# Patient Record
Sex: Female | Born: 1935 | Race: White | Hispanic: No | Smoking: Former smoker
Health system: Southern US, Community
[De-identification: ages and names within clinical notes are randomized; demographics above are authoritative.]

## PROBLEM LIST (undated history)

## (undated) DIAGNOSIS — M1990 Unspecified osteoarthritis, unspecified site: Secondary | ICD-10-CM

## (undated) DIAGNOSIS — N390 Urinary tract infection, site not specified: Secondary | ICD-10-CM

## (undated) DIAGNOSIS — M1710 Unilateral primary osteoarthritis, unspecified knee: Secondary | ICD-10-CM

## (undated) DIAGNOSIS — I10 Essential (primary) hypertension: Secondary | ICD-10-CM

## (undated) DIAGNOSIS — M21069 Valgus deformity, not elsewhere classified, unspecified knee: Secondary | ICD-10-CM

## (undated) DIAGNOSIS — S4292XA Fracture of left shoulder girdle, part unspecified, initial encounter for closed fracture: Secondary | ICD-10-CM

## (undated) DIAGNOSIS — N189 Chronic kidney disease, unspecified: Secondary | ICD-10-CM

## (undated) DIAGNOSIS — K8020 Calculus of gallbladder without cholecystitis without obstruction: Secondary | ICD-10-CM

## (undated) DIAGNOSIS — N950 Postmenopausal bleeding: Secondary | ICD-10-CM

## (undated) DIAGNOSIS — H269 Unspecified cataract: Secondary | ICD-10-CM

## (undated) DIAGNOSIS — M25562 Pain in left knee: Secondary | ICD-10-CM

## (undated) DIAGNOSIS — M810 Age-related osteoporosis without current pathological fracture: Secondary | ICD-10-CM

## (undated) DIAGNOSIS — N151 Renal and perinephric abscess: Secondary | ICD-10-CM

## (undated) DIAGNOSIS — G459 Transient cerebral ischemic attack, unspecified: Secondary | ICD-10-CM

## (undated) DIAGNOSIS — N840 Polyp of corpus uteri: Secondary | ICD-10-CM

## (undated) DIAGNOSIS — J069 Acute upper respiratory infection, unspecified: Secondary | ICD-10-CM

## (undated) DIAGNOSIS — N201 Calculus of ureter: Secondary | ICD-10-CM

## (undated) DIAGNOSIS — E785 Hyperlipidemia, unspecified: Secondary | ICD-10-CM

## (undated) DIAGNOSIS — T7840XA Allergy, unspecified, initial encounter: Secondary | ICD-10-CM

## (undated) HISTORY — DX: Chronic kidney disease, unspecified: N18.9

## (undated) HISTORY — PX: OTHER SURGICAL HISTORY: SHX169

## (undated) HISTORY — DX: Unilateral primary osteoarthritis, unspecified knee: M17.10

## (undated) HISTORY — DX: Renal and perinephric abscess: N15.1

## (undated) HISTORY — DX: Urinary tract infection, site not specified: N39.0

## (undated) HISTORY — DX: Calculus of ureter: N20.1

## (undated) HISTORY — DX: Calculus of gallbladder without cholecystitis without obstruction: K80.20

## (undated) HISTORY — DX: Essential (primary) hypertension: I10

## (undated) HISTORY — DX: Transient cerebral ischemic attack, unspecified: G45.9

## (undated) HISTORY — DX: Age-related osteoporosis without current pathological fracture: M81.0

## (undated) HISTORY — DX: Unspecified cataract: H26.9

## (undated) HISTORY — DX: Acute upper respiratory infection, unspecified: J06.9

## (undated) HISTORY — DX: Allergy, unspecified, initial encounter: T78.40XA

## (undated) HISTORY — DX: Hyperlipidemia, unspecified: E78.5

## (undated) HISTORY — DX: Postmenopausal bleeding: N95.0

## (undated) HISTORY — PX: EYE SURGERY: SHX253

## (undated) HISTORY — DX: Polyp of corpus uteri: N84.0

## (undated) HISTORY — DX: Unspecified osteoarthritis, unspecified site: M19.90

## (undated) HISTORY — PX: HERNIA REPAIR: SHX51

## (undated) HISTORY — DX: Valgus deformity, not elsewhere classified, unspecified knee: M21.069

## (undated) HISTORY — DX: Pain in left knee: M25.562

## (undated) HISTORY — DX: Fracture of left shoulder girdle, part unspecified, initial encounter for closed fracture: S42.92XA

---

## 1980-11-20 HISTORY — PX: OTHER SURGICAL HISTORY: SHX169

## 1981-11-20 HISTORY — PX: INCISIONAL HERNIA REPAIR: SHX193

## 1996-12-21 HISTORY — PX: OTHER SURGICAL HISTORY: SHX169

## 1997-05-20 DIAGNOSIS — N840 Polyp of corpus uteri: Secondary | ICD-10-CM | POA: Insufficient documentation

## 1997-05-20 HISTORY — DX: Polyp of corpus uteri: N84.0

## 1998-04-07 ENCOUNTER — Ambulatory Visit (HOSPITAL_COMMUNITY): Admission: RE | Admit: 1998-04-07 | Discharge: 1998-04-07 | Payer: Self-pay | Admitting: Obstetrics & Gynecology

## 1998-05-20 HISTORY — PX: OTHER SURGICAL HISTORY: SHX169

## 1998-08-20 HISTORY — PX: OTHER SURGICAL HISTORY: SHX169

## 1998-10-01 ENCOUNTER — Other Ambulatory Visit: Admission: RE | Admit: 1998-10-01 | Discharge: 1998-10-01 | Payer: Self-pay | Admitting: Family Medicine

## 2001-12-25 ENCOUNTER — Encounter: Admission: RE | Admit: 2001-12-25 | Discharge: 2001-12-25 | Payer: Self-pay | Admitting: Surgery

## 2001-12-25 ENCOUNTER — Encounter: Payer: Self-pay | Admitting: Surgery

## 2002-02-05 ENCOUNTER — Ambulatory Visit (HOSPITAL_COMMUNITY): Admission: RE | Admit: 2002-02-05 | Discharge: 2002-02-05 | Payer: Self-pay | Admitting: Surgery

## 2002-02-10 ENCOUNTER — Encounter: Payer: Self-pay | Admitting: Surgery

## 2002-02-18 ENCOUNTER — Inpatient Hospital Stay (HOSPITAL_COMMUNITY): Admission: EM | Admit: 2002-02-18 | Discharge: 2002-02-19 | Payer: Self-pay | Admitting: Surgery

## 2006-06-14 ENCOUNTER — Other Ambulatory Visit: Admission: RE | Admit: 2006-06-14 | Discharge: 2006-06-14 | Payer: Self-pay | Admitting: Family Medicine

## 2011-04-18 ENCOUNTER — Encounter: Payer: Self-pay | Admitting: Physician Assistant

## 2013-02-28 ENCOUNTER — Encounter: Payer: Self-pay | Admitting: Family Medicine

## 2013-02-28 ENCOUNTER — Ambulatory Visit (INDEPENDENT_AMBULATORY_CARE_PROVIDER_SITE_OTHER): Payer: Medicare Other | Admitting: Family Medicine

## 2013-02-28 VITALS — BP 139/73 | HR 62 | Temp 98.6°F | Ht 61.0 in | Wt 159.2 lb

## 2013-02-28 DIAGNOSIS — M810 Age-related osteoporosis without current pathological fracture: Secondary | ICD-10-CM | POA: Insufficient documentation

## 2013-02-28 DIAGNOSIS — M1710 Unilateral primary osteoarthritis, unspecified knee: Secondary | ICD-10-CM

## 2013-02-28 DIAGNOSIS — E785 Hyperlipidemia, unspecified: Secondary | ICD-10-CM

## 2013-02-28 DIAGNOSIS — M1712 Unilateral primary osteoarthritis, left knee: Secondary | ICD-10-CM | POA: Insufficient documentation

## 2013-02-28 DIAGNOSIS — M109 Gout, unspecified: Secondary | ICD-10-CM | POA: Insufficient documentation

## 2013-02-28 DIAGNOSIS — M21069 Valgus deformity, not elsewhere classified, unspecified knee: Secondary | ICD-10-CM

## 2013-02-28 DIAGNOSIS — I10 Essential (primary) hypertension: Secondary | ICD-10-CM

## 2013-02-28 LAB — LIPID PANEL
Cholesterol: 144 mg/dL (ref 0–200)
HDL: 47 mg/dL (ref >39)
LDL Cholesterol: 80 mg/dL (ref 0–99)
Total CHOL/HDL Ratio: 3.1 Ratio
Triglycerides: 84 mg/dL (ref <150)
VLDL: 17 mg/dL (ref 0–40)

## 2013-02-28 LAB — COMPLETE METABOLIC PANEL WITH GFR
ALT: 13 U/L (ref 0–35)
AST: 23 U/L (ref 0–37)
Albumin: 3.8 g/dL (ref 3.5–5.2)
Alkaline Phosphatase: 66 U/L (ref 39–117)
BUN: 29 mg/dL — ABNORMAL HIGH (ref 6–23)
CO2: 31 mEq/L (ref 19–32)
Calcium: 9.7 mg/dL (ref 8.4–10.5)
Chloride: 103 mEq/L (ref 96–112)
Creat: 0.92 mg/dL (ref 0.50–1.10)
GFR, Est African American: 64 mL/min
GFR, Est Non African American: 55 mL/min — ABNORMAL LOW
Glucose, Bld: 91 mg/dL (ref 70–99)
Potassium: 4.2 mEq/L (ref 3.5–5.3)
Sodium: 142 mEq/L (ref 135–145)
Total Bilirubin: 1 mg/dL (ref 0.3–1.2)
Total Protein: 6.6 g/dL (ref 6.0–8.3)

## 2013-02-28 LAB — URIC ACID: Uric Acid, Serum: 5.3 mg/dL (ref 2.4–6.0)

## 2013-02-28 MED ORDER — MELOXICAM 15 MG PO TABS: 15.0000 mg | ORAL_TABLET | Freq: Every day | ORAL | Status: DC

## 2013-02-28 MED ORDER — ALLOPURINOL 100 MG PO TABS: 100.0000 mg | ORAL_TABLET | Freq: Every day | ORAL | Status: DC

## 2013-02-28 MED ORDER — BENAZEPRIL HCL 40 MG PO TABS: 40.0000 mg | ORAL_TABLET | Freq: Every day | ORAL | Status: DC

## 2013-02-28 MED ORDER — ATENOLOL 50 MG PO TABS: 50.0000 mg | ORAL_TABLET | Freq: Two times a day (BID) | ORAL | Status: DC

## 2013-02-28 MED ORDER — LOVASTATIN 20 MG PO TABS: 20.0000 mg | ORAL_TABLET | Freq: Every day | ORAL | Status: DC

## 2013-02-28 MED ORDER — FUROSEMIDE 40 MG PO TABS: 40.0000 mg | ORAL_TABLET | Freq: Every day | ORAL | Status: DC

## 2013-02-28 MED ORDER — RALOXIFENE HCL 60 MG PO TABS: 60.0000 mg | ORAL_TABLET | Freq: Every day | ORAL | Status: DC

## 2013-02-28 MED ORDER — AMLODIPINE BESYLATE 5 MG PO TABS: 5.0000 mg | ORAL_TABLET | Freq: Every day | ORAL | Status: DC

## 2013-02-28 NOTE — Progress Notes (Signed)
Patient ID: Paige Ferguson, female   DOB: [birth year], 79 y.o.   MRN: [id number] SUBJECTIVE:   HPI:  Patient is here for follow up of hypertension: deniesHeadache;deniesChest Pain;deniesweakness;deniesShortness of Breath or Orthopnea;deniesVisual changes;deniespalpitations;deniescough;admits topedal edema , this is dependent edema. Uses compression stockings.;deniessymptoms of TIA or stroke; admits toCompliance with medications. deniesProblems with medications.  PMH/PSH: reviewed/updated in Epic  SH/FH: reviewed/updated in Epic  Allergies: reviewed/updated in Epic  Medications: reviewed/updated in Epic  Immunizations: reviewed/updated in Epic  ROS: As above in the HPI. All other systems are stable or negative.  OBJECTIVE:    APPEARANCE: Elderly, short statured white female Patient in no acute distress.The patient appeared well nourished and normally developed. Acyanotic.  Waist: Obese  VITAL SIGNS:BP 139/73  Pulse 62  Temp(Src) 98.6 F (37 C) (Oral)  Ht 5\' 1"  (1.549 m)  Wt 159 lb 3.2 oz (72.213 kg)  BMI 30.1 kg/m2   SKIN: warm and  Dry without overt rashes, tattoos and scars  HEAD and Neck: without JVD, Normal, carotids no bruits No scleral icterus  CHEST & LUNGS: Clear  CVS: Reveals the PMI to be normally located. Regular rhythm, First and Second Heart sounds are normal, and absence of murmurs, rubs or gallops.  ABDOMEN:  Benign,, no organomegaly, no masses, no Abdominal Aortic enlargement. No Guarding , no rebound. No Bruits.  RECTAL:n/a  GU:n/a  EXTREMETIES: 1+edematous. Wears compression stockings   MUSCULOSKELETAL:  Spine: Decreased range of motion of the spine Left knee has a knee brace on. Left knee genu valgum.  NEUROLOGIC: oriented to time,place and person; nonfocal. Ambulates with a walker  ASSESSMENT:  HTN (hypertension) - Plan: atenolol (TENORMIN) 50 MG tablet, amLODipine (NORVASC) 5 MG tablet, benazepril (LOTENSIN) 40 MG tablet, furosemide  (LASIX) 40 MG tablet, COMPLETE METABOLIC PANEL WITH GFR  Gout - Plan: allopurinol (ZYLOPRIM) 100 MG tablet, COMPLETE METABOLIC PANEL WITH GFR, Uric acid  Osteoporosis, unspecified - Plan: raloxifene (EVISTA) 60 MG tablet, COMPLETE METABOLIC PANEL WITH GFR  Arthritis of knee, left - Plan: meloxicam (MOBIC) 15 MG tablet, COMPLETE METABOLIC PANEL WITH GFR  Genu valgum (acquired) - Plan: meloxicam (MOBIC) 15 MG tablet  HLD (hyperlipidemia) - Plan: lovastatin (MEVACOR) 20 MG tablet, COMPLETE METABOLIC PANEL WITH GFR, Lipid panel    PLAN: Orders Placed This Encounter  Procedures  . COMPLETE METABOLIC PANEL WITH GFR  . Uric acid  . Lipid panel   No results found for this or any previous visit (from the past 24 hour(s)).  Meds ordered this encounter  Medications  . allopurinol (ZYLOPRIM) 100 MG tablet    Sig: Take 1 tablet (100 mg total) by mouth daily.    Dispense:  30 tablet    Refill:  6  . atenolol (TENORMIN) 50 MG tablet    Sig: Take 1 tablet (50 mg total) by mouth 2 (two) times daily.    Dispense:  60 tablet    Refill:  6  . amLODipine (NORVASC) 5 MG tablet    Sig: Take 1 tablet (5 mg total) by mouth daily.    Dispense:  30 tablet    Refill:  6  . benazepril (LOTENSIN) 40 MG tablet    Sig: Take 1 tablet (40 mg total) by mouth daily.    Dispense:  30 tablet    Refill:  6  . furosemide (LASIX) 40 MG tablet    Sig: Take 1 tablet (40 mg total) by mouth daily. Taken one and half tablet by muoth daily    Dispense:  30 tablet    Refill:  6  . lovastatin (MEVACOR) 20 MG tablet    Sig: Take 1 tablet (20 mg total) by mouth at bedtime.    Dispense:  30 tablet    Refill:  6  . meloxicam (MOBIC) 15 MG tablet    Sig: Take 1 tablet (15 mg total) by mouth daily.    Dispense:  30 tablet    Refill:  6  . raloxifene (EVISTA) 60 MG tablet    Sig: Take 1 tablet (60 mg total) by mouth daily.    Dispense:  30 tablet    Refill:  6   Discussed with patient her medical problems. Healthy  lifestyle and keep active, healthy diet.  Return to clinic in 3 months Continue same regimen  Layloni Fahrner P. [place] Charon, M.D.

## 2013-02-28 NOTE — Progress Notes (Signed)
Quick Note:  Lab result at goal. No change in Medications for now. No Change in plans and follow up. ______ 

## 2013-03-21 ENCOUNTER — Other Ambulatory Visit: Payer: Self-pay | Admitting: Physician Assistant

## 2013-06-05 ENCOUNTER — Ambulatory Visit (INDEPENDENT_AMBULATORY_CARE_PROVIDER_SITE_OTHER): Payer: Medicare Other | Admitting: Family Medicine

## 2013-06-05 ENCOUNTER — Encounter: Payer: Self-pay | Admitting: Family Medicine

## 2013-06-05 VITALS — BP 151/75 | HR 60 | Temp 97.4°F | Wt 161.4 lb

## 2013-06-05 DIAGNOSIS — Z905 Acquired absence of kidney: Secondary | ICD-10-CM | POA: Insufficient documentation

## 2013-06-05 DIAGNOSIS — I10 Essential (primary) hypertension: Secondary | ICD-10-CM

## 2013-06-05 DIAGNOSIS — E785 Hyperlipidemia, unspecified: Secondary | ICD-10-CM

## 2013-06-05 DIAGNOSIS — M109 Gout, unspecified: Secondary | ICD-10-CM

## 2013-06-05 DIAGNOSIS — M21069 Valgus deformity, not elsewhere classified, unspecified knee: Secondary | ICD-10-CM

## 2013-06-05 DIAGNOSIS — M810 Age-related osteoporosis without current pathological fracture: Secondary | ICD-10-CM

## 2013-06-05 DIAGNOSIS — M1710 Unilateral primary osteoarthritis, unspecified knee: Secondary | ICD-10-CM

## 2013-06-05 DIAGNOSIS — M1712 Unilateral primary osteoarthritis, left knee: Secondary | ICD-10-CM

## 2013-06-05 LAB — POCT UA - MICROSCOPIC ONLY
Bacteria, U Microscopic: NEGATIVE
Casts, Ur, LPF, POC: NEGATIVE
Crystals, Ur, HPF, POC: NEGATIVE
Mucus, UA: NEGATIVE
RBC, urine, microscopic: NEGATIVE
Yeast, UA: NEGATIVE

## 2013-06-05 LAB — COMPLETE METABOLIC PANEL WITH GFR
ALT: 13 U/L (ref 0–35)
AST: 24 U/L (ref 0–37)
Albumin: 4.1 g/dL (ref 3.5–5.2)
Alkaline Phosphatase: 69 U/L (ref 39–117)
BUN: 27 mg/dL — ABNORMAL HIGH (ref 6–23)
CO2: 33 mEq/L — ABNORMAL HIGH (ref 19–32)
Calcium: 9.8 mg/dL (ref 8.4–10.5)
Chloride: 103 mEq/L (ref 96–112)
Creat: 1.02 mg/dL (ref 0.50–1.10)
GFR, Est African American: 56 mL/min — ABNORMAL LOW
GFR, Est Non African American: 49 mL/min — ABNORMAL LOW
Glucose, Bld: 98 mg/dL (ref 70–99)
Potassium: 3.9 mEq/L (ref 3.5–5.3)
Sodium: 142 mEq/L (ref 135–145)
Total Bilirubin: 1 mg/dL (ref 0.3–1.2)
Total Protein: 7 g/dL (ref 6.0–8.3)

## 2013-06-05 LAB — LIPID PANEL
Cholesterol: 140 mg/dL (ref 0–200)
HDL: 41 mg/dL (ref >39)
LDL Cholesterol: 81 mg/dL (ref 0–99)
Total CHOL/HDL Ratio: 3.4 Ratio
Triglycerides: 90 mg/dL (ref <150)
VLDL: 18 mg/dL (ref 0–40)

## 2013-06-05 LAB — POCT URINALYSIS DIPSTICK
Bilirubin, UA: NEGATIVE
Blood, UA: NEGATIVE
Glucose, UA: NEGATIVE
Ketones, UA: NEGATIVE
Nitrite, UA: NEGATIVE
Protein, UA: NEGATIVE
Spec Grav, UA: <=1.005
Urobilinogen, UA: NEGATIVE
pH, UA: 7.5

## 2013-06-05 LAB — URIC ACID: Uric Acid, Serum: 5.1 mg/dL (ref 2.4–6.0)

## 2013-06-05 MED ORDER — FUROSEMIDE 40 MG PO TABS: 40.0000 mg | ORAL_TABLET | Freq: Every day | ORAL | Status: DC

## 2013-06-05 NOTE — Patient Instructions (Addendum)
Gout °Gout is an inflammatory condition (arthritis) caused by a buildup of uric acid crystals in the joints. Uric acid is a chemical that is normally present in the blood. Under some circumstances, uric acid can form into crystals in your joints. This causes joint redness, soreness, and swelling (inflammation). Repeat attacks are common. Over time, uric acid crystals can form into masses (tophi) near a joint, causing disfigurement. Gout is treatable and often preventable. °CAUSES  °The disease begins with elevated levels of uric acid in the blood. Uric acid is produced by your body when it breaks down a naturally found substance called purines. This also happens when you eat certain foods such as meats and fish. Causes of an elevated uric acid level include: °· Being passed down from parent to child (heredity). °· Diseases that cause increased uric acid production (obesity, psoriasis, some cancers). °· Excessive alcohol use. °· Diet, especially diets rich in meat and seafood. °· Medicines, including certain cancer-fighting drugs (chemotherapy), diuretics, and aspirin. °· Chronic kidney disease. The kidneys are no longer able to remove uric acid well. °· Problems with metabolism. °Conditions strongly associated with gout include: °· Obesity. °· High blood pressure. °· High cholesterol. °· Diabetes. °Not everyone with elevated uric acid levels gets gout. It is not understood why some people get gout and others do not. Surgery, joint injury, and eating too much of certain foods are some of the factors that can lead to gout. °SYMPTOMS  °· An attack of gout comes on quickly. It causes intense pain with redness, swelling, and warmth in a joint. °· Fever can occur. °· Often, only one joint is involved. Certain joints are more commonly involved: °· Base of the big toe. °· Knee. °· Ankle. °· Wrist. °· Finger. °Without treatment, an attack usually goes away in a few days to weeks. Between attacks, you usually will not have  symptoms, which is different from many other forms of arthritis. °DIAGNOSIS  °Your caregiver will suspect gout based on your symptoms and exam. Removal of fluid from the joint (arthrocentesis) is done to check for uric acid crystals. Your caregiver will give you a medicine that numbs the area (local anesthetic) and use a needle to remove joint fluid for exam. Gout is confirmed when uric acid crystals are seen in joint fluid, using a special microscope. Sometimes, blood, urine, and X-ray tests are also used. °TREATMENT  °There are 2 phases to gout treatment: treating the sudden onset (acute) attack and preventing attacks (prophylaxis). °Treatment of an Acute Attack °· Medicines are used. These include anti-inflammatory medicines or steroid medicines. °· An injection of steroid medicine into the affected joint is sometimes necessary. °· The painful joint is rested. Movement can worsen the arthritis. °· You may use warm or cold treatments on painful joints, depending which works best for you. °· Discuss the use of coffee, vitamin C, or cherries with your caregiver. These may be helpful treatment options. °Treatment to Prevent Attacks °After the acute attack subsides, your caregiver may advise prophylactic medicine. These medicines either help your kidneys eliminate uric acid from your body or decrease your uric acid production. You may need to stay on these medicines for a very long time. °The early phase of treatment with prophylactic medicine can be associated with an increase in acute gout attacks. For this reason, during the first few months of treatment, your caregiver may also advise you to take medicines usually used for acute gout treatment. Be sure you understand your caregiver's directions. °  You should also discuss dietary treatment with your caregiver. Certain foods such as meats and fish can increase uric acid levels. Other foods such as dairy can decrease levels. Your caregiver can give you a list of foods  to avoid. °HOME CARE INSTRUCTIONS  °· Do not take aspirin to relieve pain. This raises uric acid levels. °· Only take over-the-counter or prescription medicines for pain, discomfort, or fever as directed by your caregiver. °· Rest the joint as much as possible. When in bed, keep sheets and blankets off painful areas. °· Keep the affected joint raised (elevated). °· Use crutches if the painful joint is in your leg. °· Drink enough water and fluids to keep your urine clear or pale yellow. This helps your body get rid of uric acid. Do not drink alcoholic beverages. They slow the passage of uric acid. °· Follow your caregiver's dietary instructions. Pay careful attention to the amount of protein you eat. Your daily diet should emphasize fruits, vegetables, whole grains, and fat-free or low-fat milk products. °· Maintain a healthy body weight. °SEEK MEDICAL CARE IF:  °· You have an oral temperature above 102° F (38.9° C). °· You develop diarrhea, vomiting, or any side effects from medicines. °· You do not feel better in 24 hours, or you are getting worse. °SEEK IMMEDIATE MEDICAL CARE IF:  °· Your joint becomes suddenly more tender and you have: °· Chills. °· An oral temperature above 102° F (38.9° C), not controlled by medicine. °MAKE SURE YOU:  °· Understand these instructions. °· Will watch your condition. °· Will get help right away if you are not doing well or get worse. °Document Released: 11/03/2000 Document Revised: 01/29/2012 Document Reviewed: 02/14/2010 °[employer]® Patient Information ©2014 [employer]. ° °Purine Restricted Diet °A low-purine diet consists of foods that reduce uric acid made in your body. °INDICATIONS FOR USE  °Your caregiver may ask you to follow a low-purine diet to reduce gout flairs.  °GUIDELINES  °Avoid high-purine foods, including all alcohol, yeast extracts taken as supplements, and sauces made from meats (like gravy). Do not eat high-purine meats, including anchovies, sardines,  herring, mussels, tuna, codfish, scallops, trout, haddock, bacon, organ meats, tripe, goose, wild game, and sweetbreads.  °Grains °· Allowed/Recommended: All, except those listed to consume in moderation. °· Consume in Moderation: Oatmeal ( cup uncooked daily), wheat bran or germ (¼ cup daily), and whole grains. °Vegetables °· Allowed/Recommended: All, except those listed to consume in moderation. °· Consume in Moderation: Asparagus, cauliflower, spinach, mushrooms, and green peas (½ cup daily). °Fruit °· Allowed/Recommended: All. °· Consume in Moderation: None. °Meat and Meat Substitutes °· Allowed/Recommended: Eggs, nuts, and peanut butter. °· Consume in Moderation: Limit to 4 to 6 oz daily. Avoid high-purine meats. Lentils, peas, and dried beans (1 cup daily). °Milk °· Allowed/Recommended: All. Choose low-fat or skim when possible. °· Consume in Moderation: None. °Fats and Oils °· Allowed/Recommended: All. °· Consume in Moderation: None. °Beverages °· Allowed/Recommended: All, except those listed to avoid. °· Avoid: All alcohol. °Condiments/Miscellaneous °· Allowed/Recommended: All, except those listed to consume in moderation. °· Consume in Moderation: Bouillon and meat-based broths and soups. °Document Released: 03/03/2011 Document Revised: 01/29/2012 Document Reviewed: 03/03/2011 °[employer]® Patient Information ©2014 [employer]. ° °

## 2013-06-05 NOTE — Progress Notes (Signed)
Patient ID: Paige Ferguson, female   DOB: [birth year], 79 y.o.   MRN: [id number] SUBJECTIVE: CC: Chief Complaint  Patient presents with  . Follow-up    3 month follow up fastng  no complaints refill lasix    HPI: Patient is here for follow up of hypertension/hyperlipidemia/gout/osteoporosis: denies Headache;deniesChest Pain;denies weakness;denies Shortness of Breath or Orthopnea;denies Visual changes;denies palpitations;denies cough;denies pedal edema;denies symptoms of TIA or stroke; admits to Compliance with medications. denies Problems with medications. Knee arthritis about the same. Hand arthritis responds to putting hands in warm water. SBP usually 135 at home  Ambulates with a rolling walker. Doing well. No depression symptoms and no falls. Has to use a rolling walker.  Past Medical History  Diagnosis Date  . Recurrent UTI   . Ureteral calculi   . Hypertension   . Renal abscess, right   . Cholelithiases   . Postmenopausal bleeding   . Endometrial polyp 7/98    2 degree polyps   . Left knee pain   . Chronic kidney disease   . Osteoarthritis   . Gout   . Hyperlipidemia   . URI (upper respiratory infection)   . Arthritis of knee   . Genu valgum    Past Surgical History  Procedure Laterality Date  . Right nephrelithotomy  1982  . Incisional hernia repair  1983  . Right heminephrectomy for abscess  2/98  . Hysteroscopy , fractional d&c  7/98/  . Right cataract surgery  7/99    Dr. Jettie Pagan   . Left cataract surgery  10/99   History   Social History  . Marital Status: Widowed    Spouse Name: N/A    Number of Children: N/A  . Years of Education: N/A   Occupational History  . Not on file.   Social History Main Topics  . Smoking status: Former Smoker    Types: Cigarettes    Quit date: 11/20/1944  . Smokeless tobacco: Not on file  . Alcohol Use: No  . Drug Use: No  . Sexually Active: Not on file   Other Topics Concern  . Not on file   Social History  Narrative   WIDOWED   3 CHILDREN 1 DECEASED   Family History  Problem Relation Age of Onset  . Stroke Maternal Grandmother   . Coronary artery disease Paternal Grandfather    Current Outpatient Prescriptions on File Prior to Visit  Medication Sig Dispense Refill  . allopurinol (ZYLOPRIM) 100 MG tablet Take 1 tablet (100 mg total) by mouth daily.  30 tablet  6  . amLODipine (NORVASC) 5 MG tablet Take 1 tablet (5 mg total) by mouth daily.  30 tablet  6  . atenolol (TENORMIN) 50 MG tablet TAKE  (1)  TABLET TWICE A DAY.  60 tablet  1  . benazepril (LOTENSIN) 40 MG tablet Take 1 tablet (40 mg total) by mouth daily.  30 tablet  6  . EVISTA 60 MG tablet TAKE 1 TABLET DAILY  30 tablet  1  . furosemide (LASIX) 40 MG tablet Take 1 tablet (40 mg total) by mouth daily. Taken one and half tablet by muoth daily  30 tablet  6  . lovastatin (MEVACOR) 20 MG tablet Take 1 tablet (20 mg total) by mouth at bedtime.  30 tablet  6  . meloxicam (MOBIC) 15 MG tablet Take 1 tablet (15 mg total) by mouth daily.  30 tablet  6  . Misc. Devices (STEP N REST II WALKER) MISC by  Does not apply route daily. PT HAS RX. FOR ROLLING WALKER WITH SEAT AND HANDBRAKES .        No current facility-administered medications on file prior to visit.   Allergies  Allergen Reactions  . Amoxicillin   . Nitrofurantoin Monohyd Macro   . Sulfa Antibiotics    Immunization History  Administered Date(s) Administered  . Pneumococcal Polysaccharide 01/19/2007   Prior to Admission medications   Medication Sig Start Date End Date Taking? Authorizing Provider  allopurinol (ZYLOPRIM) 100 MG tablet Take 1 tablet (100 mg total) by mouth daily. 02/28/13  Yes Ileana Ladd, MD  amLODipine (NORVASC) 5 MG tablet Take 1 tablet (5 mg total) by mouth daily. 02/28/13  Yes Ileana Ladd, MD  atenolol (TENORMIN) 50 MG tablet TAKE  (1)  TABLET TWICE A DAY. 03/21/13  Yes Ernestina Penna, MD  benazepril (LOTENSIN) 40 MG tablet Take 1 tablet (40 mg total)  by mouth daily. 02/28/13  Yes Ileana Ladd, MD  EVISTA 60 MG tablet TAKE 1 TABLET DAILY 03/21/13  Yes Ernestina Penna, MD  fexofenadine (ALLEGRA) 30 MG tablet Take 30 mg by mouth daily. Over the counter   Yes Historical Provider, MD  furosemide (LASIX) 40 MG tablet Take 1 tablet (40 mg total) by mouth daily. Taken one and half tablet by muoth daily 02/28/13  Yes Ileana Ladd, MD  lovastatin (MEVACOR) 20 MG tablet Take 1 tablet (20 mg total) by mouth at bedtime. 02/28/13  Yes Ileana Ladd, MD  meloxicam (MOBIC) 15 MG tablet Take 1 tablet (15 mg total) by mouth daily. 02/28/13  Yes Ileana Ladd, MD  Misc. Devices (STEP N REST II WALKER) MISC by Does not apply route daily. PT HAS RX. FOR ROLLING WALKER WITH SEAT AND HANDBRAKES .    Yes Historical Provider, MD     ROS: As above in the HPI. All other systems are stable or negative.  OBJECTIVE: APPEARANCE:  Patient in no acute distress.The patient appeared well nourished and normally developed. Acyanotic. Waist: VITAL SIGNS:BP 151/75  Pulse 60  Temp(Src) 97.4 F (36.3 C) (Oral)  Wt 161 lb 6.4 oz (73.211 kg)  BMI 30.51 kg/m2 Elderly WF BP 150/75  SKIN: warm and  Dry without overt rashes, tattoos and scars  HEAD and Neck: without JVD, Head and scalp: normal Eyes:No scleral icterus. Fundi normal, eye movements normal. Ears: Auricle normal, canal normal, Tympanic membranes normal, insufflation normal. Nose: normal Throat: normal Neck & thyroid: normal  CHEST & LUNGS: Chest wall: normal Lungs: Clear  CVS: Reveals the PMI to be normally located. Regular rhythm, First and Second Heart sounds are normal,  absence of murmurs, rubs or gallops. Peripheral vasculature: Radial pulses: normal Dorsal pedis pulses: normal Posterior pulses: normal  ABDOMEN:  Appearance: obese Benign, no organomegaly, no masses, no Abdominal Aortic enlargement. No Guarding , no rebound. No Bruits. Bowel sounds: normal  RECTAL: N/A GU:  N/A  EXTREMETIES: nonedematous. Both Femoral and Pedal pulses are normal.  MUSCULOSKELETAL:  Spine: normal Joints: OA of both knees and fingers of the hands. Genu valgum both knees  NEUROLOGIC: oriented to time,place and person; nonfocal.  Psychiatry: no depression  ASSESSMENT: HTN (hypertension) - Plan: COMPLETE METABOLIC PANEL WITH GFR, furosemide (LASIX) 40 MG tablet  Osteoporosis, unspecified  Gout - Plan: Uric acid  Arthritis of knee, left  Genu valgum (acquired)  Single kidney - Plan: POCT UA - Microscopic Only, POCT urinalysis dipstick, Urine culture  HLD (hyperlipidemia) - Plan: Lipid  panel  PLAN:  Orders Placed This Encounter  Procedures  . Urine culture  . COMPLETE METABOLIC PANEL WITH GFR  . Uric acid  . Lipid panel  . POCT UA - Microscopic Only  . POCT urinalysis dipstick   Meds ordered this encounter  Medications  . DISCONTD: raloxifene (EVISTA) 60 MG tablet    Sig:   . fexofenadine (ALLEGRA) 30 MG tablet    Sig: Take 30 mg by mouth daily. Over the counter  . furosemide (LASIX) 40 MG tablet    Sig: Take 1 tablet (40 mg total) by mouth daily. Taken one and half tablet by muoth daily    Dispense:  45 tablet    Refill:  6   Low Purine diet and gout handouts in the AVS  No change in medications and activities.  Return in about 3 months (around 09/05/2013) for Recheck medical problems.  Varun Jourdan P. [place] Charon, M.D.

## 2013-06-06 LAB — URINE CULTURE: Colony Count: 50000

## 2013-06-07 ENCOUNTER — Other Ambulatory Visit: Payer: Self-pay | Admitting: Family Medicine

## 2013-06-07 DIAGNOSIS — Z905 Acquired absence of kidney: Secondary | ICD-10-CM

## 2013-06-07 NOTE — Progress Notes (Signed)
Quick Note:  Lab result at goal.except the urine appears not to be a midstream catch because it grew many different bacteria. Will need to repeat in the next couple of weeks and it needs to be a midstream clean catch. No change in Medications for now. No Change in plans and follow up otherwise. ______

## 2013-06-20 ENCOUNTER — Other Ambulatory Visit (INDEPENDENT_AMBULATORY_CARE_PROVIDER_SITE_OTHER): Payer: Medicare Other

## 2013-06-20 DIAGNOSIS — Z905 Acquired absence of kidney: Secondary | ICD-10-CM

## 2013-06-20 NOTE — Progress Notes (Signed)
Patient came in for labs only.

## 2013-06-22 LAB — URINE CULTURE: Colony Count: 45000

## 2013-06-23 ENCOUNTER — Other Ambulatory Visit: Payer: Self-pay | Admitting: Family Medicine

## 2013-06-23 ENCOUNTER — Encounter: Payer: Self-pay | Admitting: *Deleted

## 2013-06-23 DIAGNOSIS — N390 Urinary tract infection, site not specified: Secondary | ICD-10-CM

## 2013-06-23 MED ORDER — CIPROFLOXACIN HCL 250 MG PO TABS: 250.0000 mg | ORAL_TABLET | Freq: Two times a day (BID) | ORAL | Status: DC

## 2013-06-23 NOTE — Progress Notes (Signed)
Quick Note:  Labs abnormal. Urine still has a few bacteria. Will treat because she has a single kidney. Recheck UA middle of the stream in 4 weeks. Rx and lab orderes placed in EPIC. ______

## 2013-07-25 ENCOUNTER — Other Ambulatory Visit (INDEPENDENT_AMBULATORY_CARE_PROVIDER_SITE_OTHER): Payer: Medicare Other

## 2013-07-25 DIAGNOSIS — N390 Urinary tract infection, site not specified: Secondary | ICD-10-CM

## 2013-07-26 LAB — URINE CULTURE: Organism ID, Bacteria: NO GROWTH

## 2013-07-27 NOTE — Progress Notes (Signed)
Quick Note:  Call patient. Labs normal.no bacteria in urine. No change in plan. ______

## 2013-09-11 ENCOUNTER — Ambulatory Visit: Payer: Medicare Other | Admitting: Family Medicine

## 2013-09-15 ENCOUNTER — Ambulatory Visit (INDEPENDENT_AMBULATORY_CARE_PROVIDER_SITE_OTHER): Payer: Medicare Other | Admitting: Family Medicine

## 2013-09-15 ENCOUNTER — Encounter: Payer: Self-pay | Admitting: Family Medicine

## 2013-09-15 VITALS — BP 148/70 | HR 64 | Temp 97.1°F | Ht 61.0 in | Wt 154.6 lb

## 2013-09-15 DIAGNOSIS — R059 Cough, unspecified: Secondary | ICD-10-CM

## 2013-09-15 DIAGNOSIS — M21069 Valgus deformity, not elsewhere classified, unspecified knee: Secondary | ICD-10-CM

## 2013-09-15 DIAGNOSIS — J309 Allergic rhinitis, unspecified: Secondary | ICD-10-CM

## 2013-09-15 DIAGNOSIS — Z23 Encounter for immunization: Secondary | ICD-10-CM

## 2013-09-15 DIAGNOSIS — M109 Gout, unspecified: Secondary | ICD-10-CM

## 2013-09-15 DIAGNOSIS — R05 Cough: Secondary | ICD-10-CM

## 2013-09-15 DIAGNOSIS — Z905 Acquired absence of kidney: Secondary | ICD-10-CM

## 2013-09-15 DIAGNOSIS — M1712 Unilateral primary osteoarthritis, left knee: Secondary | ICD-10-CM

## 2013-09-15 DIAGNOSIS — M810 Age-related osteoporosis without current pathological fracture: Secondary | ICD-10-CM

## 2013-09-15 DIAGNOSIS — I10 Essential (primary) hypertension: Secondary | ICD-10-CM

## 2013-09-15 DIAGNOSIS — E785 Hyperlipidemia, unspecified: Secondary | ICD-10-CM

## 2013-09-15 DIAGNOSIS — J302 Other seasonal allergic rhinitis: Secondary | ICD-10-CM

## 2013-09-15 DIAGNOSIS — M1710 Unilateral primary osteoarthritis, unspecified knee: Secondary | ICD-10-CM

## 2013-09-15 MED ORDER — ATENOLOL 50 MG PO TABS: ORAL_TABLET | ORAL | Status: DC

## 2013-09-15 MED ORDER — FUROSEMIDE 40 MG PO TABS: 40.0000 mg | ORAL_TABLET | Freq: Every day | ORAL | Status: DC

## 2013-09-15 MED ORDER — ALLOPURINOL 100 MG PO TABS: 100.0000 mg | ORAL_TABLET | Freq: Every day | ORAL | Status: DC

## 2013-09-15 MED ORDER — AMLODIPINE BESYLATE 5 MG PO TABS: 5.0000 mg | ORAL_TABLET | Freq: Every day | ORAL | Status: DC

## 2013-09-15 MED ORDER — BENAZEPRIL HCL 40 MG PO TABS: 40.0000 mg | ORAL_TABLET | Freq: Every day | ORAL | Status: DC

## 2013-09-15 MED ORDER — HYDROCODONE-HOMATROPINE 5-1.5 MG/5ML PO SYRP: 5.0000 mL | ORAL_SOLUTION | Freq: Every day | ORAL | Status: DC

## 2013-09-15 MED ORDER — MELOXICAM 15 MG PO TABS: 15.0000 mg | ORAL_TABLET | Freq: Every day | ORAL | Status: DC

## 2013-09-15 MED ORDER — LOVASTATIN 20 MG PO TABS: 20.0000 mg | ORAL_TABLET | Freq: Every day | ORAL | Status: DC

## 2013-09-15 NOTE — Patient Instructions (Signed)
DASH Diet The DASH diet stands for "Dietary Approaches to Stop Hypertension." It is a healthy eating plan that has been shown to reduce high blood pressure (hypertension) in as little as 14 days, while also possibly providing other significant health benefits. These other health benefits include reducing the risk of breast cancer after menopause and reducing the risk of type 2 diabetes, heart disease, colon cancer, and stroke. Health benefits also include weight loss and slowing kidney failure in patients with chronic kidney disease.  DIET GUIDELINES  Limit salt (sodium). Your diet should contain less than 1500 mg of sodium daily.  Limit refined or processed carbohydrates. Your diet should include mostly whole grains. Desserts and added sugars should be used sparingly.  Include small amounts of heart-healthy fats. These types of fats include nuts, oils, and tub margarine. Limit saturated and trans fats. These fats have been shown to be harmful in the body. CHOOSING FOODS  The following food groups are based on a 2000 calorie diet. See your Registered Dietitian for individual calorie needs. Grains and Grain Products (6 to 8 servings daily)  Eat More Often: Whole-wheat bread, brown rice, whole-grain or wheat pasta, quinoa, popcorn without added fat or salt (air popped).  Eat Less Often: White bread, white pasta, white rice, cornbread. Vegetables (4 to 5 servings daily)  Eat More Often: Fresh, frozen, and canned vegetables. Vegetables may be raw, steamed, roasted, or grilled with a minimal amount of fat.  Eat Less Often/Avoid: Creamed or fried vegetables. Vegetables in a cheese sauce. Fruit (4 to 5 servings daily)  Eat More Often: All fresh, canned (in natural juice), or frozen fruits. Dried fruits without added sugar. One hundred percent fruit juice ( cup [237 mL] daily).  Eat Less Often: Dried fruits with added sugar. Canned fruit in light or heavy syrup. [employer], Fish, and Poultry (2  servings or less daily. One serving is 3 to 4 oz [85-114 g]).  Eat More Often: Ninety percent or leaner ground beef, tenderloin, sirloin. Round cuts of beef, chicken breast, [place] breast. All fish. Grill, bake, or broil your meat. Nothing should be fried.  Eat Less Often/Avoid: Fatty cuts of meat, [place], or chicken leg, thigh, or wing. Fried cuts of meat or fish. Dairy (2 to 3 servings)  Eat More Often: Low-fat or fat-free milk, low-fat plain or light yogurt, reduced-fat or part-skim cheese.  Eat Less Often/Avoid: Milk (whole, 2%).Whole milk yogurt. Full-fat cheeses. Nuts, Seeds, and Legumes (4 to 5 servings per week)  Eat More Often: All without added salt.  Eat Less Often/Avoid: Salted nuts and seeds, canned beans with added salt. Fats and Sweets (limited)  Eat More Often: Vegetable oils, tub margarines without trans fats, sugar-free gelatin. Mayonnaise and salad dressings.  Eat Less Often/Avoid: Coconut oils, palm oils, butter, stick margarine, cream, half and half, cookies, candy, pie. FOR MORE INFORMATION The Dash Diet Eating Plan: www.dashdiet.org Document Released: 10/26/2011 Document Revised: 01/29/2012 Document Reviewed: 10/26/2011 [facility] Patient Information 2014 [place], [place].  Fall Prevention, Elderly Falls are the leading cause of injuries, accidents, and accidental deaths in people over the age of 62. Falling is a real threat to your ability to live on your own. CAUSES   Poor eyesight or poor hearing can make you more likely to fall.   Illnesses and physical conditions can affect your strength and balance.   Poor lighting, throw rugs and pets in your home can make you more likely to trip or slip.   The side effects of  some medicines can upset your balance and lead to falling. These include medicines for depression, sleep problems, high blood pressure, diabetes, and heart conditions.  PREVENTION  Be sure your home is as safe as possible. Here are some  tips:  Wear shoes with non-skid soles (not house slippers).   Be sure your home and outside area are well lit.   Use night lights throughout your house, including hallways and stairways.   Remove clutter and clean up spills on floors and walkways.   Remove throw rugs or fasten them to the floor with carpet tape. Tack down carpet edges.   Do not place electrical cords across pathways.   Install grab bars in your bathtub, shower, and toilet area. Towel bars should not be used as a grab bar.   Install handrails on both sides of stairways.   Do not climb on stools or stepladders. Get someone else to help with jobs that require climbing.   Do not wax your floors at all, or use a non-skid wax.   Repair uneven or unsafe sidewalks, walkways or stairs.   Keep frequently used items within reach.   Be aware of pets so you do not trip.  Get regular check-ups from your doctor, and take good care of yourself:  Have your eyes checked every year for vision changes, cataracts, glaucoma, and other eye problems. Wear eyeglasses as directed.   Have your hearing checked every 2 years, or anytime you or others think that you cannot hear well. Use hearing aids as directed.   See your caregiver if you have foot pain or corns. Sore feet can contribute to falls.   Let your caregiver know if a medicine is making you feel dizzy or making you lose your balance.   Use a cane, walker, or wheelchair as directed. Use walker or wheelchair brakes when getting in and out.   When you get up from bed, sit on the side of the bed for 1 to 2 minutes before you stand up. This will give your blood pressure time to adjust, and you will feel less dizzy.   If you need to go to the bathroom often, consider using a bedside commode.  Keep your body in good shape:  Get regular exercise, especially walking.   Do exercises to strengthen the muscles you use for walking and lifting.   Do not smoke.   Minimize use of  alcohol.  SEEK IMMEDIATE MEDICAL CARE IF:   You feel dizzy, weak, or unsteady on your feet.   You feel confused.   You fall.  Document Released: 11/06/2005 Document Revised: 10/26/2011 Document Reviewed: 05/03/2007 [facility] Patient Information 2012 [place], [place].

## 2013-09-15 NOTE — Progress Notes (Signed)
Patient ID: Paige Ferguson, female   DOB: [birth year], 79 y.o.   MRN: [id number] SUBJECTIVE: CC: Chief Complaint  Patient presents with  . Follow-up    78month   fasting  wants flu shot  . Medication Refill    atenolol allopurinol amlodipine brenazapril meloxical and lovastatin     HPI:  Recently had a allergy flare up several weeks ago. Had to take some cough medicines. They were mowing the grass around her home. She used the hycodan syrup and it  Worked. No problems  No sedation. She wanted a refill to have on hand because it helps with cough.  Patient is here for follow up of hypertension/gout/osteoporosis/arthritis: denies Headache;deniesChest Pain;denies weakness;denies Shortness of Breath or Orthopnea;denies Visual changes;denies palpitations;denies cough;denies pedal edema;denies symptoms of TIA or stroke; admits to Compliance with medications. denies Problems with medications.  Past Medical History  Diagnosis Date  . Recurrent UTI   . Ureteral calculi   . Hypertension   . Renal abscess, right   . Cholelithiases   . Postmenopausal bleeding   . Endometrial polyp 7/98    2 degree polyps   . Left knee pain   . Chronic kidney disease   . Osteoarthritis   . Gout   . Hyperlipidemia   . URI (upper respiratory infection)   . Arthritis of knee   . Genu valgum    Past Surgical History  Procedure Laterality Date  . Right nephrelithotomy  1982  . Incisional hernia repair  1983  . Right heminephrectomy for abscess  2/98  . Hysteroscopy , fractional d&c  7/98/  . Right cataract surgery  7/99    Dr. Jettie Pagan   . Left cataract surgery  10/99   History   Social History  . Marital Status: Widowed    Spouse Name: N/A    Number of Children: N/A  . Years of Education: N/A   Occupational History  . Not on file.   Social History Main Topics  . Smoking status: Former Smoker    Types: Cigarettes    Quit date: 11/20/1944  . Smokeless tobacco: Not on file  . Alcohol Use: No  .  Drug Use: No  . Sexual Activity: Not on file   Other Topics Concern  . Not on file   Social History Narrative   WIDOWED   3 CHILDREN 1 DECEASED   Family History  Problem Relation Age of Onset  . Stroke Maternal Grandmother   . Coronary artery disease Paternal Grandfather    Current Outpatient Prescriptions on File Prior to Visit  Medication Sig Dispense Refill  . EVISTA 60 MG tablet TAKE 1 TABLET DAILY  30 tablet  1  . fexofenadine (ALLEGRA) 30 MG tablet Take 30 mg by mouth daily. Over the counter      . Misc. Devices (STEP N REST II WALKER) MISC by Does not apply route daily. PT HAS RX. FOR ROLLING WALKER WITH SEAT AND HANDBRAKES .        No current facility-administered medications on file prior to visit.   Allergies  Allergen Reactions  . Amoxicillin   . Nitrofurantoin Monohyd Macro   . Sulfa Antibiotics    Immunization History  Administered Date(s) Administered  . Pneumococcal Polysaccharide 01/19/2007   Prior to Admission medications   Medication Sig Start Date End Date Taking? Authorizing Provider  allopurinol (ZYLOPRIM) 100 MG tablet Take 1 tablet (100 mg total) by mouth daily. 02/28/13  Yes Ileana Ladd, MD  amLODipine (NORVASC) 5  MG tablet Take 1 tablet (5 mg total) by mouth daily. 02/28/13  Yes Ileana Ladd, MD  atenolol (TENORMIN) 50 MG tablet TAKE  (1)  TABLET TWICE A DAY. 03/21/13  Yes Ernestina Penna, MD  benazepril (LOTENSIN) 40 MG tablet Take 1 tablet (40 mg total) by mouth daily. 02/28/13  Yes Ileana Ladd, MD  ciprofloxacin (CIPRO) 250 MG tablet Take 1 tablet (250 mg total) by mouth 2 (two) times daily. 06/23/13  Yes Ileana Ladd, MD  EVISTA 60 MG tablet TAKE 1 TABLET DAILY 03/21/13  Yes Ernestina Penna, MD  fexofenadine (ALLEGRA) 30 MG tablet Take 30 mg by mouth daily. Over the counter   Yes Historical Provider, MD  furosemide (LASIX) 40 MG tablet Take 1 tablet (40 mg total) by mouth daily. Taken one and half tablet by muoth daily 06/05/13  Yes Ileana Ladd,  MD  lovastatin (MEVACOR) 20 MG tablet Take 1 tablet (20 mg total) by mouth at bedtime. 02/28/13  Yes Ileana Ladd, MD  meloxicam (MOBIC) 15 MG tablet Take 1 tablet (15 mg total) by mouth daily. 02/28/13  Yes Ileana Ladd, MD  Misc. Devices (STEP N REST II WALKER) MISC by Does not apply route daily. PT HAS RX. FOR ROLLING WALKER WITH SEAT AND HANDBRAKES .    Yes Historical Provider, MD     ROS: As above in the HPI. All other systems are stable or negative.  OBJECTIVE: APPEARANCE:  Patient in no acute distress.The patient appeared well nourished and normally developed. Acyanotic. Waist: VITAL SIGNS:BP 148/70  Pulse 64  Temp(Src) 97.1 F (36.2 C) (Oral)  Ht 5\' 1"  (1.549 m)  Wt 154 lb 9.6 oz (70.126 kg)  BMI 29.23 kg/m2 WF elderly Ambulates with a walker. SKIN: warm and  Dry without overt rashes, tattoos and scars  HEAD and Neck: without JVD, Head and scalp: normal Eyes:No scleral icterus. Fundi normal, eye movements normal. Ears: Auricle normal, canal normal, Tympanic membranes normal, insufflation normal. Nose: normal Throat: normal Neck & thyroid: normal  CHEST & LUNGS: Chest wall: normal Lungs: Clear  CVS: Reveals the PMI to be normally located. Regular rhythm, First and Second Heart sounds are normal,  absence of murmurs, rubs or gallops. Peripheral vasculature: Radial pulses: normal Dorsal pedis pulses: normal Posterior pulses: normal  ABDOMEN:  Appearance: normal Benign, no organomegaly, no masses, no Abdominal Aortic enlargement. No Guarding , no rebound. No Bruits. Bowel sounds: normal  RECTAL: N/A GU: N/A  EXTREMETIES: nonedematous.  MUSCULOSKELETAL:  Spine: mild kyphosis Joints: Genu Valgum  NEUROLOGIC: oriented to time,place and person; nonfocal. Strength is normal Sensory is normal Reflexes are normal Cranial Nerves are normal.  ASSESSMENT: HTN (hypertension) - Plan: furosemide (LASIX) 40 MG tablet, benazepril (LOTENSIN) 40 MG tablet,  amLODipine (NORVASC) 5 MG tablet, atenolol (TENORMIN) 50 MG tablet, CMP14+EGFR  Gout - Plan: allopurinol (ZYLOPRIM) 100 MG tablet, CMP14+EGFR, Uric acid  Arthritis of knee, left - Plan: meloxicam (MOBIC) 15 MG tablet  Genu valgum (acquired) - Plan: meloxicam (MOBIC) 15 MG tablet  Osteoporosis, unspecified  Single kidney  HLD (hyperlipidemia) - Plan: lovastatin (MEVACOR) 20 MG tablet, CMP14+EGFR, Lipid panel  Seasonal allergic rhinitis  Cough - Plan: HYDROcodone-homatropine (HYCODAN) 5-1.5 MG/5ML syrup  PLAN:  Orders Placed This Encounter  Procedures  . CMP14+EGFR  . Lipid panel  . Uric acid   Meds ordered this encounter  Medications  . meloxicam (MOBIC) 15 MG tablet    Sig: Take 1 tablet (15 mg total) by  mouth daily.    Dispense:  30 tablet    Refill:  6  . allopurinol (ZYLOPRIM) 100 MG tablet    Sig: Take 1 tablet (100 mg total) by mouth daily.    Dispense:  30 tablet    Refill:  6  . lovastatin (MEVACOR) 20 MG tablet    Sig: Take 1 tablet (20 mg total) by mouth at bedtime.    Dispense:  30 tablet    Refill:  6  . furosemide (LASIX) 40 MG tablet    Sig: Take 1 tablet (40 mg total) by mouth daily. Taken one and half tablet by muoth daily    Dispense:  45 tablet    Refill:  6  . benazepril (LOTENSIN) 40 MG tablet    Sig: Take 1 tablet (40 mg total) by mouth daily.    Dispense:  30 tablet    Refill:  6  . amLODipine (NORVASC) 5 MG tablet    Sig: Take 1 tablet (5 mg total) by mouth daily.    Dispense:  30 tablet    Refill:  6  . atenolol (TENORMIN) 50 MG tablet    Sig: TAKE  (1)  TABLET TWICE A DAY.    Dispense:  60 tablet    Refill:  6  . HYDROcodone-homatropine (HYCODAN) 5-1.5 MG/5ML syrup    Sig: Take 5 mLs by mouth at bedtime.    Dispense:  30 mL    Refill:  0   Medications Discontinued During This Encounter  Medication Reason  . ciprofloxacin (CIPRO) 250 MG tablet Completed Course  . meloxicam (MOBIC) 15 MG tablet Reorder  . allopurinol (ZYLOPRIM)  100 MG tablet Reorder  . lovastatin (MEVACOR) 20 MG tablet Reorder  . furosemide (LASIX) 40 MG tablet Reorder  . benazepril (LOTENSIN) 40 MG tablet Reorder  . amLODipine (NORVASC) 5 MG tablet Reorder  . atenolol (TENORMIN) 50 MG tablet Reorder    Caution with the hycodan cough medication. Discussed.  DASH Diet jhandout in the AVS, discussed.  Return in about 4 months (around 01/16/2014) for Recheck medical problems.  Roxy Filler P. [place] Charon, M.D.

## 2013-09-16 LAB — CMP14+EGFR
ALT: 13 IU/L (ref 0–32)
AST: 24 IU/L (ref 0–40)
Albumin/Globulin Ratio: 1.7 (ref 1.1–2.5)
Albumin: 4.2 g/dL (ref 3.2–4.6)
Alkaline Phosphatase: 77 IU/L (ref 39–117)
BUN/Creatinine Ratio: 25 (ref 11–26)
BUN: 28 mg/dL (ref 10–36)
CO2: 27 mmol/L (ref 18–29)
Calcium: 9.9 mg/dL (ref 8.6–10.2)
Chloride: 101 mmol/L (ref 97–108)
Creatinine, Ser: 1.1 mg/dL — ABNORMAL HIGH (ref 0.57–1.00)
GFR calc Af Amer: 51 mL/min/{1.73_m2} — ABNORMAL LOW (ref >59)
GFR calc non Af Amer: 44 mL/min/{1.73_m2} — ABNORMAL LOW (ref >59)
Globulin, Total: 2.5 g/dL (ref 1.5–4.5)
Glucose: 102 mg/dL — ABNORMAL HIGH (ref 65–99)
Potassium: 4.5 mmol/L (ref 3.5–5.2)
Sodium: 143 mmol/L (ref 134–144)
Total Bilirubin: 0.6 mg/dL (ref 0.0–1.2)
Total Protein: 6.7 g/dL (ref 6.0–8.5)

## 2013-09-16 LAB — LIPID PANEL
Chol/HDL Ratio: 3.7 ratio units (ref 0.0–4.4)
Cholesterol, Total: 149 mg/dL (ref 100–199)
HDL: 40 mg/dL (ref >39)
LDL Calculated: 90 mg/dL (ref 0–99)
Triglycerides: 93 mg/dL (ref 0–149)
VLDL Cholesterol Cal: 19 mg/dL (ref 5–40)

## 2013-09-16 LAB — URIC ACID: Uric Acid: 5.2 mg/dL (ref 2.5–7.1)

## 2013-11-05 NOTE — Addendum Note (Signed)
Addended by: Ardine Eng A on: 11/05/2013 11:55 AM   Modules accepted: Orders

## 2013-12-23 ENCOUNTER — Encounter: Payer: Self-pay | Admitting: Family Medicine

## 2013-12-23 ENCOUNTER — Ambulatory Visit (INDEPENDENT_AMBULATORY_CARE_PROVIDER_SITE_OTHER): Payer: Medicare Other | Admitting: Family Medicine

## 2013-12-23 VITALS — BP 184/90 | HR 65 | Temp 97.8°F | Ht 61.0 in | Wt 158.6 lb

## 2013-12-23 DIAGNOSIS — R059 Cough, unspecified: Secondary | ICD-10-CM

## 2013-12-23 DIAGNOSIS — Z905 Acquired absence of kidney: Secondary | ICD-10-CM

## 2013-12-23 DIAGNOSIS — N950 Postmenopausal bleeding: Secondary | ICD-10-CM | POA: Insufficient documentation

## 2013-12-23 DIAGNOSIS — M1990 Unspecified osteoarthritis, unspecified site: Secondary | ICD-10-CM | POA: Insufficient documentation

## 2013-12-23 DIAGNOSIS — M109 Gout, unspecified: Secondary | ICD-10-CM

## 2013-12-23 DIAGNOSIS — IMO0002 Reserved for concepts with insufficient information to code with codable children: Secondary | ICD-10-CM

## 2013-12-23 DIAGNOSIS — M1710 Unilateral primary osteoarthritis, unspecified knee: Secondary | ICD-10-CM

## 2013-12-23 DIAGNOSIS — N840 Polyp of corpus uteri: Secondary | ICD-10-CM

## 2013-12-23 DIAGNOSIS — N201 Calculus of ureter: Secondary | ICD-10-CM | POA: Insufficient documentation

## 2013-12-23 DIAGNOSIS — M810 Age-related osteoporosis without current pathological fracture: Secondary | ICD-10-CM

## 2013-12-23 DIAGNOSIS — N151 Renal and perinephric abscess: Secondary | ICD-10-CM | POA: Insufficient documentation

## 2013-12-23 DIAGNOSIS — M21069 Valgus deformity, not elsewhere classified, unspecified knee: Secondary | ICD-10-CM

## 2013-12-23 DIAGNOSIS — I10 Essential (primary) hypertension: Secondary | ICD-10-CM

## 2013-12-23 DIAGNOSIS — N390 Urinary tract infection, site not specified: Secondary | ICD-10-CM

## 2013-12-23 DIAGNOSIS — G459 Transient cerebral ischemic attack, unspecified: Secondary | ICD-10-CM | POA: Insufficient documentation

## 2013-12-23 DIAGNOSIS — R05 Cough: Secondary | ICD-10-CM

## 2013-12-23 DIAGNOSIS — M1712 Unilateral primary osteoarthritis, left knee: Secondary | ICD-10-CM

## 2013-12-23 DIAGNOSIS — E785 Hyperlipidemia, unspecified: Secondary | ICD-10-CM | POA: Insufficient documentation

## 2013-12-23 LAB — POCT URINALYSIS DIPSTICK
Bilirubin, UA: NEGATIVE
Blood, UA: NEGATIVE
Glucose, UA: NEGATIVE
Ketones, UA: NEGATIVE
Leukocytes, UA: NEGATIVE
Nitrite, UA: NEGATIVE
Protein, UA: NEGATIVE
Spec Grav, UA: 1.01
Urobilinogen, UA: NEGATIVE
pH, UA: 6

## 2013-12-23 LAB — POCT UA - MICROSCOPIC ONLY
Casts, Ur, LPF, POC: NEGATIVE
Crystals, Ur, HPF, POC: NEGATIVE
Mucus, UA: NEGATIVE
RBC, urine, microscopic: NEGATIVE
Yeast, UA: NEGATIVE

## 2013-12-23 MED ORDER — HYDROCODONE-HOMATROPINE 5-1.5 MG/5ML PO SYRP: 5.0000 mL | ORAL_SOLUTION | Freq: Every day | ORAL | Status: DC

## 2013-12-23 NOTE — Progress Notes (Signed)
Patient ID: Paige Ferguson, female   DOB: 10-Aug-1921, 80 y.o.   MRN: [id number] SUBJECTIVE: CC: Chief Complaint  Patient presents with  . Follow-up    4 month follow up chronic problems doing well . would like refill on hycodan    HPI: Patient is here for follow up of hyperlipidemia/HTN: denies Headache;denies Chest Pain;denies weakness;denies Shortness of Breath and orthopnea;denies Visual changes;denies palpitations;denies cough;denies pedal edema;denies symptoms of TIA or stroke;deniesClaudication symptoms. admits to Compliance with medications; denies Problems with medications. BPs 138 usually. Gets a high reading here.  Had an episode when she couldn't speak and it resolved by the time EMS came to the house in 10 minutes. The EMT checked her out and she was fine.    Past Medical History  Diagnosis Date  . Recurrent UTI   . Ureteral calculi   . Hypertension   . Cholelithiases   . Postmenopausal bleeding   . Endometrial polyp 7/98    2 degree polyps   . Left knee pain   . Gout   . URI (upper respiratory infection)   . Genu valgum   . Hyperlipidemia   . Renal abscess, right   . Chronic kidney disease   . Osteoarthritis   . Arthritis of knee    Past Surgical History  Procedure Laterality Date  . Right nephrelithotomy  1982  . Incisional hernia repair  1983  . Right heminephrectomy for abscess  2/98  . Hysteroscopy , fractional d&c  7/98/  . Right cataract surgery  7/99    Dr. Dolores Lory   . Left cataract surgery  10/99   History   Social History  . Marital Status: Widowed    Spouse Name: N/A    Number of Children: N/A  . Years of Education: N/A   Occupational History  . Not on file.   Social History Main Topics  . Smoking status: Former Smoker    Types: Cigarettes    Quit date: 11/20/1944  . Smokeless tobacco: Not on file  . Alcohol Use: No  . Drug Use: No  . Sexual Activity: Not on file   Other Topics Concern  . Not on file   Social History Narrative    WIDOWED   3 CHILDREN 1 DECEASED   Family History  Problem Relation Age of Onset  . Stroke Maternal Grandmother   . Coronary artery disease Paternal Grandfather    Current Outpatient Prescriptions on File Prior to Visit  Medication Sig Dispense Refill  . allopurinol (ZYLOPRIM) 100 MG tablet Take 1 tablet (100 mg total) by mouth daily.  30 tablet  6  . amLODipine (NORVASC) 5 MG tablet Take 1 tablet (5 mg total) by mouth daily.  30 tablet  6  . atenolol (TENORMIN) 50 MG tablet TAKE  (1)  TABLET TWICE A DAY.  60 tablet  6  . benazepril (LOTENSIN) 40 MG tablet Take 1 tablet (40 mg total) by mouth daily.  30 tablet  6  . EVISTA 60 MG tablet TAKE 1 TABLET DAILY  30 tablet  1  . fexofenadine (ALLEGRA) 30 MG tablet Take 30 mg by mouth daily. Over the counter      . furosemide (LASIX) 40 MG tablet Take 1 tablet (40 mg total) by mouth daily. Taken one and half tablet by muoth daily  45 tablet  6  . lovastatin (MEVACOR) 20 MG tablet Take 1 tablet (20 mg total) by mouth at bedtime.  30 tablet  6  . meloxicam (MOBIC)  15 MG tablet Take 1 tablet (15 mg total) by mouth daily.  30 tablet  6  . Misc. Devices (STEP N REST II WALKER) MISC by Does not apply route daily. PT HAS RX. FOR ROLLING WALKER WITH SEAT AND HANDBRAKES .        No current facility-administered medications on file prior to visit.   Allergies  Allergen Reactions  . Amoxicillin   . Nitrofurantoin Monohyd Macro   . Sulfa Antibiotics    Immunization History  Administered Date(s) Administered  . Influenza,inj,Quad PF,36+ Mos 09/15/2013  . Pneumococcal Polysaccharide-23 01/19/2007   Prior to Admission medications   Medication Sig Start Date End Date Taking? Authorizing Provider  allopurinol (ZYLOPRIM) 100 MG tablet Take 1 tablet (100 mg total) by mouth daily. 09/15/13  Yes Vernie Shanks, MD  amLODipine (NORVASC) 5 MG tablet Take 1 tablet (5 mg total) by mouth daily. 09/15/13  Yes Vernie Shanks, MD  atenolol (TENORMIN) 50 MG tablet  TAKE  (1)  TABLET TWICE A DAY. 09/15/13  Yes Vernie Shanks, MD  benazepril (LOTENSIN) 40 MG tablet Take 1 tablet (40 mg total) by mouth daily. 09/15/13  Yes Vernie Shanks, MD  EVISTA 60 MG tablet TAKE 1 TABLET DAILY 03/21/13  Yes Chipper Herb, MD  fexofenadine (ALLEGRA) 30 MG tablet Take 30 mg by mouth daily. Over the counter   Yes Historical Provider, MD  furosemide (LASIX) 40 MG tablet Take 1 tablet (40 mg total) by mouth daily. Taken one and half tablet by muoth daily 09/15/13  Yes Vernie Shanks, MD  HYDROcodone-homatropine [facility]) 5-1.5 MG/5ML syrup Take 5 mLs by mouth at bedtime. 09/15/13  Yes Vernie Shanks, MD  lovastatin (MEVACOR) 20 MG tablet Take 1 tablet (20 mg total) by mouth at bedtime. 09/15/13  Yes Vernie Shanks, MD  meloxicam (MOBIC) 15 MG tablet Take 1 tablet (15 mg total) by mouth daily. 09/15/13  Yes Vernie Shanks, MD  Misc. Devices (STEP N REST II WALKER) MISC by Does not apply route daily. PT HAS RX. FOR ROLLING WALKER WITH SEAT AND HANDBRAKES .    Yes Historical Provider, MD     ROS: As above in the HPI. All other systems are stable or negative.  OBJECTIVE: APPEARANCE:  Patient in no acute distress.The patient appeared well nourished and normally developed. Acyanotic. Waist: VITAL SIGNS:BP 184/90  Pulse 65  Temp(Src) 97.8 F (36.6 C) (Oral)  Ht 5' 1"  (1.549 m)  Wt 158 lb 9.6 oz (71.94 kg)  BMI 29.98 kg/m2  BP 168/84 Elderly WF  SKIN: warm and  Dry without overt rashes, tattoos and scars  HEAD and Neck: without JVD, Head and scalp: normal Eyes:No scleral icterus. Fundi normal, eye movements normal. Ears: Auricle normal, canal normal, Tympanic membranes normal, insufflation normal. Nose: normal Throat: normal Neck & thyroid: normal  CHEST & LUNGS: Chest wall: normal Lungs: Clear  CVS: Reveals the PMI to be normally located. Regular rhythm, First and Second Heart sounds are normal,  absence of murmurs, rubs or gallops. Peripheral  vasculature: Radial pulses: normal Dorsal pedis pulses: normal Posterior pulses: normal  ABDOMEN:  Appearance: normal Benign, no organomegaly, no masses, no Abdominal Aortic enlargement. No Guarding , no rebound. No Bruits. Bowel sounds: normal  RECTAL: N/A GU: N/A  EXTREMETIES: nonedematous.  MUSCULOSKELETAL:  Spine:kyphosis   NEUROLOGIC: oriented to time,place and person; nonfocal. Strength is normal Sensory is normal Reflexes are normal Cranial Nerves are normal. Frail elderly ambulates with a rolling  walker with a seat.   Results for orders placed in visit on 12/23/13  POCT UA - MICROSCOPIC ONLY      Result Value Range   WBC, Ur, HPF, POC 1-3     RBC, urine, microscopic negative     Bacteria, U Microscopic occ     Mucus, UA negative     Epithelial cells, urine per micros occ     Crystals, Ur, HPF, POC negative     Casts, Ur, LPF, POC negative     Yeast, UA negative    POCT URINALYSIS DIPSTICK      Result Value Range   Color, UA gold     Clarity, UA clear     Glucose, UA negative     Bilirubin, UA negative     Ketones, UA negative     Spec Grav, UA 1.010     Blood, UA negative     pH, UA 6.0     Protein, UA negative     Urobilinogen, UA negative     Nitrite, UA negative     Leukocytes, UA Negative      ASSESSMENT:  HTN (hypertension) - Plan: CMP14+EGFR  Osteoporosis, unspecified  Gout - Plan: Uric acid  Genu valgum (acquired)  Arthritis of knee, left  Single kidney  Endometrial polyp  Recurrent UTI - Plan: POCT UA - Microscopic Only, POCT urinalysis dipstick, Urine culture  Hyperlipidemia - Plan: Lipid panel  TIA (transient ischemic attack)  Cough - Plan: HYDROcodone-homatropine (HYCODAN) 5-1.5 MG/5ML syrup  PLAN: Observe for now. Suspect a aTIA but with her advanced age there isn't much to offer. BP may appear high but her true BP may be lower and any further lowering may trigger a TIA or worse yet a stroke.  No changes in  medications.   Orders Placed This Encounter  Procedures  . Urine culture  . CMP14+EGFR  . Lipid panel  . Uric acid  . POCT UA - Microscopic Only  . POCT urinalysis dipstick   Meds ordered this encounter  Medications  . HYDROcodone-homatropine (HYCODAN) 5-1.5 MG/5ML syrup    Sig: Take 5 mLs by mouth at bedtime.    Dispense:  30 mL    Refill:  0   Medications Discontinued During This Encounter  Medication Reason  . HYDROcodone-homatropine (HYCODAN) 5-1.5 MG/5ML syrup Reorder   Return in about 4 months (around 04/22/2014) for Recheck medical problems.  Shereda Graw P. Jacelyn Grip, M.D.

## 2013-12-24 LAB — CMP14+EGFR
ALT: 11 IU/L (ref 0–32)
AST: 29 IU/L (ref 0–40)
Albumin/Globulin Ratio: 1.8 (ref 1.1–2.5)
Albumin: 4.2 g/dL (ref 3.2–4.6)
Alkaline Phosphatase: 67 IU/L (ref 39–117)
BUN/Creatinine Ratio: 27 — ABNORMAL HIGH (ref 11–26)
BUN: 25 mg/dL (ref 10–36)
CO2: 25 mmol/L (ref 18–29)
Calcium: 10.2 mg/dL (ref 8.7–10.3)
Chloride: 104 mmol/L (ref 97–108)
Creatinine, Ser: 0.91 mg/dL (ref 0.57–1.00)
GFR calc Af Amer: 64 mL/min/{1.73_m2} (ref >59)
GFR calc non Af Amer: 56 mL/min/{1.73_m2} — ABNORMAL LOW (ref >59)
Globulin, Total: 2.3 g/dL (ref 1.5–4.5)
Glucose: 94 mg/dL (ref 65–99)
Potassium: 4.4 mmol/L (ref 3.5–5.2)
Sodium: 145 mmol/L — ABNORMAL HIGH (ref 134–144)
Total Bilirubin: 0.7 mg/dL (ref 0.0–1.2)
Total Protein: 6.5 g/dL (ref 6.0–8.5)

## 2013-12-24 LAB — LIPID PANEL
Chol/HDL Ratio: 2.7 ratio units (ref 0.0–4.4)
Cholesterol, Total: 129 mg/dL (ref 100–199)
HDL: 48 mg/dL (ref >39)
LDL Calculated: 69 mg/dL (ref 0–99)
Triglycerides: 61 mg/dL (ref 0–149)
VLDL Cholesterol Cal: 12 mg/dL (ref 5–40)

## 2013-12-24 LAB — URIC ACID: Uric Acid: 4.5 mg/dL (ref 2.5–7.1)

## 2013-12-28 LAB — URINE CULTURE

## 2013-12-28 NOTE — Progress Notes (Signed)
Quick Note:  Call Patient Labs that are abnormal: Urine culture was positive for enterococcus, but she is allergic to the antibiotics that would clear it up. If not having symptoms then it is best to drink fluids /cranberry juice daily to suprress the bacteria. If she is having symptoms we may need to treat with certain antibiotics with significant side effects or reculture the urine. She might benefit from taking a enteric coated baby aspirin to reduce the TIAs. See me in 6 weeks to recheck the urine and to see how she did with the baby aspirin. The rest are at goal.    ______

## 2014-02-10 ENCOUNTER — Other Ambulatory Visit: Payer: Medicare Other

## 2014-03-09 ENCOUNTER — Encounter: Payer: Self-pay | Admitting: Family Medicine

## 2014-03-09 ENCOUNTER — Ambulatory Visit (INDEPENDENT_AMBULATORY_CARE_PROVIDER_SITE_OTHER): Payer: Medicare Other | Admitting: Family Medicine

## 2014-03-09 VITALS — BP 175/76 | HR 63 | Temp 98.4°F | Ht 61.0 in | Wt 155.8 lb

## 2014-03-09 DIAGNOSIS — K649 Unspecified hemorrhoids: Secondary | ICD-10-CM | POA: Insufficient documentation

## 2014-03-09 DIAGNOSIS — K625 Hemorrhage of anus and rectum: Secondary | ICD-10-CM | POA: Insufficient documentation

## 2014-03-09 DIAGNOSIS — K602 Anal fissure, unspecified: Secondary | ICD-10-CM | POA: Insufficient documentation

## 2014-03-09 LAB — POCT CBC
Granulocyte percent: 45.1 %G (ref 37–80)
HCT, POC: 41.7 % (ref 37.7–47.9)
Hemoglobin: 13.2 g/dL (ref 12.2–16.2)
Lymph, poc: 4 — AB (ref 0.6–3.4)
MCH, POC: 30.7 pg (ref 27–31.2)
MCHC: 31.7 g/dL — AB (ref 31.8–35.4)
MCV: 96.9 fL (ref 80–97)
MPV: 7 fL (ref 0–99.8)
POC Granulocyte: 3.6 (ref 2–6.9)
POC LYMPH PERCENT: 50.3 %L — AB (ref 10–50)
Platelet Count, POC: 251 10*3/uL (ref 142–424)
RBC: 4.3 M/uL (ref 4.04–5.48)
RDW, POC: 14 %
WBC: 8 10*3/uL (ref 4.6–10.2)

## 2014-03-09 NOTE — Progress Notes (Signed)
Patient ID: Paige Ferguson, female   DOB: Jun 15, 1921, 80 y.o.   MRN: [id number] SUBJECTIVE: CC: Chief Complaint  Patient presents with  . Acute Visit    blood in stools since yest . hx hemmmohhoids straining with bowels     HPI: After each BM has had BRBPR. More than usual. Not running out. Just a bit of red blood. Uses herbalax. No abdominal pain. It is painless. Feels like she is going to "break wind". And feels like she is needing to poop. No night sweats. Past Medical History  Diagnosis Date  . Recurrent UTI   . Ureteral calculi   . Hypertension   . Cholelithiases   . Postmenopausal bleeding   . Endometrial polyp 7/98    2 degree polyps   . Left knee pain   . Gout   . URI (upper respiratory infection)   . Genu valgum   . Hyperlipidemia   . Renal abscess, right   . Chronic kidney disease   . Osteoarthritis   . Arthritis of knee    Past Surgical History  Procedure Laterality Date  . Right nephrelithotomy  1982  . Incisional hernia repair  1983  . Right heminephrectomy for abscess  2/98  . Hysteroscopy , fractional d&c  7/98/  . Right cataract surgery  7/99    Dr. Dolores Lory   . Left cataract surgery  10/99   History   Social History  . Marital Status: Widowed    Spouse Name: N/A    Number of Children: N/A  . Years of Education: N/A   Occupational History  . Not on file.   Social History Main Topics  . Smoking status: Former Smoker    Types: Cigarettes    Quit date: 11/20/1944  . Smokeless tobacco: Not on file  . Alcohol Use: No  . Drug Use: No  . Sexual Activity: Not on file   Other Topics Concern  . Not on file   Social History Narrative   WIDOWED   3 CHILDREN 1 DECEASED   Family History  Problem Relation Age of Onset  . Stroke Maternal Grandmother   . Coronary artery disease Paternal Grandfather    Current Outpatient Prescriptions on File Prior to Visit  Medication Sig Dispense Refill  . allopurinol (ZYLOPRIM) 100 MG tablet Take 1 tablet (100 mg  total) by mouth daily.  30 tablet  6  . amLODipine (NORVASC) 5 MG tablet Take 1 tablet (5 mg total) by mouth daily.  30 tablet  6  . atenolol (TENORMIN) 50 MG tablet TAKE  (1)  TABLET TWICE A DAY.  60 tablet  6  . benazepril (LOTENSIN) 40 MG tablet Take 1 tablet (40 mg total) by mouth daily.  30 tablet  6  . EVISTA 60 MG tablet TAKE 1 TABLET DAILY  30 tablet  1  . fexofenadine (ALLEGRA) 30 MG tablet Take 30 mg by mouth daily. Over the counter      . furosemide (LASIX) 40 MG tablet Take 1 tablet (40 mg total) by mouth daily. Taken one and half tablet by muoth daily  45 tablet  6  . HYDROcodone-homatropine (HYCODAN) 5-1.5 MG/5ML syrup Take 5 mLs by mouth at bedtime.  30 mL  0  . lovastatin (MEVACOR) 20 MG tablet Take 1 tablet (20 mg total) by mouth at bedtime.  30 tablet  6  . meloxicam (MOBIC) 15 MG tablet Take 1 tablet (15 mg total) by mouth daily.  30 tablet  6  .  Misc. Devices (STEP N REST II WALKER) MISC by Does not apply route daily. PT HAS RX. FOR ROLLING WALKER WITH SEAT AND HANDBRAKES .        No current facility-administered medications on file prior to visit.   Allergies  Allergen Reactions  . Amoxicillin   . Nitrofurantoin Monohyd Macro   . Sulfa Antibiotics    Immunization History  Administered Date(s) Administered  . Influenza,inj,Quad PF,36+ Mos 09/15/2013  . Pneumococcal Polysaccharide-23 01/19/2007   Prior to Admission medications   Medication Sig Start Date End Date Taking? Authorizing Provider  allopurinol (ZYLOPRIM) 100 MG tablet Take 1 tablet (100 mg total) by mouth daily. 09/15/13  Yes Vernie Shanks, MD  amLODipine (NORVASC) 5 MG tablet Take 1 tablet (5 mg total) by mouth daily. 09/15/13  Yes Vernie Shanks, MD  atenolol (TENORMIN) 50 MG tablet TAKE  (1)  TABLET TWICE A DAY. 09/15/13  Yes Vernie Shanks, MD  benazepril (LOTENSIN) 40 MG tablet Take 1 tablet (40 mg total) by mouth daily. 09/15/13  Yes Vernie Shanks, MD  EVISTA 60 MG tablet TAKE 1 TABLET DAILY 03/21/13   Yes Chipper Herb, MD  fexofenadine (ALLEGRA) 30 MG tablet Take 30 mg by mouth daily. Over the counter   Yes Historical Provider, MD  furosemide (LASIX) 40 MG tablet Take 1 tablet (40 mg total) by mouth daily. Taken one and half tablet by muoth daily 09/15/13  Yes Vernie Shanks, MD  HYDROcodone-homatropine [facility]) 5-1.5 MG/5ML syrup Take 5 mLs by mouth at bedtime. 12/23/13  Yes Vernie Shanks, MD  lovastatin (MEVACOR) 20 MG tablet Take 1 tablet (20 mg total) by mouth at bedtime. 09/15/13  Yes Vernie Shanks, MD  meloxicam (MOBIC) 15 MG tablet Take 1 tablet (15 mg total) by mouth daily. 09/15/13  Yes Vernie Shanks, MD  Misc. Devices (STEP N REST II WALKER) MISC by Does not apply route daily. PT HAS RX. FOR ROLLING WALKER WITH SEAT AND HANDBRAKES .    Yes Historical Provider, MD     ROS: As above in the HPI. All other systems are stable or negative.  OBJECTIVE: APPEARANCE:  Patient in no acute distress.The patient appeared well nourished and normally developed. Acyanotic. Waist: VITAL SIGNS:BP 175/76  Pulse 63  Temp(Src) 98.4 F (36.9 C) (Oral)  Ht 5\' 1"  (1.549 m)  Wt 155 lb 12.8 oz (70.67 kg)  BMI 29.45 kg/m2 Elderly WF kyphosis  SKIN: warm and  Dry without overt rashes, tattoos and scars  HEAD and Neck: without JVD, Head and scalp: normal Eyes:No scleral icterus. Fundi normal, eye movements normal. Ears: Auricle normal, canal normal, Tympanic membranes normal, insufflation normal. Nose: normal Throat: normal Neck & thyroid: normal  CHEST & LUNGS: Chest wall: normal Lungs: Clear  CVS: Reveals the PMI to be normally located. Regular rhythm, First and Second Heart sounds are normal,  absence of murmurs, rubs or gallops. Peripheral vasculature: Radial pulses: normal Dorsal pedis pulses: normal Posterior pulses: normal  ABDOMEN:  Appearance: normal Benign, no organomegaly, no masses, no Abdominal Aortic enlargement. No Guarding , no rebound. No Bruits. Bowel  sounds: normal  RECTAL: anoscopy. There is a linear fissure over a hemorrhoid at the 5 o'clock position.no rectal masses on DRE. Heme positive brown stool. GU: N/A  EXTREMETIES: nonedematous.  MUSCULOSKELETAL:  Spine: kyphosis   NEUROLOGIC: oriented to time,place and person; nonfocal. Cranial Nerves are normal. She is frail and unsteady and uses a walker very well.  ASSESSMENT:  Rectal bleeding - Plan: POCT CBC, POCT CBC  Hemorrhoid  Fissure in ano  PLAN:  Orders Placed This Encounter  Procedures  . POCT CBC  . POCT CBC    Standing Status: Future     Number of Occurrences:      Standing Expiration Date: 04/08/2014   Results for orders placed in visit on 03/09/14  POCT CBC      Result Value Ref Range   WBC 8.0  4.6 - 10.2 K/uL   Lymph, poc 4.0 (*) 0.6 - 3.4   POC LYMPH PERCENT 50.3 (*) 10 - 50 %L   POC Granulocyte 3.6  2 - 6.9   Granulocyte percent 45.1  37 - 80 %G   RBC 4.3  4.04 - 5.48 M/uL   Hemoglobin 13.2  12.2 - 16.2 g/dL   HCT, POC 41.7  37.7 - 47.9 %   MCV 96.9  80 - 97 fL   MCH, POC 30.7  27 - 31.2 pg   MCHC 31.7 (*) 31.8 - 35.4 g/dL   RDW, POC 14.0     Platelet Count, POC 251.0  142 - 424 K/uL   MPV 7.0  0 - 99.8 fL   citrucel fiber laxative. Preparation - H for hemorrhoid.  Recheck blood count in 3 days.  To ED if uncontrolled rectal bleeding  Handout on hemorrhoids given.  Discussed with patient and her grandson. No orders of the defined types were placed in this encounter.   There are no discontinued medications. Return in about 4 weeks (around 04/06/2014) for recheck rectal bleeding from hemorrhoid.  Knute Mazzuca P. Jacelyn Grip, M.D.

## 2014-03-09 NOTE — Patient Instructions (Addendum)
citrucel fiber laxative. Preparation - H for hemorrhoid.  Recheck blood count in 3 days.  To ED if uncontrolled rectal bleeding   Hemorrhoids Hemorrhoids are swollen veins around the rectum or anus. There are two types of hemorrhoids:   Internal hemorrhoids. These occur in the veins just inside the rectum. They may poke through to the outside and become irritated and painful.  External hemorrhoids. These occur in the veins outside the anus and can be felt as a painful swelling or hard lump near the anus. CAUSES  Pregnancy.   Obesity.   Constipation or diarrhea.   Straining to have a bowel movement.   Sitting for long periods on the toilet.  Heavy lifting or other activity that caused you to strain.  Anal intercourse. SYMPTOMS   Pain.   Anal itching or irritation.   Rectal bleeding.   Fecal leakage.   Anal swelling.   One or more lumps around the anus.  DIAGNOSIS  Your caregiver may be able to diagnose hemorrhoids by visual examination. Other examinations or tests that may be performed include:   Examination of the rectal area with a gloved hand (digital rectal exam).   Examination of anal canal using a small tube (scope).   A blood test if you have lost a significant amount of blood.  A test to look inside the colon (sigmoidoscopy or colonoscopy). TREATMENT Most hemorrhoids can be treated at home. However, if symptoms do not seem to be getting better or if you have a lot of rectal bleeding, your caregiver may perform a procedure to help make the hemorrhoids get smaller or remove them completely. Possible treatments include:   Placing a rubber band at the base of the hemorrhoid to cut off the circulation (rubber band ligation).   Injecting a chemical to shrink the hemorrhoid (sclerotherapy).   Using a tool to burn the hemorrhoid (infrared light therapy).   Surgically removing the hemorrhoid (hemorrhoidectomy).   Stapling the hemorrhoid to  block blood flow to the tissue (hemorrhoid stapling).  HOME CARE INSTRUCTIONS   Eat foods with fiber, such as whole grains, beans, nuts, fruits, and vegetables. Ask your doctor about taking products with added fiber in them (fibersupplements).  Increase fluid intake. Drink enough water and fluids to keep your urine clear or pale yellow.   Exercise regularly.   Go to the bathroom when you have the urge to have a bowel movement. Do not wait.   Avoid straining to have bowel movements.   Keep the anal area dry and clean. Use wet toilet paper or moist towelettes after a bowel movement.   Medicated creams and suppositories may be used or applied as directed.   Only take over-the-counter or prescription medicines as directed by your caregiver.   Take warm sitz baths for 15 20 minutes, 3 4 times a day to ease pain and discomfort.   Place ice packs on the hemorrhoids if they are tender and swollen. Using ice packs between sitz baths may be helpful.   Put ice in a plastic bag.   Place a towel between your skin and the bag.   Leave the ice on for 15 20 minutes, 3 4 times a day.   Do not use a donut-shaped pillow or sit on the toilet for long periods. This increases blood pooling and pain.  SEEK MEDICAL CARE IF:  You have increasing pain and swelling that is not controlled by treatment or medicine.  You have uncontrolled bleeding.  You have difficulty  or you are unable to have a bowel movement.  You have pain or inflammation outside the area of the hemorrhoids. MAKE SURE YOU:  Understand these instructions.  Will watch your condition.  Will get help right away if you are not doing well or get worse. Document Released: 11/03/2000 Document Revised: 10/23/2012 Document Reviewed: 09/10/2012 [facility] Patient Information 2014 [place].

## 2014-03-12 ENCOUNTER — Other Ambulatory Visit (INDEPENDENT_AMBULATORY_CARE_PROVIDER_SITE_OTHER): Payer: Medicare Other

## 2014-03-12 DIAGNOSIS — K625 Hemorrhage of anus and rectum: Secondary | ICD-10-CM

## 2014-03-12 LAB — POCT CBC
Granulocyte percent: 46.5 %G (ref 37–80)
HCT, POC: 41.4 % (ref 37.7–47.9)
Hemoglobin: 13.1 g/dL (ref 12.2–16.2)
Lymph, poc: 3.8 — AB (ref 0.6–3.4)
MCH, POC: 30.6 pg (ref 27–31.2)
MCHC: 31.7 g/dL — AB (ref 31.8–35.4)
MCV: 96.7 fL (ref 80–97)
MPV: 7.6 fL (ref 0–99.8)
POC Granulocyte: 3.6 (ref 2–6.9)
POC LYMPH PERCENT: 48.8 %L (ref 10–50)
Platelet Count, POC: 277 10*3/uL (ref 142–424)
RBC: 4.3 M/uL (ref 4.04–5.48)
RDW, POC: 14.4 %
WBC: 7.7 10*3/uL (ref 4.6–10.2)

## 2014-03-12 NOTE — Progress Notes (Signed)
PT CAME IN FOR LABS ONLY 

## 2014-03-13 ENCOUNTER — Telehealth: Payer: Self-pay | Admitting: Family Medicine

## 2014-04-24 ENCOUNTER — Ambulatory Visit: Payer: Medicare Other | Admitting: General Practice

## 2014-04-28 ENCOUNTER — Ambulatory Visit (INDEPENDENT_AMBULATORY_CARE_PROVIDER_SITE_OTHER): Payer: Medicare Other | Admitting: Physician Assistant

## 2014-04-28 ENCOUNTER — Encounter: Payer: Self-pay | Admitting: Physician Assistant

## 2014-04-28 VITALS — BP 165/79 | HR 62 | Temp 98.2°F | Ht 61.0 in | Wt 158.8 lb

## 2014-04-28 DIAGNOSIS — Z905 Acquired absence of kidney: Secondary | ICD-10-CM

## 2014-04-28 DIAGNOSIS — I10 Essential (primary) hypertension: Secondary | ICD-10-CM

## 2014-04-28 DIAGNOSIS — M109 Gout, unspecified: Secondary | ICD-10-CM

## 2014-04-28 LAB — POCT URINALYSIS DIPSTICK
BILIRUBIN UA: NEGATIVE
Blood, UA: NEGATIVE
Glucose, UA: NEGATIVE
Ketones, UA: NEGATIVE
LEUKOCYTES UA: NEGATIVE
NITRITE UA: NEGATIVE
Protein, UA: NEGATIVE
Spec Grav, UA: 1.01
Urobilinogen, UA: NEGATIVE
pH, UA: 7

## 2014-04-28 LAB — POCT UA - MICROSCOPIC ONLY
Bacteria, U Microscopic: NEGATIVE
Casts, Ur, LPF, POC: NEGATIVE
Crystals, Ur, HPF, POC: NEGATIVE
Mucus, UA: NEGATIVE
RBC, urine, microscopic: NEGATIVE
WBC, Ur, HPF, POC: NEGATIVE
Yeast, UA: NEGATIVE

## 2014-04-28 NOTE — Progress Notes (Signed)
Subjective:     Patient ID: Paige Ferguson, female   DOB: Jun 22, 1921, 80 y.o.   MRN: [id number]  HPI Pt here for f/u of HTN,hyperlipid, and intermit chronic UTI She states she is doing very well She stays very active around the house Currently her grandson stays with her and helps around the house  Review of Systems  Constitutional: Negative for activity change, appetite change, fatigue and unexpected weight change.  HENT: Negative.   Respiratory: Negative.   Cardiovascular: Positive for leg swelling. Negative for palpitations.  Gastrointestinal: Negative.   Genitourinary: Negative.        Objective:   Physical Exam  Vitals reviewed. Constitutional: She is oriented to person, place, and time. She appears well-developed and well-nourished.  Neck: No JVD present. No thyromegaly present.  Cardiovascular: Normal rate, regular rhythm, normal heart sounds and intact distal pulses.   No murmur heard. Pulmonary/Chest: Effort normal and breath sounds normal. She has no wheezes. She exhibits no tenderness.  Abdominal: Soft. She exhibits no mass. There is no tenderness. There is no guarding.  Lymphadenopathy:    She has no cervical adenopathy.  Neurological: She is alert and oriented to person, place, and time. She has normal reflexes.  Skin: Skin is warm and dry.  Psychiatric: She has a normal mood and affect. Her behavior is normal. Judgment and thought content normal.  Pt has lower ext edema but is wearing her thigh high TED hose Full labs run today- see results    Assessment:     HTN, Hyperlipid Lower ext edema Chronic UTI    Plan:     Continue with her current activities BP sl elevated today but do not want to make any changes Will inform of lab results F/U in 4 months

## 2014-04-29 LAB — CMP14+EGFR
ALBUMIN: 4.3 g/dL (ref 3.2–4.6)
ALK PHOS: 71 IU/L (ref 39–117)
ALT: 13 IU/L (ref 0–32)
AST: 25 IU/L (ref 0–40)
Albumin/Globulin Ratio: 1.7 (ref 1.1–2.5)
BILIRUBIN TOTAL: 0.7 mg/dL (ref 0.0–1.2)
BUN / CREAT RATIO: 27 — AB (ref 11–26)
BUN: 31 mg/dL (ref 10–36)
CO2: 26 mmol/L (ref 18–29)
CREATININE: 1.16 mg/dL — AB (ref 0.57–1.00)
Calcium: 9.8 mg/dL (ref 8.7–10.3)
Chloride: 101 mmol/L (ref 97–108)
GFR calc non Af Amer: 41 mL/min/{1.73_m2} — ABNORMAL LOW (ref >59)
GFR, EST AFRICAN AMERICAN: 48 mL/min/{1.73_m2} — AB (ref >59)
Globulin, Total: 2.5 g/dL (ref 1.5–4.5)
Glucose: 103 mg/dL — ABNORMAL HIGH (ref 65–99)
Potassium: 4.2 mmol/L (ref 3.5–5.2)
Sodium: 143 mmol/L (ref 134–144)
Total Protein: 6.8 g/dL (ref 6.0–8.5)

## 2014-04-30 LAB — URINE CULTURE: ORGANISM ID, BACTERIA: NO GROWTH

## 2014-05-06 ENCOUNTER — Other Ambulatory Visit: Payer: Self-pay | Admitting: *Deleted

## 2014-05-06 DIAGNOSIS — I10 Essential (primary) hypertension: Secondary | ICD-10-CM

## 2014-05-06 DIAGNOSIS — M109 Gout, unspecified: Secondary | ICD-10-CM

## 2014-05-06 MED ORDER — ALLOPURINOL 100 MG PO TABS: 100.0000 mg | ORAL_TABLET | Freq: Every day | ORAL | Status: DC

## 2014-05-06 MED ORDER — AMLODIPINE BESYLATE 5 MG PO TABS: 5.0000 mg | ORAL_TABLET | Freq: Every day | ORAL | Status: DC

## 2014-05-06 NOTE — Addendum Note (Signed)
Addended by: Louann Sjogren A on: 05/06/2014 11:33 AM   Modules accepted: Orders

## 2014-05-25 ENCOUNTER — Other Ambulatory Visit: Payer: Self-pay

## 2014-05-25 MED ORDER — BENAZEPRIL HCL 40 MG PO TABS: 40.0000 mg | ORAL_TABLET | Freq: Every day | ORAL | Status: DC

## 2014-05-26 ENCOUNTER — Other Ambulatory Visit: Payer: Self-pay | Admitting: *Deleted

## 2014-05-26 MED ORDER — ATENOLOL 50 MG PO TABS: ORAL_TABLET | ORAL | Status: DC

## 2014-06-04 ENCOUNTER — Other Ambulatory Visit: Payer: Self-pay | Admitting: Family Medicine

## 2014-06-05 ENCOUNTER — Other Ambulatory Visit: Payer: Self-pay | Admitting: *Deleted

## 2014-06-05 DIAGNOSIS — M21069 Valgus deformity, not elsewhere classified, unspecified knee: Secondary | ICD-10-CM

## 2014-06-05 DIAGNOSIS — M1712 Unilateral primary osteoarthritis, left knee: Secondary | ICD-10-CM

## 2014-06-05 MED ORDER — MELOXICAM 15 MG PO TABS: 15.0000 mg | ORAL_TABLET | Freq: Every day | ORAL | Status: DC

## 2014-06-26 ENCOUNTER — Encounter: Payer: Self-pay | Admitting: Family Medicine

## 2014-06-26 ENCOUNTER — Ambulatory Visit (INDEPENDENT_AMBULATORY_CARE_PROVIDER_SITE_OTHER): Payer: Medicare Other | Admitting: Family Medicine

## 2014-06-26 VITALS — BP 161/76 | HR 66 | Temp 98.6°F | Ht 61.0 in | Wt 155.0 lb

## 2014-06-26 DIAGNOSIS — H60391 Other infective otitis externa, right ear: Secondary | ICD-10-CM

## 2014-06-26 DIAGNOSIS — H60399 Other infective otitis externa, unspecified ear: Secondary | ICD-10-CM

## 2014-06-26 MED ORDER — NEOMYCIN-POLYMYXIN-HC 3.5-10000-1 OT SOLN: 3.0000 [drp] | Freq: Four times a day (QID) | OTIC | Status: DC

## 2014-06-26 NOTE — Progress Notes (Signed)
   Subjective:    Patient ID: Paige Ferguson, female    DOB: 1921/07/14, 80 y.o.   MRN: [id number]  HPI C/o right ear pain   Review of Systems No chest pain, SOB, HA, dizziness, vision change, N/V, diarrhea, constipation, dysuria, urinary urgency or frequency, myalgias, arthralgias or rash.     Objective:   Physical Exam  Vital signs noted  Elderly female with walker in NAD  HEENT - Head atraumatic Normocephalic                Eyes - PERRLA, Conjuctiva - clear Sclera- Clear EOMI                Ears - right EAC with cerumen impaction and left eac clear and tm wnl                Nose - Nares patent                 Throat - oropharanx wnl Respiratory - Lungs CTA bilateral Cardiac - RRR S1 and S2 without murmur GI - Abdomen soft Nontender and bowel sounds active x4      Assessment & Plan:  Otitis, externa, infective, right - Plan: neomycin-polymyxin-hydrocortisone (CORTISPORIN) otic solution  Lysbeth Penner FNP

## 2014-07-02 ENCOUNTER — Telehealth: Payer: Self-pay | Admitting: Family Medicine

## 2014-07-02 ENCOUNTER — Encounter: Payer: Self-pay | Admitting: Family Medicine

## 2014-07-02 ENCOUNTER — Ambulatory Visit (INDEPENDENT_AMBULATORY_CARE_PROVIDER_SITE_OTHER): Payer: Medicare Other | Admitting: Family Medicine

## 2014-07-02 VITALS — BP 145/78 | HR 73 | Temp 97.5°F | Ht 61.0 in | Wt 153.0 lb

## 2014-07-02 DIAGNOSIS — IMO0002 Reserved for concepts with insufficient information to code with codable children: Secondary | ICD-10-CM

## 2014-07-02 DIAGNOSIS — R21 Rash and other nonspecific skin eruption: Secondary | ICD-10-CM

## 2014-07-02 DIAGNOSIS — L03114 Cellulitis of left upper limb: Secondary | ICD-10-CM

## 2014-07-02 LAB — POCT CBC
Granulocyte percent: 74 %G (ref 37–80)
HCT, POC: 36.4 % — AB (ref 37.7–47.9)
Hemoglobin: 12.1 g/dL — AB (ref 12.2–16.2)
Lymph, poc: 3 (ref 0.6–3.4)
MCH, POC: 31.6 pg — AB (ref 27–31.2)
MCHC: 33.3 g/dL (ref 31.8–35.4)
MCV: 94.9 fL (ref 80–97)
MPV: 6.8 fL (ref 0–99.8)
POC Granulocyte: 9 — AB (ref 2–6.9)
POC LYMPH PERCENT: 24.8 %L (ref 10–50)
Platelet Count, POC: 400 10*3/uL (ref 142–424)
RBC: 3.8 M/uL — AB (ref 4.04–5.48)
RDW, POC: 13.5 %
WBC: 12.1 10*3/uL — AB (ref 4.6–10.2)

## 2014-07-02 MED ORDER — LEVOFLOXACIN 500 MG PO TABS: 500.0000 mg | ORAL_TABLET | Freq: Every day | ORAL | Status: DC

## 2014-07-02 MED ORDER — CEFTRIAXONE SODIUM 1 G IJ SOLR
500.0000 mg | INTRAMUSCULAR | Status: AC
  Administered 2014-07-02: 500 mg via INTRAMUSCULAR

## 2014-07-02 NOTE — Telephone Encounter (Signed)
appt givne for 4:45 today with Rush [facility]mark

## 2014-07-02 NOTE — Progress Notes (Signed)
   Subjective:    Patient ID: Paige Ferguson, female    DOB: 05-06-1921, 80 y.o.   MRN: [id number]  HPI  C/o severe rash and discomfort left arm for 2 days.  She states it started out like fullness in her arm and now it is red hot and painful.  Review of Systems C/o left arm pain and rash   No chest pain, SOB, HA, dizziness, vision change, N/V, diarrhea, constipation, dysuria, urinary urgency or frequency, myalgias, arthralgias or rash.  Objective:   Physical Exam   Skin - Erythema and cellulitis left arm from elbow up to left shoulder.  Pustules left lateral elbow Results for orders placed in visit on 07/02/14  POCT CBC      Result Value Ref Range   WBC 12.1 (*) 4.6 - 10.2 K/uL   Lymph, poc 3.0  0.6 - 3.4   POC LYMPH PERCENT 24.8  10 - 50 %L   POC Granulocyte 9.0 (*) 2 - 6.9   Granulocyte percent 74.0  37 - 80 %G   RBC 3.8 (*) 4.04 - 5.48 M/uL   Hemoglobin 12.1 (*) 12.2 - 16.2 g/dL   HCT, POC 36.4 (*) 37.7 - 47.9 %   MCV 94.9  80 - 97 fL   MCH, POC 31.6 (*) 27 - 31.2 pg   MCHC 33.3  31.8 - 35.4 g/dL   RDW, POC 13.5     Platelet Count, POC 400.0  142 - 424 K/uL   MPV 6.8  0 - 99.8 fL      Assessment & Plan:  Rash and nonspecific skin eruption - Plan: POCT CBC, cefTRIAXone (ROCEPHIN) injection 500 mg, levofloxacin (LEVAQUIN) 500 MG tablet  Cellulitis of left upper extremity - Plan: cefTRIAXone (ROCEPHIN) injection 500 mg, levofloxacin (LEVAQUIN) 500 MG tablet  Lysbeth Penner FNP

## 2014-07-03 NOTE — Telephone Encounter (Signed)
Appointment put in

## 2014-07-03 NOTE — Telephone Encounter (Signed)
Appointment made

## 2014-07-06 ENCOUNTER — Ambulatory Visit (INDEPENDENT_AMBULATORY_CARE_PROVIDER_SITE_OTHER): Payer: Medicare Other | Admitting: Family

## 2014-07-06 ENCOUNTER — Encounter: Payer: Self-pay | Admitting: Family

## 2014-07-06 VITALS — BP 144/66 | HR 69 | Temp 98.1°F | Wt 156.0 lb

## 2014-07-06 DIAGNOSIS — IMO0002 Reserved for concepts with insufficient information to code with codable children: Secondary | ICD-10-CM

## 2014-07-06 DIAGNOSIS — L03114 Cellulitis of left upper limb: Secondary | ICD-10-CM

## 2014-07-06 NOTE — Progress Notes (Signed)
   Subjective:    Patient ID: Paige Ferguson, female    DOB: [birth year], 80 y.o.   MRN: [id number]  HPI Pt presents to the office for follow-up on cellulitis of left upper arm on 07/02/14. Pt was given rocephin injection and started on levofloxacin 500mg  daily for 10 days. Pt reports the swelling and redness is much improved. Pt states the redness started to elbow to shoulder, but now is only around her left elbow.   Review of Systems  Constitutional: Negative.   HENT: Negative.   Eyes: Negative.   Respiratory: Negative.  Negative for shortness of breath.   Cardiovascular: Negative.  Negative for palpitations.  Gastrointestinal: Negative.   Endocrine: Negative.   Genitourinary: Negative.   Musculoskeletal: Negative.   Skin: Positive for rash.  Neurological: Negative.  Negative for headaches.  Hematological: Negative.   Psychiatric/Behavioral: Negative.   All other systems reviewed and are negative.      Objective:   Physical Exam  Vitals reviewed. Constitutional: She is oriented to person, place, and time. She appears well-developed and well-nourished. No distress.  Cardiovascular: Normal rate, regular rhythm, normal heart sounds and intact distal pulses.   No murmur heard. Pulmonary/Chest: Effort normal and breath sounds normal. No respiratory distress. She has no wheezes.  Abdominal: Soft. Bowel sounds are normal. She exhibits no distension. There is no tenderness.  Musculoskeletal: Normal range of motion. She exhibits edema (trace amt of swelling above left elbow). She exhibits no tenderness.  Neurological: She is alert and oriented to person, place, and time. She has normal reflexes. No cranial nerve deficit.  Skin: Skin is warm and dry. There is erythema.  6cm X8cm erythemas circular with one small blister in the center  Psychiatric: She has a normal mood and affect. Her behavior is normal. Judgment and thought content normal.     BP 144/66  Pulse 69  Temp(Src) 98.1 F  (36.7 C) (Oral)  Wt 156 lb (70.761 kg)      Assessment & Plan:  1. Cellulitis of left upper extremity -Continue medication -S/S of infection discussed- Pt told to call if redness grows  -Elevation  Evelina Dun, FNP

## 2014-07-06 NOTE — Patient Instructions (Signed)

## 2014-07-28 ENCOUNTER — Other Ambulatory Visit: Payer: Self-pay | Admitting: Family Medicine

## 2014-07-29 NOTE — Telephone Encounter (Signed)
Last lipids 2/15.

## 2014-08-19 ENCOUNTER — Other Ambulatory Visit: Payer: Self-pay

## 2014-08-19 NOTE — Telephone Encounter (Signed)
Last seen 07/06/14  Alyse Low  This med not on EPIC list   If approved print and route to nurse

## 2014-08-21 MED ORDER — HYDROCODONE-HOMATROPINE 5-1.5 MG/5ML PO SYRP: ORAL_SOLUTION | ORAL | Status: DC

## 2014-08-21 NOTE — Telephone Encounter (Signed)
Patient aware.

## 2014-09-01 ENCOUNTER — Encounter: Payer: Self-pay | Admitting: Family

## 2014-09-01 ENCOUNTER — Ambulatory Visit (INDEPENDENT_AMBULATORY_CARE_PROVIDER_SITE_OTHER): Payer: Medicare Other | Admitting: Family

## 2014-09-01 VITALS — BP 173/74 | HR 62 | Temp 97.8°F | Ht 61.0 in | Wt 156.0 lb

## 2014-09-01 DIAGNOSIS — M109 Gout, unspecified: Secondary | ICD-10-CM

## 2014-09-01 DIAGNOSIS — Z23 Encounter for immunization: Secondary | ICD-10-CM

## 2014-09-01 DIAGNOSIS — I10 Essential (primary) hypertension: Secondary | ICD-10-CM

## 2014-09-01 DIAGNOSIS — E785 Hyperlipidemia, unspecified: Secondary | ICD-10-CM

## 2014-09-01 DIAGNOSIS — M1712 Unilateral primary osteoarthritis, left knee: Secondary | ICD-10-CM

## 2014-09-01 DIAGNOSIS — M810 Age-related osteoporosis without current pathological fracture: Secondary | ICD-10-CM

## 2014-09-01 DIAGNOSIS — Z1321 Encounter for screening for nutritional disorder: Secondary | ICD-10-CM

## 2014-09-01 DIAGNOSIS — J309 Allergic rhinitis, unspecified: Secondary | ICD-10-CM

## 2014-09-01 DIAGNOSIS — M170 Bilateral primary osteoarthritis of knee: Secondary | ICD-10-CM

## 2014-09-01 DIAGNOSIS — M129 Arthropathy, unspecified: Secondary | ICD-10-CM

## 2014-09-01 MED ORDER — ALLOPURINOL 100 MG PO TABS: 100.0000 mg | ORAL_TABLET | Freq: Every day | ORAL | Status: DC

## 2014-09-01 MED ORDER — FUROSEMIDE 40 MG PO TABS: 40.0000 mg | ORAL_TABLET | Freq: Every day | ORAL | Status: DC

## 2014-09-01 MED ORDER — AMLODIPINE BESYLATE 5 MG PO TABS: 5.0000 mg | ORAL_TABLET | Freq: Every day | ORAL | Status: DC

## 2014-09-01 MED ORDER — ATENOLOL 50 MG PO TABS: ORAL_TABLET | ORAL | Status: DC

## 2014-09-01 MED ORDER — BENAZEPRIL HCL 40 MG PO TABS: 40.0000 mg | ORAL_TABLET | Freq: Every day | ORAL | Status: DC

## 2014-09-01 MED ORDER — LOVASTATIN 20 MG PO TABS: ORAL_TABLET | ORAL | Status: DC

## 2014-09-01 MED ORDER — FEXOFENADINE HCL 30 MG PO TABS: 30.0000 mg | ORAL_TABLET | Freq: Every day | ORAL | Status: DC

## 2014-09-01 MED ORDER — RALOXIFENE HCL 60 MG PO TABS: ORAL_TABLET | ORAL | Status: DC

## 2014-09-01 NOTE — Patient Instructions (Signed)

## 2014-09-01 NOTE — Progress Notes (Signed)
Subjective:    Patient ID: Paige Ferguson, female    DOB: [birth year], 80 y.o.   MRN: [id number]  Hypertension This is a chronic problem. The current episode started more than 1 year ago. The problem has been waxing and waning since onset. The problem is uncontrolled. Pertinent negatives include no anxiety, blurred vision, headaches, palpitations, peripheral edema or shortness of breath. Risk factors for coronary artery disease include dyslipidemia and post-menopausal state. Past treatments include beta blockers, ACE inhibitors and diuretics. The current treatment provides mild improvement. Hypertensive end-organ damage includes kidney disease and CVA. There is no history of CAD/MI, heart failure or a thyroid problem. There is no history of sleep apnea.  Hyperlipidemia This is a chronic problem. The current episode started more than 1 year ago. The problem is controlled. Recent lipid tests were reviewed and are normal. She has no history of diabetes or hypothyroidism. Factors aggravating her hyperlipidemia include fatty foods. Pertinent negatives include no leg pain, myalgias or shortness of breath. Current antihyperlipidemic treatment includes statins. The current treatment provides moderate improvement of lipids. Risk factors for coronary artery disease include dyslipidemia, hypertension, post-menopausal and a sedentary lifestyle.  Arthritis Presents for follow-up visit. She complains of pain. She reports no joint swelling. The symptoms have been stable. Affected locations include the left knee, left shoulder, right MCP and left MCP. Pertinent negatives include no dry mouth, fever or rash. Her past medical history is significant for osteoarthritis. Past treatments include allopurinol, an opioid and NSAIDs. The treatment provided significant relief. Compliance with prior treatments has been good.  Gout Pt currently taking allopurinol. Pt states her last gout attack was "years ago".     Review of  Systems  Constitutional: Negative.  Negative for fever.  HENT: Negative.   Eyes: Negative.  Negative for blurred vision.  Respiratory: Negative.  Negative for shortness of breath.   Cardiovascular: Negative.  Negative for palpitations.  Gastrointestinal: Negative.   Endocrine: Negative.   Genitourinary: Negative.   Musculoskeletal: Positive for arthritis. Negative for joint swelling and myalgias.  Skin: Negative for rash.  Neurological: Negative.  Negative for headaches.  Hematological: Negative.   Psychiatric/Behavioral: Negative.   All other systems reviewed and are negative.      Objective:   Physical Exam  Vitals reviewed. Constitutional: She is oriented to person, place, and time. She appears well-developed and well-nourished. No distress.  HENT:  Head: Normocephalic and atraumatic.  Right Ear: External ear normal.  Left Ear: External ear normal.  Mouth/Throat: Oropharynx is clear and moist.  Eyes: Pupils are equal, round, and reactive to light.  Neck: Normal range of motion. Neck supple. No thyromegaly present.  Cardiovascular: Normal rate, regular rhythm, normal heart sounds and intact distal pulses.   No murmur heard. Pulmonary/Chest: Effort normal and breath sounds normal. No respiratory distress. She has no wheezes.  Abdominal: Soft. Bowel sounds are normal. She exhibits no distension. There is no tenderness.  Musculoskeletal: Normal range of motion. She exhibits edema (2+ in BLE). She exhibits no tenderness.  Neurological: She is alert and oriented to person, place, and time. She has normal reflexes. No cranial nerve deficit.  Skin: Skin is warm and dry.  Psychiatric: She has a normal mood and affect. Her behavior is normal. Judgment and thought content normal.      BP 173/74  Pulse 62  Temp(Src) 97.8 F (36.6 C) (Oral)  Ht _0  (1.549 m)  Wt 156 lb (70.761 kg)  BMI 29.49 kg/m2  Assessment & Plan:  1. Essential hypertension - CMP14+EGFR - amLODipine  (NORVASC) 5 MG tablet; Take 1 tablet (5 mg total) by mouth daily.  Dispense: 90 tablet; Refill: 4 - atenolol (TENORMIN) 50 MG tablet; TAKE  (1)  TABLET TWICE A DAY.  Dispense: 180 tablet; Refill: 4 - benazepril (LOTENSIN) 40 MG tablet; Take 1 tablet (40 mg total) by mouth daily.  Dispense: 90 tablet; Refill: 4 - furosemide (LASIX) 40 MG tablet; Take 1 tablet (40 mg total) by mouth daily. Taken one and half tablet by muoth daily  Dispense: 135 tablet; Refill: 4  2. Osteoporosis - DG Bone Density; Future - CMP14+EGFR - raloxifene (EVISTA) 60 MG tablet; TAKE 1 TABLET DAILY  Dispense: 90 tablet; Refill: 4  3. Primary osteoarthritis of both knees - CMP14+EGFR  4. Arthritis of knee, left - CMP14+EGFR  5. Gout, unspecified cause, unspecified chronicity, unspecified site - CMP14+EGFR - allopurinol (ZYLOPRIM) 100 MG tablet; Take 1 tablet (100 mg total) by mouth daily.  Dispense: 90 tablet; Refill: 4  6. Hyperlipidemia - CMP14+EGFR - Lipid panel - lovastatin (MEVACOR) 20 MG tablet; Take 1 tablet (20 mg total) by mouth at bedtime.  Dispense: 90 tablet; Refill: 4  7. Encounter for vitamin deficiency screening - Vit D  25 hydroxy (rtn osteoporosis monitoring)  8. Gout without tophus, unspecified cause, unspecified chronicity, unspecified site - allopurinol (ZYLOPRIM) 100 MG tablet; Take 1 tablet (100 mg total) by mouth daily.  Dispense: 90 tablet; Refill: 4  9. Allergic rhinitis, unspecified allergic rhinitis type - fexofenadine (ALLEGRA) 30 MG tablet; Take 1 tablet (30 mg total) by mouth daily. Over the counter  Dispense: 90 tablet; Refill: 3   Continue all meds Labs pending Health Maintenance reviewed-Flu shot given today Diet and exercise encouraged RTO 4 months  Evelina Dun, FNP

## 2014-09-02 ENCOUNTER — Other Ambulatory Visit: Payer: Self-pay | Admitting: Family

## 2014-09-02 LAB — CMP14+EGFR
ALT: 12 IU/L (ref 0–32)
AST: 24 IU/L (ref 0–40)
Albumin/Globulin Ratio: 1.6 (ref 1.1–2.5)
Albumin: 4.1 g/dL (ref 3.2–4.6)
Alkaline Phosphatase: 64 IU/L (ref 39–117)
BILIRUBIN TOTAL: 0.9 mg/dL (ref 0.0–1.2)
BUN/Creatinine Ratio: 21 (ref 11–26)
BUN: 20 mg/dL (ref 10–36)
CHLORIDE: 99 mmol/L (ref 97–108)
CO2: 27 mmol/L (ref 18–29)
Calcium: 9.8 mg/dL (ref 8.7–10.3)
Creatinine, Ser: 0.96 mg/dL (ref 0.57–1.00)
GFR calc Af Amer: 60 mL/min/{1.73_m2} (ref >59)
GFR, EST NON AFRICAN AMERICAN: 52 mL/min/{1.73_m2} — AB (ref >59)
Globulin, Total: 2.6 g/dL (ref 1.5–4.5)
Glucose: 101 mg/dL — ABNORMAL HIGH (ref 65–99)
POTASSIUM: 4 mmol/L (ref 3.5–5.2)
SODIUM: 142 mmol/L (ref 134–144)
Total Protein: 6.7 g/dL (ref 6.0–8.5)

## 2014-09-02 LAB — LIPID PANEL
CHOLESTEROL TOTAL: 132 mg/dL (ref 100–199)
Chol/HDL Ratio: 3.3 ratio units (ref 0.0–4.4)
HDL: 40 mg/dL (ref >39)
LDL Calculated: 73 mg/dL (ref 0–99)
Triglycerides: 95 mg/dL (ref 0–149)
VLDL Cholesterol Cal: 19 mg/dL (ref 5–40)

## 2014-09-02 LAB — VITAMIN D 25 HYDROXY (VIT D DEFICIENCY, FRACTURES): VIT D 25 HYDROXY: 29.2 ng/mL — AB (ref 30.0–100.0)

## 2014-09-02 MED ORDER — VITAMIN D (ERGOCALCIFEROL) 1.25 MG (50000 UNIT) PO CAPS: 50000.0000 [IU] | ORAL_CAPSULE | ORAL | Status: DC

## 2014-09-04 ENCOUNTER — Other Ambulatory Visit: Payer: Self-pay | Admitting: Family Medicine

## 2014-12-02 ENCOUNTER — Encounter: Payer: Self-pay | Admitting: Pharmacist

## 2014-12-02 ENCOUNTER — Ambulatory Visit (INDEPENDENT_AMBULATORY_CARE_PROVIDER_SITE_OTHER): Payer: Medicare Other | Admitting: Pharmacist

## 2014-12-02 ENCOUNTER — Ambulatory Visit (INDEPENDENT_AMBULATORY_CARE_PROVIDER_SITE_OTHER): Payer: Medicare Other

## 2014-12-02 VITALS — Ht 58.5 in | Wt 144.0 lb

## 2014-12-02 DIAGNOSIS — M810 Age-related osteoporosis without current pathological fracture: Secondary | ICD-10-CM

## 2014-12-02 LAB — HM DEXA SCAN

## 2014-12-02 NOTE — Progress Notes (Signed)
Patient ID: Paige Ferguson, female   DOB: Jan 26, 1921, 81 y.o.   MRN: [id number]  [facility] Current Height: Height: 4' 10.5" (148.6 cm)      Max Lifetime Height:  5\' 2"  Current Weight: Weight: 144 lb (65.318 kg)       Ethnicity:Caucasian    HPI: Does pt already have a diagnosis of:  Osteopenia?  Yes Osteoporosis?  No  Back Pain?  No       Kyphosis?  Yes Prior fracture?  No Med(s) for Osteoporosis/Osteopenia:  evista 60mg  daily Med(s) previously tried for Osteoporosis/Osteopenia:  none                                                             PMH: Age at menopause:  81 yo Hysterectomy?  No Oophorectomy?  No HRT? No Steroid Use?  No Thyroid med?  No History of cancer?  No History of digestive disorders (ie Crohn's)?  No Current or previous eating disorders?  No Last Vitamin D Result:  29.2 (09/01/2014) Last GFR Result:  52 (09/01/2014)   FH/SH: Family history of osteoporosis?  No Parent with history of hip fracture?  No Family history of breast cancer?  No Exercise?  No Smoking?  No Alcohol?  No    Calcium Assessment Calcium Intake  # of servings/day  Calcium mg  Milk (8 oz) 1  x  300  = 300mg   Yogurt (4 oz) 0 x  200 = 200mg   Cheese (1 oz) 1 x  200 = 200mg   Other Calcium sources   250mg   Ca supplement MVI - 400mg  daily = 400mg    Estimated calcium intake per day 1350mg     DEXA Results Date of Test T-Score for AP Spine L1-L4 T-Score for Total Left Hip T-Score for Total Right Hip  12/02/2014 -0.3 -2.1 -2.2  05/14/2006 -0.8 -1.6 -1.8  04/04/2004 -0.6 -1.5 --  10/20/1998 -1.1 -1.3 --   Lowest T-Score was -2.5 at neck of right hip (12/02/2014)  Assessment: Osteoporosis - BMD has decreased but not significant loss compared to last DEXA in 2007  Recommendations: 1.  Continue  reloxifine (EVISTA) 60mg  1 tablet daily 2.  continue calcium 1200mg  daily through supplementation or diet.  3.  recommend weight bearing exercise - 30 minutes at least 4 days per  week as able.  Also discussed chair exercise given patient's mobility limitations. 4.  Counseled and educated about fall risk and prevention.  Continue to use walker.  Recheck DEXA:  2 years  Time spent counseling patient:  30 minutes   Cherre Robins, PharmD, CPP

## 2014-12-02 NOTE — Patient Instructions (Signed)
Fall Prevention and Home Safety Falls cause injuries and can affect all age groups. It is possible to use preventive measures to significantly decrease the likelihood of falls. There are many simple measures which can make your home safer and prevent falls. OUTDOORS  Repair cracks and edges of walkways and driveways.  Remove high doorway thresholds.  Trim shrubbery on the main path into your home.  Have good outside lighting.  Clear walkways of tools, rocks, debris, and clutter.  Check that handrails are not broken and are securely fastened. Both sides of steps should have handrails.  Have leaves, snow, and ice cleared regularly.  Use sand or salt on walkways during winter months.  In the garage, clean up grease or oil spills. BATHROOM  Install night lights.  Install grab bars by the toilet and in the tub and shower.  Use non-skid mats or decals in the tub or shower.  Place a plastic non-slip stool in the shower to sit on, if needed.  Keep floors dry and clean up all water on the floor immediately.  Remove soap buildup in the tub or shower on a regular basis.  Secure bath mats with non-slip, double-sided rug tape.  Remove throw rugs and tripping hazards from the floors. BEDROOMS  Install night lights.  Make sure a bedside light is easy to reach.  Do not use oversized bedding.  Keep a telephone by your bedside.  Have a firm chair with side arms to use for getting dressed.  Remove throw rugs and tripping hazards from the floor. KITCHEN  Keep handles on pots and pans turned toward the center of the stove. Use back burners when possible.  Clean up spills quickly and allow time for drying.  Avoid walking on wet floors.  Avoid hot utensils and knives.  Position shelves so they are not too high or low.  Place commonly used objects within easy reach.  If necessary, use a sturdy step stool with a grab bar when reaching.  Keep electrical cables out of the  way.  Do not use floor polish or wax that makes floors slippery. If you must use wax, use non-skid floor wax.  Remove throw rugs and tripping hazards from the floor. STAIRWAYS  Never leave objects on stairs.  Place handrails on both sides of stairways and use them. Fix any loose handrails. Make sure handrails on both sides of the stairways are as long as the stairs.  Check carpeting to make sure it is firmly attached along stairs. Make repairs to worn or loose carpet promptly.  Avoid placing throw rugs at the top or bottom of stairways, or properly secure the rug with carpet tape to prevent slippage. Get rid of throw rugs, if possible.  Have an electrician put in a light switch at the top and bottom of the stairs. OTHER FALL PREVENTION TIPS  Wear low-heel or rubber-soled shoes that are supportive and fit well. Wear closed toe shoes.  When using a stepladder, make sure it is fully opened and both spreaders are firmly locked. Do not climb a closed stepladder.  Add color or contrast paint or tape to grab bars and handrails in your home. Place contrasting color strips on first and last steps.  Learn and use mobility aids as needed. Install an electrical emergency response system.  Turn on lights to avoid dark areas. Replace light bulbs that burn out immediately. Get light switches that glow.  Arrange furniture to create clear pathways. Keep furniture in the same place.    Firmly attach carpet with non-skid or double-sided tape.  Eliminate uneven floor surfaces.  Select a carpet pattern that does not visually hide the edge of steps.  Be aware of all pets. OTHER HOME SAFETY TIPS  Set the water temperature for 120 F (48.8 C).  Keep emergency numbers on or near the telephone.  Keep smoke detectors on every level of the home and near sleeping areas. Document Released: 10/27/2002 Document Revised: 05/07/2012 Document Reviewed: 01/26/2012 [facility] Patient Information 2015  [place], [place]. This information is not intended to replace advice given to you by your health care provider. Make sure you discuss any questions you have with your health care provider.   Calcium & Vitamin D: The Facts  Why is calcium and vitamin D consumption important? Calcium: . Most Americans do not consume adequate amounts of calcium! Calcium is required for proper muscle function, nerve communication, bone support, and many other functions in the body.  . The body uses bones as a source of calcium. Bones 'remodel' themselves continuously - the body constantly breaks bone down to release calcium and rebuilds bones by replacing calcium in the bone later.  . As we get older, the rate of bone breakdown occurs faster than bone rebuilding which could lead to osteopenia, osteoporosis, and possible fractures.   Vitamin D: . People naturally make vitamin D in the body when sunlight hits the skin and triggers a process that leads to vitamin D production. This natural vitamin D production requires about 10-15 minutes of sun exposure on the hands, arms, and face at least 2-3 times per week. However, due to decreased sun exposure and the use of sunscreen, most people will need to get additional vitamin D from foods or supplements. Your doctor can measure your body's vitamin D level through a simple blood test to determine your daily vitamin D needs.  . Vitamin D is used to help the body absorb calcium, maintain bone health, help the immune system, and reduce inflammation. It also plays a role in muscle performance, balance and risk of falling.  . Vitamin D deficiency can lead to osteomalacia or softening of the bones, bone pain, and muscle weakness.   The recommended daily allowance of Calcium and Vitamin D varies for different age groups. Age group Calcium (mg) Vitamin D (IU)  Females and Males: Age 62-50 1000 mg 600 IU  Females: Age 72- 55 1200 mg 600 IU  Males: Age 60-70 1000 mg 600 IU  Females and  Males: Age 64+ 1200 mg 800 IU  Pregnant/lactating Females age 69-50 1000 mg 600 IU   How much Calcium do you get in your diet? Calcium Intake # of servings per day  Total calcium (mg)  Skim milk, 2% milk (1 cup) _________ x 300 mg   Yogurt (1 small container) _________ x 200 mg   Cheese (1oz) _________ x 200 mg   Cottage Cheese (1 cup)             ________ x 150 mg   Almond milk (1 cup) _________ x 450 mg   Fortified Orange Juice (1 cup) _________ x 300 mg   Broccoli or spinach ( 1 cup) _________ x 100 mg   Salmon (3 oz) _________ x 150 mg    Almonds (1/4 cup) _______ x 90 mg      How do we get Calcium and Vitamin D in our diet? Calcium: . Obtaining calcium from the diet is the most preferred way to reach the recommended daily  goal. If this goal is not reached through diet, calcium supplements are available.  . Calcium is found in many foods including: dairy products, dark leafy vegetables (like broccoli, kale, and spinach), fish, and fortified products like juices and cereals.  . The food label will have a %DV (percent daily value) listed showing the amount of calcium per serving. To determine the total mg per serving, simply replace the % with zero (0).  For example, Almond Breeze almond milk contains 45% DV of calcium or 482m per 1 cup.  . You can increase the amount of calcium in your diet by using more calcium products in your daily meals. Use yogurt and fruit to make smoothies or use yogurt to top baked potatoes or make whipped potatoes. Sprinkle low fat cheese onto salads or into egg white omelets. You can even add non-fat dry milk powder (3044mcalcium per 1/3 cup) to hot cereals, meat loaf, soups, or potatoes.  . Calcium supplements come in many forms including tablets, chewables, and gummies. Be sure to read the label to determine the correct number of tablets per serving and whether or not to take the supplement with food.  . Calcium carbonate products (Oscal, Caltrate, and  Viactiv) are generally better absorbed when taken with food while calcium citrate products like Citracal can be taken with or without food.  . The body can only absorb about 600 mg of calcium at one time. It is recommended to take calcium supplements in small amounts several times per day.  However, taking it all at once is better than not taking it at all. . Increasing your intake of calcium is essential for bone health, but may also lead to some side effects like constipation, increased gas, bloating or abdominal cramping. To help reduce these side effects, start with 1 tablet per day and slowly increase your intake of the supplement to the recommended doses. It is also recommended that you drink plenty of water each day. Vitamin D: . Very few foods naturally contain vitamin D. However, it is found in saltwater fish (like tuna, salmon and mackerel), beef liver, egg yolks, cheese and vitamin D fortified foods (like yogurt, cereals, orange juice and milk) . The amount of vitamin D in each food or product is listed as %DV on the product label. To determine the total amount of vitamin D per serving, drop the % sign and multiply the number by 4. For example, 1 cup of Almond Breeze almond milk contains 25% DV vitamin D or 100 IU per serving (25 x 4 =100). . Vitamin D is also found in multivitamins and supplements and may be listed as ergocalciferol (vitamin D2) or cholecalciferol (vitamin D3). Each of these forms of vitamin D are equivalent and the daily recommended intake will vary based on your age and the vitamin D levels in your body. Follow your doctor's recommendation for vitamin D intake.                    Exercise for Strong Bones  Exercise is important to build and maintain strong bones / bone density.  There are 2 types of exercises that are important to building and maintaining strong bones:  Weight- bearing and muscle-stregthening.  Weight-bearing Exercises  These exercises include  activities that make you move against gravity while staying upright. Weight-bearing exercises can be high-impact or low-impact.  High-impact weight-bearing exercises help build bones and keep them strong. If you have broken a bone due to osteoporosis or  are at risk of breaking a bone, you may need to avoid high-impact exercises. If you're not sure, you should check with your healthcare provider.  Examples of high-impact weight-bearing exercises are: Dancing  Doing high-impact aerobics  Hiking  Jogging/running  Jumping Rope  Stair climbing  Tennis  Low-impact weight-bearing exercises can also help keep bones strong and are a safe alternative if you cannot do high-impact exercises.   Examples of low-impact weight-bearing exercises are: Using elliptical training machines  Doing low-impact aerobics  Using stair-step machines  Fast walking on a treadmill or outside   Muscle-Strengthening Exercises These exercises include activities where you move your body, a weight or some other resistance against gravity. They are also known as resistance exercises and include: Lifting weights  Using elastic exercise bands  Using weight machines  Lifting your own body weight  Functional movements, such as standing and rising up on your toes  Yoga and Pilates can also improve strength, balance and flexibility. However, certain positions may not be safe for people with osteoporosis or those at increased risk of broken bones. For example, exercises that have you bend forward may increase the chance of breaking a bone in the spine.   Non-Impact Exercises There are other types of exercises that can help prevent falls.  Non-impact exercises can help you to improve balance, posture and how well you move in everyday activities. Some of these exercises include: Balance exercises that strengthen your legs and test your balance, such as Tai Chi, can decrease your risk of falls.  Posture exercises that improve  your posture and reduce rounded or "sloping" shoulders can help you decrease the chance of breaking a bone, especially in the spine.  Functional exercises that improve how well you move can help you with everyday activities and decrease your chance of falling and breaking a bone. For example, if you have trouble getting up from a chair or climbing stairs, you should do these activities as exercises.   **A physical therapist can teach you balance, posture and functional exercises. He/she can also help you learn which exercises are safe and appropriate for you.  [facility] has a physical therapy office in [place] in front of our office and referrals can be made for assessments and treatment as needed and strength and balance training.  If you would like to have an assessment with [facility] and our physical therapy team please let a nurse or provider know.

## 2014-12-03 ENCOUNTER — Other Ambulatory Visit: Payer: Self-pay | Admitting: Family

## 2015-01-04 ENCOUNTER — Ambulatory Visit: Payer: Medicare Other | Admitting: Family

## 2015-01-05 ENCOUNTER — Ambulatory Visit: Payer: Medicare Other | Admitting: Family

## 2015-01-07 ENCOUNTER — Other Ambulatory Visit: Payer: Self-pay | Admitting: Nurse Practitioner

## 2015-01-12 ENCOUNTER — Ambulatory Visit (INDEPENDENT_AMBULATORY_CARE_PROVIDER_SITE_OTHER): Payer: Medicare Other | Admitting: Family

## 2015-01-12 ENCOUNTER — Encounter: Payer: Self-pay | Admitting: Family

## 2015-01-12 VITALS — BP 151/73 | HR 64 | Temp 97.7°F | Resp 16

## 2015-01-12 DIAGNOSIS — J019 Acute sinusitis, unspecified: Secondary | ICD-10-CM

## 2015-01-12 DIAGNOSIS — M109 Gout, unspecified: Secondary | ICD-10-CM | POA: Diagnosis not present

## 2015-01-12 DIAGNOSIS — I10 Essential (primary) hypertension: Secondary | ICD-10-CM | POA: Diagnosis not present

## 2015-01-12 DIAGNOSIS — M810 Age-related osteoporosis without current pathological fracture: Secondary | ICD-10-CM

## 2015-01-12 DIAGNOSIS — E785 Hyperlipidemia, unspecified: Secondary | ICD-10-CM

## 2015-01-12 DIAGNOSIS — M170 Bilateral primary osteoarthritis of knee: Secondary | ICD-10-CM

## 2015-01-12 DIAGNOSIS — Z905 Acquired absence of kidney: Secondary | ICD-10-CM | POA: Diagnosis not present

## 2015-01-12 MED ORDER — HYDROCODONE-HOMATROPINE 5-1.5 MG/5ML PO SYRP: 5.0000 mL | ORAL_SOLUTION | Freq: Three times a day (TID) | ORAL | Status: DC | PRN

## 2015-01-12 MED ORDER — MELOXICAM 15 MG PO TABS: 15.0000 mg | ORAL_TABLET | Freq: Every day | ORAL | Status: DC

## 2015-01-12 MED ORDER — AZITHROMYCIN 250 MG PO TABS: ORAL_TABLET | ORAL | Status: DC

## 2015-01-12 NOTE — Progress Notes (Signed)
Subjective:    Patient ID: Paige Ferguson, female    DOB: May 29, 1921, 81 y.o.   MRN: [id number]  Hypertension This is a chronic problem. The current episode started more than 1 year ago. The problem has been waxing and waning since onset. The problem is uncontrolled. Pertinent negatives include no anxiety, blurred vision, headaches, palpitations, peripheral edema or shortness of breath. Risk factors for coronary artery disease include dyslipidemia and post-menopausal state. Past treatments include beta blockers, ACE inhibitors and diuretics. The current treatment provides mild improvement. Hypertensive end-organ damage includes kidney disease and CVA. There is no history of CAD/MI, heart failure or a thyroid problem. There is no history of sleep apnea.  Hyperlipidemia This is a chronic problem. The current episode started more than 1 year ago. The problem is controlled. Recent lipid tests were reviewed and are normal. She has no history of diabetes or hypothyroidism. Factors aggravating her hyperlipidemia include fatty foods. Pertinent negatives include no leg pain, myalgias or shortness of breath. Current antihyperlipidemic treatment includes statins. The current treatment provides moderate improvement of lipids. Risk factors for coronary artery disease include dyslipidemia, hypertension, post-menopausal and a sedentary lifestyle.  Arthritis Presents for follow-up visit. The disease course has been fluctuating. She reports no pain or joint swelling. Affected locations include the right MCP, left MCP and left knee. Her pain is at a severity of 6/10. Pertinent negatives include no dry eyes, fever or rash. Her past medical history is significant for osteoarthritis. Her pertinent risk factors include overuse. Past treatments include NSAIDs and rest. The treatment provided moderate relief. Factors aggravating her arthritis include activity.  Sinus Problem This is a new problem. The current episode started 1  to 4 weeks ago. The problem is unchanged. There has been no fever. Associated symptoms include congestion, coughing, sinus pressure and sneezing. Pertinent negatives include no headaches, shortness of breath or sore throat. Past treatments include oral decongestants, saline sprays, sitting up and spray decongestants. The treatment provided mild relief.  Gout Pt currently taking allopurinol for Gout. Pt states she is doing well. Pt states she can't remember her "last attack".     Review of Systems  Constitutional: Negative.  Negative for fever.  HENT: Positive for congestion, sinus pressure and sneezing. Negative for sore throat.   Eyes: Negative.  Negative for blurred vision.  Respiratory: Positive for cough. Negative for shortness of breath.   Cardiovascular: Negative.  Negative for palpitations.  Gastrointestinal: Negative.   Endocrine: Negative.   Genitourinary: Negative.   Musculoskeletal: Positive for arthritis. Negative for myalgias and joint swelling.  Skin: Negative for rash.  Neurological: Negative.  Negative for headaches.  Hematological: Negative.   Psychiatric/Behavioral: Negative.   All other systems reviewed and are negative.      Objective:   Physical Exam  Constitutional: She is oriented to person, place, and time. She appears well-developed and well-nourished. No distress.  HENT:  Head: Normocephalic and atraumatic.  Right Ear: External ear normal.  Left Ear: External ear normal.  Mouth/Throat: Oropharynx is clear and moist.  Nasal passage erythemas with mild swelling    Eyes: Pupils are equal, round, and reactive to light.  Neck: Normal range of motion. Neck supple. No thyromegaly present.  Cardiovascular: Normal rate, regular rhythm, normal heart sounds and intact distal pulses.   No murmur heard. Pulmonary/Chest: Effort normal and breath sounds normal. No respiratory distress. She has no wheezes.  Abdominal: Soft. Bowel sounds are normal. She exhibits no  distension. There is no tenderness.  Musculoskeletal: Normal range of motion. She exhibits no edema or tenderness.  Neurological: She is alert and oriented to person, place, and time. She has normal reflexes. No cranial nerve deficit.  Skin: Skin is warm and dry.  Psychiatric: She has a normal mood and affect. Her behavior is normal. Judgment and thought content normal.  Vitals reviewed.    BP 151/73 mmHg  Pulse 64  Temp(Src) 97.7 F (36.5 C) (Oral)  Resp 16      Assessment & Plan:  1. Essential hypertension - CMP14+EGFR  2. Osteoporosis - CMP14+EGFR - Vit D  25 hydroxy (rtn osteoporosis monitoring)  3. Primary osteoarthritis of both knees - meloxicam (MOBIC) 15 MG tablet; Take 1 tablet (15 mg total) by mouth daily.  Dispense: 90 tablet; Refill: 1 - CMP14+EGFR  4. Hyperlipidemia - CMP14+EGFR  5. Gout, unspecified cause, unspecified chronicity, unspecified site - CMP14+EGFR  6. Single kidney - CMP14+EGFR  7. Acute sinusitis, recurrence not specified, unspecified location - HYDROcodone-homatropine (HYCODAN) 5-1.5 MG/5ML syrup; Take 5 mLs by mouth every 8 (eight) hours as needed for cough.  Dispense: 120 mL; Refill: 0 - azithromycin (ZITHROMAX) 250 MG tablet; Take 500 mg once, then 250 mg for four days  Dispense: 6 tablet; Refill: 0   Continue all meds Labs pending Health Maintenance reviewed Diet and exercise encouraged RTO 4  months  Evelina Dun, FNP

## 2015-01-12 NOTE — Patient Instructions (Signed)
Sinusitis Sinusitis is redness, soreness, and inflammation of the paranasal sinuses. Paranasal sinuses are air pockets within the bones of your face (beneath the eyes, the middle of the forehead, or above the eyes). In healthy paranasal sinuses, mucus is able to drain out, and air is able to circulate through them by way of your nose. However, when your paranasal sinuses are inflamed, mucus and air can become trapped. This can allow bacteria and other germs to grow and cause infection. Sinusitis can develop quickly and last only a short time (acute) or continue over a long period (chronic). Sinusitis that lasts for more than 12 weeks is considered chronic.  CAUSES  Causes of sinusitis include:  Allergies.  Structural abnormalities, such as displacement of the cartilage that separates your nostrils (deviated septum), which can decrease the air flow through your nose and sinuses and affect sinus drainage.  Functional abnormalities, such as when the small hairs (cilia) that line your sinuses and help remove mucus do not work properly or are not present. SIGNS AND SYMPTOMS  Symptoms of acute and chronic sinusitis are the same. The primary symptoms are pain and pressure around the affected sinuses. Other symptoms include:  Upper toothache.  Earache.  Headache.  Bad breath.  Decreased sense of smell and taste.  A cough, which worsens when you are lying flat.  Fatigue.  Fever.  Thick drainage from your nose, which often is green and may contain pus (purulent).  Swelling and warmth over the affected sinuses. DIAGNOSIS  Your health care provider will perform a physical exam. During the exam, your health care provider may:  Look in your nose for signs of abnormal growths in your nostrils (nasal polyps).  Tap over the affected sinus to check for signs of infection.  View the inside of your sinuses (endoscopy) using an imaging device that has a light attached (endoscope). If your health  care provider suspects that you have chronic sinusitis, one or more of the following tests may be recommended:  Allergy tests.  Nasal culture. A sample of mucus is taken from your nose, sent to a lab, and screened for bacteria.  Nasal cytology. A sample of mucus is taken from your nose and examined by your health care provider to determine if your sinusitis is related to an allergy. TREATMENT  Most cases of acute sinusitis are related to a viral infection and will resolve on their own within 10 days. Sometimes medicines are prescribed to help relieve symptoms (pain medicine, decongestants, nasal steroid sprays, or saline sprays).  However, for sinusitis related to a bacterial infection, your health care provider will prescribe antibiotic medicines. These are medicines that will help kill the bacteria causing the infection.  Rarely, sinusitis is caused by a fungal infection. In theses cases, your health care provider will prescribe antifungal medicine. For some cases of chronic sinusitis, surgery is needed. Generally, these are cases in which sinusitis recurs more than 3 times per year, despite other treatments. HOME CARE INSTRUCTIONS   Drink plenty of water. Water helps thin the mucus so your sinuses can drain more easily.  Use a humidifier.  Inhale steam 3 to 4 times a day (for example, sit in the bathroom with the shower running).  Apply a warm, moist washcloth to your face 3 to 4 times a day, or as directed by your health care provider.  Use saline nasal sprays to help moisten and clean your sinuses.  Take medicines only as directed by your health care provider.    If you were prescribed either an antibiotic or antifungal medicine, finish it all even if you start to feel better. SEEK IMMEDIATE MEDICAL CARE IF:  You have increasing pain or severe headaches.  You have nausea, vomiting, or drowsiness.  You have swelling around your face.  You have vision problems.  You have a stiff  neck.  You have difficulty breathing. MAKE SURE YOU:   Understand these instructions.  Will watch your condition.  Will get help right away if you are not doing well or get worse. Document Released: 11/06/2005 Document Revised: 03/23/2014 Document Reviewed: 11/21/2011 [facility] Patient Information 2015 [place], [place]. This information is not intended to replace advice given to you by your health care provider. Make sure you discuss any questions you have with your health care provider. Health Maintenance Adopting a healthy lifestyle and getting preventive care can go a long way to promote health and wellness. Talk with your health care provider about what schedule of regular examinations is right for you. This is a good chance for you to check in with your provider about disease prevention and staying healthy. In between checkups, there are plenty of things you can do on your own. Experts have done a lot of research about which lifestyle changes and preventive measures are most likely to keep you healthy. Ask your health care provider for more information. WEIGHT AND DIET  Eat a healthy diet  Be sure to include plenty of vegetables, fruits, low-fat dairy products, and lean protein.  Do not eat a lot of foods high in solid fats, added sugars, or salt.  Get regular exercise. This is one of the most important things you can do for your health.  Most adults should exercise for at least 150 minutes each week. The exercise should increase your heart rate and make you sweat (moderate-intensity exercise).  Most adults should also do strengthening exercises at least twice a week. This is in addition to the moderate-intensity exercise.  Maintain a healthy weight  Body mass index (BMI) is a measurement that can be used to identify possible weight problems. It estimates body fat based on height and weight. Your health care provider can help determine your BMI and help you achieve or maintain a  healthy weight.  For females 55 years of age and older:   A BMI below 18.5 is considered underweight.  A BMI of 18.5 to 24.9 is normal.  A BMI of 25 to 29.9 is considered overweight.  A BMI of 30 and above is considered obese.  Watch levels of cholesterol and blood lipids  You should start having your blood tested for lipids and cholesterol at 79 years of age, then have this test every 5 years.  You may need to have your cholesterol levels checked more often if:  Your lipid or cholesterol levels are high.  You are older than 79 years of age.  You are at high risk for heart disease.  CANCER SCREENING   Lung Cancer  Lung cancer screening is recommended for adults 64-62 years old who are at high risk for lung cancer because of a history of smoking.  A yearly low-dose CT scan of the lungs is recommended for people who:  Currently smoke.  Have quit within the past 15 years.  Have at least a 30-pack-year history of smoking. A pack year is smoking an average of one pack of cigarettes a day for 1 year.  Yearly screening should continue until it has been 15 years since  you quit.  Yearly screening should stop if you develop a health problem that would prevent you from having lung cancer treatment.  Breast Cancer  Practice breast self-awareness. This means understanding how your breasts normally appear and feel.  It also means doing regular breast self-exams. Let your health care provider know about any changes, no matter how small.  If you are in your 20s or 30s, you should have a clinical breast exam (CBE) by a health care provider every 1-3 years as part of a regular health exam.  If you are 42 or older, have a CBE every year. Also consider having a breast X-ray (mammogram) every year.  If you have a family history of breast cancer, talk to your health care provider about genetic screening.  If you are at high risk for breast cancer, talk to your health care provider  about having an MRI and a mammogram every year.  Breast cancer gene (BRCA) assessment is recommended for women who have family members with BRCA-related cancers. BRCA-related cancers include:  Breast.  Ovarian.  Tubal.  Peritoneal cancers.  Results of the assessment will determine the need for genetic counseling and BRCA1 and BRCA2 testing. Cervical Cancer Routine pelvic examinations to screen for cervical cancer are no longer recommended for nonpregnant women who are considered low risk for cancer of the pelvic organs (ovaries, uterus, and vagina) and who do not have symptoms. A pelvic examination may be necessary if you have symptoms including those associated with pelvic infections. Ask your health care provider if a screening pelvic exam is right for you.   The Pap test is the screening test for cervical cancer for women who are considered at risk.  If you had a hysterectomy for a problem that was not cancer or a condition that could lead to cancer, then you no longer need Pap tests.  If you are older than 65 years, and you have had normal Pap tests for the past 10 years, you no longer need to have Pap tests.  If you have had past treatment for cervical cancer or a condition that could lead to cancer, you need Pap tests and screening for cancer for at least 20 years after your treatment.  If you no longer get a Pap test, assess your risk factors if they change (such as having a new sexual partner). This can affect whether you should start being screened again.  Some women have medical problems that increase their chance of getting cervical cancer. If this is the case for you, your health care provider may recommend more frequent screening and Pap tests.  The human papillomavirus (HPV) test is another test that may be used for cervical cancer screening. The HPV test looks for the virus that can cause cell changes in the cervix. The cells collected during the Pap test can be tested for  HPV.  The HPV test can be used to screen women 33 years of age and older. Getting tested for HPV can extend the interval between normal Pap tests from three to five years.  An HPV test also should be used to screen women of any age who have unclear Pap test results.  After 79 years of age, women should have HPV testing as often as Pap tests.  Colorectal Cancer  This type of cancer can be detected and often prevented.  Routine colorectal cancer screening usually begins at 79 years of age and continues through 79 years of age.  Your health care provider may  recommend screening at an earlier age if you have risk factors for colon cancer.  Your health care provider may also recommend using home test kits to check for hidden blood in the stool.  A small camera at the end of a tube can be used to examine your colon directly (sigmoidoscopy or colonoscopy). This is done to check for the earliest forms of colorectal cancer.  Routine screening usually begins at age 67.  Direct examination of the colon should be repeated every 5-10 years through 79 years of age. However, you may need to be screened more often if early forms of precancerous polyps or small growths are found. Skin Cancer  Check your skin from head to toe regularly.  Tell your health care provider about any new moles or changes in moles, especially if there is a change in a mole's shape or color.  Also tell your health care provider if you have a mole that is larger than the size of a pencil eraser.  Always use sunscreen. Apply sunscreen liberally and repeatedly throughout the day.  Protect yourself by wearing long sleeves, pants, a wide-brimmed hat, and sunglasses whenever you are outside. HEART DISEASE, DIABETES, AND HIGH BLOOD PRESSURE   Have your blood pressure checked at least every 1-2 years. High blood pressure causes heart disease and increases the risk of stroke.  If you are between 74 years and 26 years old, ask  your health care provider if you should take aspirin to prevent strokes.  Have regular diabetes screenings. This involves taking a blood sample to check your fasting blood sugar level.  If you are at a normal weight and have a low risk for diabetes, have this test once every three years after 79 years of age.  If you are overweight and have a high risk for diabetes, consider being tested at a younger age or more often. PREVENTING INFECTION  Hepatitis B  If you have a higher risk for hepatitis B, you should be screened for this virus. You are considered at high risk for hepatitis B if:  You were born in a country where hepatitis B is common. Ask your health care provider which countries are considered high risk.  Your parents were born in a high-risk country, and you have not been immunized against hepatitis B (hepatitis B vaccine).  You have HIV or AIDS.  You use needles to inject street drugs.  You live with someone who has hepatitis B.  You have had sex with someone who has hepatitis B.  You get hemodialysis treatment.  You take certain medicines for conditions, including cancer, organ transplantation, and autoimmune conditions. Hepatitis C  Blood testing is recommended for:  Everyone born from [birth year] through 1963.  Anyone with known risk factors for hepatitis C. Sexually transmitted infections (STIs)  You should be screened for sexually transmitted infections (STIs) including gonorrhea and chlamydia if:  You are sexually active and are younger than 79 years of age.  You are older than 79 years of age and your health care provider tells you that you are at risk for this type of infection.  Your sexual activity has changed since you were last screened and you are at an increased risk for chlamydia or gonorrhea. Ask your health care provider if you are at risk.  If you do not have HIV, but are at risk, it may be recommended that you take a prescription medicine daily to  prevent HIV infection. This is called pre-exposure prophylaxis (PrEP). You  are considered at risk if:  You are sexually active and do not regularly use condoms or know the HIV status of your partner(s).  You take drugs by injection.  You are sexually active with a partner who has HIV. Talk with your health care provider about whether you are at high risk of being infected with HIV. If you choose to begin PrEP, you should first be tested for HIV. You should then be tested every 3 months for as long as you are taking PrEP.  PREGNANCY   If you are premenopausal and you may become pregnant, ask your health care provider about preconception counseling.  If you may become pregnant, take 400 to 800 micrograms (mcg) of folic acid every day.  If you want to prevent pregnancy, talk to your health care provider about birth control (contraception). OSTEOPOROSIS AND MENOPAUSE   Osteoporosis is a disease in which the bones lose minerals and strength with aging. This can result in serious bone fractures. Your risk for osteoporosis can be identified using a bone density scan.  If you are 89 years of age or older, or if you are at risk for osteoporosis and fractures, ask your health care provider if you should be screened.  Ask your health care provider whether you should take a calcium or vitamin D supplement to lower your risk for osteoporosis.  Menopause may have certain physical symptoms and risks.  Hormone replacement therapy may reduce some of these symptoms and risks. Talk to your health care provider about whether hormone replacement therapy is right for you.  HOME CARE INSTRUCTIONS   Schedule regular health, dental, and eye exams.  Stay current with your immunizations.   Do not use any tobacco products including cigarettes, chewing tobacco, or electronic cigarettes.  If you are pregnant, do not drink alcohol.  If you are breastfeeding, limit how much and how often you drink  alcohol.  Limit alcohol intake to no more than 1 drink per day for nonpregnant women. One drink equals 12 ounces of beer, 5 ounces of wine, or 1 ounces of hard liquor.  Do not use street drugs.  Do not share needles.  Ask your health care provider for help if you need support or information about quitting drugs.  Tell your health care provider if you often feel depressed.  Tell your health care provider if you have ever been abused or do not feel safe at home. Document Released: 05/22/2011 Document Revised: 03/23/2014 Document Reviewed: 10/08/2013 [facility] Patient Information 2015 [place], [place]. This information is not intended to replace advice given to you by your health care provider. Make sure you discuss any questions you have with your health care provider.

## 2015-01-13 LAB — CMP14+EGFR
ALBUMIN: 4 g/dL (ref 3.2–4.6)
ALT: 12 IU/L (ref 0–32)
AST: 27 IU/L (ref 0–40)
Albumin/Globulin Ratio: 1.8 (ref 1.1–2.5)
Alkaline Phosphatase: 57 IU/L (ref 39–117)
BUN / CREAT RATIO: 23 (ref 11–26)
BUN: 22 mg/dL (ref 10–36)
Bilirubin Total: 0.5 mg/dL (ref 0.0–1.2)
CALCIUM: 9.2 mg/dL (ref 8.7–10.3)
CHLORIDE: 100 mmol/L (ref 97–108)
CO2: 27 mmol/L (ref 18–29)
Creatinine, Ser: 0.95 mg/dL (ref 0.57–1.00)
GFR calc Af Amer: 61 mL/min/{1.73_m2} (ref >59)
GFR, EST NON AFRICAN AMERICAN: 53 mL/min/{1.73_m2} — AB (ref >59)
GLUCOSE: 115 mg/dL — AB (ref 65–99)
Globulin, Total: 2.2 g/dL (ref 1.5–4.5)
Potassium: 4.1 mmol/L (ref 3.5–5.2)
SODIUM: 143 mmol/L (ref 134–144)
Total Protein: 6.2 g/dL (ref 6.0–8.5)

## 2015-01-13 LAB — VITAMIN D 25 HYDROXY (VIT D DEFICIENCY, FRACTURES): Vit D, 25-Hydroxy: 57 ng/mL (ref 30.0–100.0)

## 2015-04-12 DIAGNOSIS — H52223 Regular astigmatism, bilateral: Secondary | ICD-10-CM | POA: Diagnosis not present

## 2015-04-12 DIAGNOSIS — H5203 Hypermetropia, bilateral: Secondary | ICD-10-CM | POA: Diagnosis not present

## 2015-04-12 DIAGNOSIS — H524 Presbyopia: Secondary | ICD-10-CM | POA: Diagnosis not present

## 2015-04-12 DIAGNOSIS — Z961 Presence of intraocular lens: Secondary | ICD-10-CM | POA: Diagnosis not present

## 2015-05-10 ENCOUNTER — Encounter: Payer: Self-pay | Admitting: Family

## 2015-05-10 ENCOUNTER — Ambulatory Visit (INDEPENDENT_AMBULATORY_CARE_PROVIDER_SITE_OTHER): Payer: Medicare Other | Admitting: Family

## 2015-05-10 VITALS — BP 165/83 | HR 63 | Temp 97.1°F | Ht 58.5 in | Wt 151.2 lb

## 2015-05-10 DIAGNOSIS — M109 Gout, unspecified: Secondary | ICD-10-CM

## 2015-05-10 DIAGNOSIS — M810 Age-related osteoporosis without current pathological fracture: Secondary | ICD-10-CM

## 2015-05-10 DIAGNOSIS — Z23 Encounter for immunization: Secondary | ICD-10-CM | POA: Diagnosis not present

## 2015-05-10 DIAGNOSIS — Z905 Acquired absence of kidney: Secondary | ICD-10-CM

## 2015-05-10 DIAGNOSIS — M170 Bilateral primary osteoarthritis of knee: Secondary | ICD-10-CM | POA: Diagnosis not present

## 2015-05-10 DIAGNOSIS — M129 Arthropathy, unspecified: Secondary | ICD-10-CM | POA: Diagnosis not present

## 2015-05-10 DIAGNOSIS — I10 Essential (primary) hypertension: Secondary | ICD-10-CM | POA: Diagnosis not present

## 2015-05-10 DIAGNOSIS — E785 Hyperlipidemia, unspecified: Secondary | ICD-10-CM | POA: Diagnosis not present

## 2015-05-10 DIAGNOSIS — M1712 Unilateral primary osteoarthritis, left knee: Secondary | ICD-10-CM

## 2015-05-10 MED ORDER — MELOXICAM 15 MG PO TABS: 15.0000 mg | ORAL_TABLET | Freq: Every day | ORAL | Status: DC

## 2015-05-10 MED ORDER — ATENOLOL 50 MG PO TABS: ORAL_TABLET | ORAL | Status: DC

## 2015-05-10 MED ORDER — AMLODIPINE BESYLATE 5 MG PO TABS: 5.0000 mg | ORAL_TABLET | Freq: Every day | ORAL | Status: DC

## 2015-05-10 MED ORDER — BENAZEPRIL HCL 40 MG PO TABS: 40.0000 mg | ORAL_TABLET | Freq: Every day | ORAL | Status: DC

## 2015-05-10 MED ORDER — LOVASTATIN 20 MG PO TABS: ORAL_TABLET | ORAL | Status: DC

## 2015-05-10 MED ORDER — FUROSEMIDE 40 MG PO TABS: ORAL_TABLET | ORAL | Status: DC

## 2015-05-10 NOTE — Progress Notes (Signed)
Subjective:    Patient ID: Paige Ferguson, female    DOB: [birth year], 81 y.o.   MRN: [id number]  Hypertension This is a chronic problem. The current episode started more than 1 year ago. The problem has been waxing and waning since onset. The problem is uncontrolled. Pertinent negatives include no anxiety, blurred vision, headaches, palpitations, peripheral edema or shortness of breath. Risk factors for coronary artery disease include dyslipidemia and post-menopausal state. Past treatments include beta blockers, ACE inhibitors and diuretics. The current treatment provides mild improvement. Hypertensive end-organ damage includes kidney disease and CVA. There is no history of CAD/MI, heart failure or a thyroid problem. There is no history of sleep apnea.  Hyperlipidemia This is a chronic problem. The current episode started more than 1 year ago. The problem is controlled. Recent lipid tests were reviewed and are normal. Paige Ferguson has no history of diabetes or hypothyroidism. Factors aggravating her hyperlipidemia include fatty foods. Pertinent negatives include no leg pain, myalgias or shortness of breath. Current antihyperlipidemic treatment includes statins. The current treatment provides moderate improvement of lipids. Risk factors for coronary artery disease include dyslipidemia, hypertension, post-menopausal and a sedentary lifestyle.  Arthritis Presents for follow-up visit. The disease course has been fluctuating. Paige Ferguson reports no pain or joint swelling. Affected locations include the right MCP, left MCP and left knee. Her pain is at a severity of 6/10. Pertinent negatives include no dry eyes, fever or rash. Her past medical history is significant for osteoarthritis. Her pertinent risk factors include overuse. Past treatments include NSAIDs and rest. The treatment provided moderate relief. Factors aggravating her arthritis include activity.  Gout Paige Ferguson is taking allopurinol daily. Paige Ferguson  States Paige Ferguson can't remember a  "bad flare up in awhile". Paige Ferguson states every once in awhile Paige Ferguson feels a "little pain in the base of her right great toe".     Review of Systems  Constitutional: Negative.  Negative for fever.  HENT: Negative.   Eyes: Negative.  Negative for blurred vision.  Respiratory: Negative.  Negative for shortness of breath.   Cardiovascular: Negative.  Negative for palpitations.  Gastrointestinal: Negative.   Endocrine: Negative.   Genitourinary: Negative.   Musculoskeletal: Positive for arthritis. Negative for myalgias and joint swelling.  Skin: Negative for rash.  Neurological: Negative.  Negative for headaches.  Hematological: Negative.   Psychiatric/Behavioral: Negative.   All other systems reviewed and are negative.      Objective:   Physical Exam  Constitutional: Paige Ferguson is oriented to person, place, and time. Paige Ferguson appears well-developed and well-nourished. No distress.  HENT:  Head: Normocephalic and atraumatic.  Right Ear: External ear normal.  Left Ear: External ear normal.  Nose: Nose normal.  Mouth/Throat: Oropharynx is clear and moist.  Eyes: Pupils are equal, round, and reactive to light.  Neck: Normal range of motion. Neck supple. No thyromegaly present.  Cardiovascular: Normal rate, regular rhythm, normal heart sounds and intact distal pulses.   No murmur heard. Pulmonary/Chest: Effort normal and breath sounds normal. No respiratory distress. Paige Ferguson has no wheezes.  Abdominal: Soft. Bowel sounds are normal. Paige Ferguson exhibits no distension. There is no tenderness.  Musculoskeletal: Normal range of motion. Paige Ferguson exhibits no edema or tenderness.  Neurological: Paige Ferguson is alert and oriented to person, place, and time. Paige Ferguson has normal reflexes. No cranial nerve deficit.  Skin: Skin is warm and dry.  Psychiatric: Paige Ferguson has a normal mood and affect. Her behavior is normal. Judgment and thought content normal.  Vitals reviewed.  BP 165/83 mmHg  Pulse 63  Temp(Src) 97.1 F (36.2 C)  Ht 4'  10.5" (1.486 m)  Wt 151 lb 3.2 oz (68.584 kg)  BMI 31.06 kg/m2     Assessment & Plan:  1. Primary osteoarthritis of both knees - meloxicam (MOBIC) 15 MG tablet; Take 1 tablet (15 mg total) by mouth daily.  Dispense: 90 tablet; Refill: 1 - CMP14+EGFR  2. Essential hypertension - CMP14+EGFR - amLODipine (NORVASC) 5 MG tablet; Take 1 tablet (5 mg total) by mouth daily.  Dispense: 90 tablet; Refill: 4 - atenolol (TENORMIN) 50 MG tablet; TAKE  (1)  TABLET TWICE A DAY.  Dispense: 180 tablet; Refill: 4 - benazepril (LOTENSIN) 40 MG tablet; Take 1 tablet (40 mg total) by mouth daily.  Dispense: 90 tablet; Refill: 4 - furosemide (LASIX) 40 MG tablet; Taken one and half tablet by muoth daily  Dispense: 135 tablet; Refill: 4  3. Arthritis of knee, left - CMP14+EGFR  4. Osteoporosis - CMP14+EGFR - Vit D  25 hydroxy (rtn osteoporosis monitoring)  5. Single kidney - CMP14+EGFR  6. Gout, unspecified cause, unspecified chronicity, unspecified site - CMP14+EGFR  7. Hyperlipidemia - CMP14+EGFR - Lipid panel - lovastatin (MEVACOR) 20 MG tablet; Take 1 tablet (20 mg total) by mouth at bedtime.  Dispense: 90 tablet; Refill: 4  8. Pneumococcal vaccination given - Pneumococcal conjugate vaccine 13-valent IM - CMP14+EGFR   Continue all meds Labs pending Health Maintenance reviewed Diet and exercise encouraged RTO 4 months  Evelina Dun, FNP

## 2015-05-10 NOTE — Patient Instructions (Signed)

## 2015-05-11 ENCOUNTER — Telehealth: Payer: Self-pay | Admitting: *Deleted

## 2015-05-11 LAB — CMP14+EGFR
ALT: 18 IU/L (ref 0–32)
AST: 29 IU/L (ref 0–40)
Albumin/Globulin Ratio: 1.5 (ref 1.1–2.5)
Albumin: 3.9 g/dL (ref 3.2–4.6)
Alkaline Phosphatase: 62 IU/L (ref 39–117)
BUN / CREAT RATIO: 28 — AB (ref 11–26)
BUN: 25 mg/dL (ref 10–36)
Bilirubin Total: 0.7 mg/dL (ref 0.0–1.2)
CALCIUM: 9.7 mg/dL (ref 8.7–10.3)
CHLORIDE: 103 mmol/L (ref 97–108)
CO2: 27 mmol/L (ref 18–29)
Creatinine, Ser: 0.89 mg/dL (ref 0.57–1.00)
GFR calc Af Amer: 65 mL/min/{1.73_m2} (ref >59)
GFR calc non Af Amer: 56 mL/min/{1.73_m2} — ABNORMAL LOW (ref >59)
GLOBULIN, TOTAL: 2.6 g/dL (ref 1.5–4.5)
Glucose: 94 mg/dL (ref 65–99)
POTASSIUM: 4.3 mmol/L (ref 3.5–5.2)
Sodium: 145 mmol/L — ABNORMAL HIGH (ref 134–144)
Total Protein: 6.5 g/dL (ref 6.0–8.5)

## 2015-05-11 LAB — LIPID PANEL
CHOL/HDL RATIO: 3.2 ratio (ref 0.0–4.4)
Cholesterol, Total: 144 mg/dL (ref 100–199)
HDL: 45 mg/dL (ref >39)
LDL CALC: 85 mg/dL (ref 0–99)
TRIGLYCERIDES: 72 mg/dL (ref 0–149)
VLDL Cholesterol Cal: 14 mg/dL (ref 5–40)

## 2015-05-11 LAB — VITAMIN D 25 HYDROXY (VIT D DEFICIENCY, FRACTURES): Vit D, 25-Hydroxy: 42.9 ng/mL (ref 30.0–100.0)

## 2015-05-11 NOTE — Telephone Encounter (Signed)
-----   Message from Sharion Balloon, [place] sent at 05/11/2015  8:23 AM EDT ----- Kidney and liver function stable Cholesterol levels WNL Vit D levels WNL

## 2015-05-11 NOTE — Telephone Encounter (Signed)
Patient notified of lab results

## 2015-05-28 ENCOUNTER — Ambulatory Visit (INDEPENDENT_AMBULATORY_CARE_PROVIDER_SITE_OTHER): Payer: Medicare Other | Admitting: Pharmacist

## 2015-05-28 ENCOUNTER — Encounter: Payer: Self-pay | Admitting: Pharmacist

## 2015-05-28 VITALS — BP 162/82 | HR 64 | Ht <= 58 in | Wt 148.5 lb

## 2015-05-28 DIAGNOSIS — Z0000 Encounter for general adult medical examination without abnormal findings: Secondary | ICD-10-CM | POA: Diagnosis not present

## 2015-05-28 DIAGNOSIS — I10 Essential (primary) hypertension: Secondary | ICD-10-CM

## 2015-05-28 MED ORDER — CARVEDILOL 6.25 MG PO TABS: 6.2500 mg | ORAL_TABLET | Freq: Two times a day (BID) | ORAL | Status: DC

## 2015-05-28 NOTE — Patient Instructions (Addendum)
  Paige Ferguson , Thank you for taking time to come for your Medicare Wellness Visit. I appreciate your ongoing commitment to your health goals. Please review the following plan we discussed and let me know if I can assist you in the future.   These are the goals we discussed: Goals    . Increase physical activity     Review handout of chair exercises you can do daily at home.      . Reduce sugar intake to X grams per day     Limit intake of sweets and sugar         Change atenolol to carvedilol 6.25mg  1 tablet twice a day  Bring stool sample back or mail in to check blood in stool  This is a list of the screening recommended for you and due dates:  Health Maintenance  Topic Date Due  . Shingles Vaccine  Now -  checked cost and would be $95  . Flu Shot  06/21/2015  . DEXA scan (bone density measurement)  12/02/2016  . Tetanus Vaccine  11/21/2019  . Colon Cancer Screening  03/01/2023  . Pneumonia vaccines  Completed  *Topic was postponed. The date shown is not the original due date.

## 2015-05-28 NOTE — Progress Notes (Signed)
Patient ID: Paige Ferguson, female   DOB: [birth year], 81 y.o.   MRN: [id number]    Subjective:   Paige Ferguson is a 81 y.o. female who presents for an Initial Medicare Annual Wellness Visit.  She is a WF.  Uses rolling walker for assistance with walking.  She has been widowed since 2012 but her adult grandson lives with her since 2011.    Patient c/o of right shoulder pain over the last 2-3 days.  She thinks she might have pulled a muscle when she was taking laundry out of the washing machine.  Pain is improving some.  Current Medications (verified) Outpatient Encounter Prescriptions as of 05/28/2015  Medication Sig  . allopurinol (ZYLOPRIM) 100 MG tablet Take 1 tablet (100 mg total) by mouth daily.  Marland Kitchen amLODipine (NORVASC) 5 MG tablet Take 1 tablet (5 mg total) by mouth daily.  . benazepril (LOTENSIN) 40 MG tablet Take 1 tablet (40 mg total) by mouth daily.  . fexofenadine (ALLEGRA) 30 MG tablet Take 1 tablet (30 mg total) by mouth daily. Over the counter  . furosemide (LASIX) 40 MG tablet Taken one and half tablet by [employer] daily  . lovastatin (MEVACOR) 20 MG tablet Take 1 tablet (20 mg total) by mouth at bedtime.  . meloxicam (MOBIC) 15 MG tablet Take 1 tablet (15 mg total) by mouth daily.  . Misc Natural Products (GNP GLUCOSAME CHONDROITIN DS) TABS Take by mouth.  . Misc. Devices (STEP N REST II WALKER) MISC by Does not apply route daily. PT HAS RX. FOR ROLLING WALKER WITH SEAT AND HANDBRAKES .   . Multiple Vitamins-Minerals (CENTRUM SILVER ADULT 50+) TABS Take 1 tablet by mouth daily.  . Omega-3 Fatty Acids (FISH OIL) 1000 MG CAPS Take 1 capsule by mouth 2 (two) times daily.  . raloxifene (EVISTA) 60 MG tablet TAKE 1 TABLET DAILY  . vitamin E 200 UNIT capsule Take 200 Units by mouth daily.  . [DISCONTINUED] atenolol (TENORMIN) 50 MG tablet TAKE  (1)  TABLET TWICE A DAY.  . carvedilol (COREG) 6.25 MG tablet Take 1 tablet (6.25 mg total) by mouth 2 (two) times daily with a meal.  .  HYDROcodone-homatropine (HYCODAN) 5-1.5 MG/5ML syrup Take 5 mLs by mouth every 8 (eight) hours as needed for cough. (Patient not taking: Reported on 05/28/2015)   No facility-administered encounter medications on file as of 05/28/2015.    Allergies (verified) Amoxicillin; Nitrofurantoin monohyd macro; and Sulfa antibiotics   History: Past Medical History  Diagnosis Date  . Recurrent UTI   . Ureteral calculi   . Hypertension   . Cholelithiases   . Postmenopausal bleeding   . Endometrial polyp 7/98    2 degree polyps   . Left knee pain   . Gout   . URI (upper respiratory infection)   . Genu valgum   . Hyperlipidemia   . Renal abscess, right   . Chronic kidney disease   . Osteoarthritis   . Arthritis of knee   . TIA (transient ischemic attack)   . Osteoporosis   . Allergy   . Shoulder fracture, left   . Cataract    Past Surgical History  Procedure Laterality Date  . Right nephrelithotomy  1982  . Incisional hernia repair  1983  . Right heminephrectomy for abscess  2/98  . Hysteroscopy , fractional d&c  7/98/  . Right cataract surgery  7/99    Dr. Dolores Lory   . Left cataract surgery  10/99  . Hernia repair    .  Eye surgery     Family History  Problem Relation Age of Onset  . Stroke Maternal Grandmother   . Hypertension Maternal Grandmother   . Diabetes Maternal Grandmother   . Coronary artery disease Paternal Grandfather   . Pneumonia Father    Social History   Occupational History  . Not on file.   Social History Main Topics  . Smoking status: Former Smoker    Types: Cigarettes    Quit date: 11/20/1944  . Smokeless tobacco: Never Used  . Alcohol Use: No  . Drug Use: No  . Sexual Activity: No    Do you feel safe at home?  Yes  Dietary issues and exercise activities: Current Exercise Habits:: The patient does not participate in regular exercise at present;Exercise is limited by, Limited by:: orthopedic condition(s)  Current Dietary habits:  Not following any  specific dietary restrictions or recommendations currently  Objective:    Today's Vitals   05/28/15 0901  BP: 162/82  Pulse: 64  Height: 4\' 9"  (1.448 m)  Weight: 148 lb 8 oz (67.359 kg)  PainSc: 2   PainLoc: Shoulder   Body mass index is 32.13 kg/(m^2).  Activities of Daily Living In your present state of health, do you have any difficulty performing the following activities: 05/28/2015  Hearing? N  Vision? N  Difficulty concentrating or making decisions? N  Walking or climbing stairs? Y  Dressing or bathing? N  Doing errands, shopping? Y  Preparing Food and eating ? N  Using the Toilet? N  In the past six months, have you accidently leaked urine? N  Do you have problems with loss of bowel control? N  Managing your Medications? N  Managing your Finances? N  Housekeeping or managing your Housekeeping? N    Are there smokers in your home (other than you)? No   Cardiac Risk Factors include: advanced age (>34men, >28 women);dyslipidemia;hypertension;obesity (BMI >30kg/m2);sedentary lifestyle  Depression Screen PHQ 2/9 Scores 05/28/2015 05/10/2015 12/02/2014 09/01/2014  PHQ - 2 Score 0 0 0 0    Fall Risk Fall Risk  05/28/2015 05/10/2015 12/02/2014 09/01/2014 04/28/2014  Falls in the past year? No No Yes Yes No  Number falls in past yr: - - 1 1 -  Injury with Fall? - - No No -  Risk for fall due to : Impaired balance/gait - History of fall(s);Impaired mobility Impaired balance/gait -   Patient uses rolling seat walker for mobility stability.  Cognitive Function: MMSE - Mini Mental State Exam 05/28/2015  Orientation to time 5  Orientation to Place 5  Registration 3  Attention/ Calculation 4  Recall 3  Language- name 2 objects 2  Language- repeat 1  Language- follow 3 step command 3  Language- read & follow direction 1  Write a sentence 1  Copy design 1  Total score 29    Immunizations and Health Maintenance Immunization History  Administered Date(s) Administered  .  Influenza,inj,Quad PF,36+ Mos 09/15/2013, 09/01/2014  . Pneumococcal Conjugate-13 05/10/2015  . Pneumococcal Polysaccharide-23 01/19/2007   There are no preventive care reminders to display for this patient.  Patient Care Team: Sharion Balloon, FNP as PCP - General (Nurse Practitioner) Irine Seal, MD as Attending Physician (Urology) Truc Manus Gunning, OD as Consulting Physician (Optometry)  Indicate any recent Medical Services you may have received from other than [facility] providers in the past year (date may be approximate).    Assessment:    Annual Wellness Visit  HTN Right shoulder pain   Screening  Tests Health Maintenance  Topic Date Due  . ZOSTAVAX  11/25/2015 (Originally 04/22/1983)  . INFLUENZA VACCINE  06/21/2015  . DEXA SCAN  12/02/2016  . TETANUS/TDAP  11/21/2019  . COLONOSCOPY  03/01/2023  . PNA vac Low Risk Adult  Completed      Plan:   During the course of the visit Errika was educated and counseled about the following appropriate screening and preventive services:   Vaccines to include Pneumoccal, Influenza, Hepatitis B, Td, Zostavax - patient is UTD on all vaccines   Colorectal cancer screening - colonoscopy UTD;  Patient has FOBT at home but has forgotten to do test and return - reminded to do so.  Cardiovascular disease screening - EKG UTD;  BP elevated today.  Change atenolol to carvedilol 6.25mg  1 tablet bid (better BP lowering beta blocker).  Patient will RTC in 1 month to recheck BP.  Taking statin - last LDL at goal.   Diabetes screening - UTD  Bone Denisty / Osteoporosis Screening - UTD  Mammogram - patient refuses  Glaucoma screening / Diabetic Eye Exam - UTD - requesting last visit from Dr Rona Ravens  Nutrition counseling - discussed health eating habits - limit high CHO and sugar containing foods, increase whole grains and lean proteins.  Limit fat and red meats.   Physical activity - handout given and reviewed for Chair Exercises.  Use heat off and on for  right shoulder pain over the next 24 hours - if no improvement then contact office for further evaluation.  Advanced Directives - UTD   Goals    . Increase physical activity     Review handout of chair exercises you can do daily at home.      . Reduce sugar intake to X grams per day     Limit intake of sweets and sugar         Patient Instructions (the written plan) were given to the patient.   Cherre Robins, [facility]   05/28/2015

## 2015-06-08 ENCOUNTER — Other Ambulatory Visit: Payer: Medicare Other

## 2015-06-09 ENCOUNTER — Other Ambulatory Visit: Payer: Medicare Other

## 2015-06-09 DIAGNOSIS — Z1212 Encounter for screening for malignant neoplasm of rectum: Secondary | ICD-10-CM | POA: Diagnosis not present

## 2015-06-10 ENCOUNTER — Encounter: Payer: Self-pay | Admitting: *Deleted

## 2015-06-11 LAB — FECAL OCCULT BLOOD, IMMUNOCHEMICAL: FECAL OCCULT BLD: NEGATIVE

## 2015-06-21 ENCOUNTER — Ambulatory Visit: Payer: Medicare Other

## 2015-06-30 ENCOUNTER — Ambulatory Visit (INDEPENDENT_AMBULATORY_CARE_PROVIDER_SITE_OTHER): Payer: Medicare Other | Admitting: Pharmacist

## 2015-06-30 VITALS — BP 142/76 | HR 68

## 2015-06-30 DIAGNOSIS — I10 Essential (primary) hypertension: Secondary | ICD-10-CM | POA: Diagnosis not present

## 2015-06-30 NOTE — Progress Notes (Signed)
Patient ID: Paige Ferguson, female   DOB: 1920-12-10, 81 y.o.   MRN: [id number]  Subjective:    Patient here for follow-up of elevated blood pressure.  She is doing some chair exercises but is not able to do more extensive exercise due to ortho conditions. She  is adherent to a low-salt diet.  Patient does not check BP at home. Cardiac symptoms: fatigue which she states has neither improved or worsened since changed from atenolol to carvedilol.  Patient denies: chest pain, dyspnea, irregular heart beat and palpitations. Cardiovascular risk factors: advanced age (older than 72 for men, 13 for women), hypertension, obesity (BMI >= 30 kg/m2) and sedentary lifestyle. Use of agents associated with hypertension: NSAIDS. History of target organ damage: transient ischemic attack.  The following portions of the patient's history were reviewed and updated as appropriate: allergies, current medications, past family history, past medical history, past social history, past surgical history and problem list.   Objective:   Filed Vitals:   06/30/15 1100  BP: 142/76  Pulse: 68    Assessment:    Hypertension, improving with recent change in beta blockers   Plan:    Medication: continue with curent BP medications. Dietary sodium restriction. Follow up: 4 weeks and as needed.    Cherre Robins, PharmD, CPP

## 2015-06-30 NOTE — Patient Instructions (Signed)
DASH Eating Plan °DASH stands for "Dietary Approaches to Stop Hypertension." The DASH eating plan is a healthy eating plan that has been shown to reduce high blood pressure (hypertension). Additional health benefits may include reducing the risk of type 2 diabetes mellitus, heart disease, and stroke. The DASH eating plan may also help with weight loss. °WHAT DO I NEED TO KNOW ABOUT THE DASH EATING PLAN? °For the DASH eating plan, you will follow these general guidelines: °· Choose foods with a percent daily value for sodium of less than 5% (as listed on the food label). °· Use salt-free seasonings or herbs instead of table salt or sea salt. °· Check with your health care provider or pharmacist before using salt substitutes. °· Eat lower-sodium products, often labeled as "lower sodium" or "no salt added." °· Eat fresh foods. °· Eat more vegetables, fruits, and low-fat dairy products. °· Choose whole grains. Look for the word "whole" as the first word in the ingredient list. °· Choose fish and skinless chicken or turkey more often than red meat. Limit fish, poultry, and meat to 6 oz (170 g) each day. °· Limit sweets, desserts, sugars, and sugary drinks. °· Choose heart-healthy fats. °· Limit cheese to 1 oz (28 g) per day. °· Eat more home-cooked food and less restaurant, buffet, and fast food. °· Limit fried foods. °· Cook foods using methods other than frying. °· Limit canned vegetables. If you do use them, rinse them well to decrease the sodium. °· When eating at a restaurant, ask that your food be prepared with less salt, or no salt if possible. °WHAT FOODS CAN I EAT? °Seek help from a dietitian for individual calorie needs. °Grains °Whole grain or whole wheat bread. Brown rice. Whole grain or whole wheat pasta. Quinoa, bulgur, and whole grain cereals. Low-sodium cereals. Corn or whole wheat flour tortillas. Whole grain cornbread. Whole grain crackers. Low-sodium crackers. °Vegetables °Fresh or frozen vegetables  (raw, steamed, roasted, or grilled). Low-sodium or reduced-sodium tomato and vegetable juices. Low-sodium or reduced-sodium tomato sauce and paste. Low-sodium or reduced-sodium canned vegetables.  °Fruits °All fresh, canned (in natural juice), or frozen fruits. °Meat and Other Protein Products °Ground beef (85% or leaner), grass-fed beef, or beef trimmed of fat. Skinless chicken or turkey. Ground chicken or turkey. Pork trimmed of fat. All fish and seafood. Eggs. Dried beans, peas, or lentils. Unsalted nuts and seeds. Unsalted canned beans. °Dairy °Low-fat dairy products, such as skim or 1% milk, 2% or reduced-fat cheeses, low-fat ricotta or cottage cheese, or plain low-fat yogurt. Low-sodium or reduced-sodium cheeses. °Fats and Oils °Tub margarines without trans fats. Light or reduced-fat mayonnaise and salad dressings (reduced sodium). Avocado. Safflower, olive, or canola oils. Natural peanut or almond butter. °Other °Unsalted popcorn and pretzels. °The items listed above may not be a complete list of recommended foods or beverages. Contact your dietitian for more options. °WHAT FOODS ARE NOT RECOMMENDED? °Grains °White bread. White pasta. White rice. Refined cornbread. Bagels and croissants. Crackers that contain trans fat. °Vegetables °Creamed or fried vegetables. Vegetables in a cheese sauce. Regular canned vegetables. Regular canned tomato sauce and paste. Regular tomato and vegetable juices. °Fruits °Dried fruits. Canned fruit in light or heavy syrup. Fruit juice. °Meat and Other Protein Products °Fatty cuts of meat. Ribs, chicken wings, bacon, sausage, bologna, salami, chitterlings, fatback, hot dogs, bratwurst, and packaged luncheon meats. Salted nuts and seeds. Canned beans with salt. °Dairy °Whole or 2% milk, cream, half-and-half, and cream cheese. Whole-fat or sweetened yogurt. Full-fat   cheeses or blue cheese. Nondairy creamers and whipped toppings. Processed cheese, cheese spreads, or cheese  curds. °Condiments °Onion and garlic salt, seasoned salt, table salt, and sea salt. Canned and packaged gravies. Worcestershire sauce. Tartar sauce. Barbecue sauce. Teriyaki sauce. Soy sauce, including reduced sodium. Steak sauce. Fish sauce. Oyster sauce. Cocktail sauce. Horseradish. Ketchup and mustard. Meat flavorings and tenderizers. Bouillon cubes. Hot sauce. Tabasco sauce. Marinades. Taco seasonings. Relishes. °Fats and Oils °Butter, stick margarine, lard, shortening, ghee, and bacon fat. Coconut, palm kernel, or palm oils. Regular salad dressings. °Other °Pickles and olives. Salted popcorn and pretzels. °The items listed above may not be a complete list of foods and beverages to avoid. Contact your dietitian for more information. °WHERE CAN I FIND MORE INFORMATION? °National [employer], [employer], [employer]: www.nhlbi.nih.gov/health/health-topics/topics/dash/ °Document Released: 10/26/2011 Document Revised: 03/23/2014 Document Reviewed: 09/10/2013 °[employer]® Patient Information ©2015 [employer]. This information is not intended to replace advice given to you by your health care provider. Make sure you discuss any questions you have with your health care provider. ° °

## 2015-08-06 ENCOUNTER — Ambulatory Visit (INDEPENDENT_AMBULATORY_CARE_PROVIDER_SITE_OTHER): Payer: Medicare Other | Admitting: Urology

## 2015-08-06 DIAGNOSIS — N3020 Other chronic cystitis without hematuria: Secondary | ICD-10-CM | POA: Diagnosis not present

## 2015-08-06 DIAGNOSIS — Z905 Acquired absence of kidney: Secondary | ICD-10-CM

## 2015-08-06 DIAGNOSIS — N3941 Urge incontinence: Secondary | ICD-10-CM

## 2015-09-09 ENCOUNTER — Encounter: Payer: Self-pay | Admitting: Family

## 2015-09-09 ENCOUNTER — Ambulatory Visit (INDEPENDENT_AMBULATORY_CARE_PROVIDER_SITE_OTHER): Payer: Medicare Other | Admitting: Family

## 2015-09-09 VITALS — BP 141/80 | HR 66 | Temp 97.9°F | Ht <= 58 in | Wt 147.2 lb

## 2015-09-09 DIAGNOSIS — M109 Gout, unspecified: Secondary | ICD-10-CM | POA: Diagnosis not present

## 2015-09-09 DIAGNOSIS — R600 Localized edema: Secondary | ICD-10-CM

## 2015-09-09 DIAGNOSIS — R609 Edema, unspecified: Secondary | ICD-10-CM | POA: Diagnosis not present

## 2015-09-09 DIAGNOSIS — Z905 Acquired absence of kidney: Secondary | ICD-10-CM

## 2015-09-09 DIAGNOSIS — G459 Transient cerebral ischemic attack, unspecified: Secondary | ICD-10-CM

## 2015-09-09 DIAGNOSIS — M129 Arthropathy, unspecified: Secondary | ICD-10-CM | POA: Diagnosis not present

## 2015-09-09 DIAGNOSIS — M170 Bilateral primary osteoarthritis of knee: Secondary | ICD-10-CM | POA: Diagnosis not present

## 2015-09-09 DIAGNOSIS — E785 Hyperlipidemia, unspecified: Secondary | ICD-10-CM

## 2015-09-09 DIAGNOSIS — M810 Age-related osteoporosis without current pathological fracture: Secondary | ICD-10-CM | POA: Diagnosis not present

## 2015-09-09 DIAGNOSIS — Z23 Encounter for immunization: Secondary | ICD-10-CM

## 2015-09-09 DIAGNOSIS — I10 Essential (primary) hypertension: Secondary | ICD-10-CM

## 2015-09-09 DIAGNOSIS — M1712 Unilateral primary osteoarthritis, left knee: Secondary | ICD-10-CM

## 2015-09-09 MED ORDER — ALLOPURINOL 100 MG PO TABS: 100.0000 mg | ORAL_TABLET | Freq: Every day | ORAL | Status: DC

## 2015-09-09 MED ORDER — MELOXICAM 15 MG PO TABS: 15.0000 mg | ORAL_TABLET | Freq: Every day | ORAL | Status: DC

## 2015-09-09 MED ORDER — LOVASTATIN 20 MG PO TABS: ORAL_TABLET | ORAL | Status: DC

## 2015-09-09 MED ORDER — CARVEDILOL 6.25 MG PO TABS: 6.2500 mg | ORAL_TABLET | Freq: Two times a day (BID) | ORAL | Status: DC

## 2015-09-09 MED ORDER — AMLODIPINE BESYLATE 5 MG PO TABS: 5.0000 mg | ORAL_TABLET | Freq: Every day | ORAL | Status: DC

## 2015-09-09 MED ORDER — FUROSEMIDE 40 MG PO TABS: ORAL_TABLET | ORAL | Status: DC

## 2015-09-09 MED ORDER — RALOXIFENE HCL 60 MG PO TABS: ORAL_TABLET | ORAL | Status: DC

## 2015-09-09 MED ORDER — BENAZEPRIL HCL 40 MG PO TABS: 40.0000 mg | ORAL_TABLET | Freq: Every day | ORAL | Status: DC

## 2015-09-09 NOTE — Patient Instructions (Signed)
Arthritis Arthritis is a term that is commonly used to refer to joint pain or joint disease. There are more than 100 types of arthritis. CAUSES The most common cause of this condition is wear and tear of a joint. Other causes include:  Gout.  Inflammation of a joint.  An infection of a joint.  Sprains and other injuries near the joint.  A drug reaction or allergic reaction. In some cases, the cause may not be known. SYMPTOMS The main symptom of this condition is pain in the joint with movement. Other symptoms include:  Redness, swelling, or stiffness at a joint.  Warmth coming from the joint.  Fever.  Overall feeling of illness. DIAGNOSIS This condition may be diagnosed with a physical exam and tests, including:  Blood tests.  Urine tests.  Imaging tests, such as MRI, X-rays, or a CT scan. Sometimes, fluid is removed from a joint for testing. TREATMENT Treatment for this condition may involve:  Treatment of the cause, if it is known.  Rest.  Raising (elevating) the joint.  Applying cold or hot packs to the joint.  Medicines to improve symptoms and reduce inflammation.  Injections of a steroid such as cortisone into the joint to help reduce pain and inflammation. Depending on the cause of your arthritis, you may need to make lifestyle changes to reduce stress on your joint. These changes may include exercising more and losing weight. HOME CARE INSTRUCTIONS Medicines  Take over-the-counter and prescription medicines only as told by your health care provider.  Do not take aspirin to relieve pain if gout is suspected. Activities  Rest your joint if told by your health care provider. Rest is important when your disease is active and your joint feels painful, swollen, or stiff.  Avoid activities that make the pain worse. It is important to balance activity with rest.  Exercise your joint regularly with range-of-motion exercises as told by your health care  provider. Try doing low-impact exercise, such as:  Swimming.  Water aerobics.  Biking.  Walking. Joint Care  If your joint is swollen, keep it elevated if told by your health care provider.  If your joint feels stiff in the morning, try taking a warm shower.  If directed, apply heat to the joint. If you have diabetes, do not apply heat without permission from your health care provider.  Put a towel between the joint and the hot pack or heating pad.  Leave the heat on the area for 20-30 minutes.  If directed, apply ice to the joint:  Put ice in a plastic bag.  Place a towel between your skin and the bag.  Leave the ice on for 20 minutes, 2-3 times per day.  Keep all follow-up visits as told by your health care provider. This is important. SEEK MEDICAL CARE IF:  The pain gets worse.  You have a fever. SEEK IMMEDIATE MEDICAL CARE IF:  You develop severe joint pain, swelling, or redness.  Many joints become painful and swollen.  You develop severe back pain.  You develop severe weakness in your leg.  You cannot control your bladder or bowels.   This information is not intended to replace advice given to you by your health care provider. Make sure you discuss any questions you have with your health care provider.   Document Released: 12/14/2004 Document Revised: 07/28/2015 Document Reviewed: 02/01/2015 Elsevier Interactive Patient Education [employer]. Menopause is a normal process in which your reproductive ability comes to an end. This  process happens gradually over a span of months to years, usually between the ages of 74 and 45. Menopause is complete when you have missed 12 consecutive menstrual periods. It is important to talk with your health care provider about some of the most common conditions that affect postmenopausal women, such as heart disease, cancer, and bone loss (osteoporosis). Adopting a healthy lifestyle and getting preventive care can help  to promote your health and wellness. Those actions can also lower your chances of developing some of these common conditions. WHAT SHOULD I KNOW ABOUT MENOPAUSE? During menopause, you may experience a number of symptoms, such as:  Moderate-to-severe hot flashes.  Night sweats.  Decrease in sex drive.  Mood swings.  Headaches.  Tiredness.  Irritability.  Memory problems.  Insomnia. Choosing to treat or not to treat menopausal changes is an individual decision that you make with your health care provider. WHAT SHOULD I KNOW ABOUT HORMONE REPLACEMENT THERAPY AND SUPPLEMENTS? Hormone therapy products are effective for treating symptoms that are associated with menopause, such as hot flashes and night sweats. Hormone replacement carries certain risks, especially as you become older. If you are thinking about using estrogen or estrogen with progestin treatments, discuss the benefits and risks with your health care provider. WHAT SHOULD I KNOW ABOUT HEART DISEASE AND STROKE? Heart disease, heart attack, and stroke become more likely as you age. This may be due, in part, to the hormonal changes that your body experiences during menopause. These can affect how your body processes dietary fats, triglycerides, and cholesterol. Heart attack and stroke are both medical emergencies. There are many things that you can do to help prevent heart disease and stroke:  Have your blood pressure checked at least every 1-2 years. High blood pressure causes heart disease and increases the risk of stroke.  If you are 70-19 years old, ask your health care provider if you should take aspirin to prevent a heart attack or a stroke.  Do not use any tobacco products, including cigarettes, chewing tobacco, or electronic cigarettes. If you need help quitting, ask your health care provider.  It is important to eat a healthy diet and maintain a healthy weight.  Be sure to include plenty of vegetables, fruits,  low-fat dairy products, and lean protein.  Avoid eating foods that are high in solid fats, added sugars, or salt (sodium).  Get regular exercise. This is one of the most important things that you can do for your health.  Try to exercise for at least 150 minutes each week. The type of exercise that you do should increase your heart rate and make you sweat. This is known as moderate-intensity exercise.  Try to do strengthening exercises at least twice each week. Do these in addition to the moderate-intensity exercise.  Know your numbers.Ask your health care provider to check your cholesterol and your blood glucose. Continue to have your blood tested as directed by your health care provider. WHAT SHOULD I KNOW ABOUT CANCER SCREENING? There are several types of cancer. Take the following steps to reduce your risk and to catch any cancer development as early as possible. Breast Cancer  Practice breast self-awareness.  This means understanding how your breasts normally appear and feel.  It also means doing regular breast self-exams. Let your health care provider know about any changes, no matter how small.  If you are 25 or older, have a clinician do a breast exam (clinical breast exam or CBE) every year. Depending on your age, family  history, and medical history, it may be recommended that you also have a yearly breast X-ray (mammogram).  If you have a family history of breast cancer, talk with your health care provider about genetic screening.  If you are at high risk for breast cancer, talk with your health care provider about having an MRI and a mammogram every year.  Breast cancer (BRCA) gene test is recommended for women who have family members with BRCA-related cancers. Results of the assessment will determine the need for genetic counseling and BRCA1 and for BRCA2 testing. BRCA-related cancers include these types:  Breast. This occurs in males or females.  Ovarian.  Tubal. This  may also be called fallopian tube cancer.  Cancer of the abdominal or pelvic lining (peritoneal cancer).  Prostate.  Pancreatic. Cervical, Uterine, and Ovarian Cancer Your health care provider may recommend that you be screened regularly for cancer of the pelvic organs. These include your ovaries, uterus, and vagina. This screening involves a pelvic exam, which includes checking for microscopic changes to the surface of your cervix (Pap test).  For women ages 21-65, health care providers may recommend a pelvic exam and a Pap test every three years. For women ages 47-65, they may recommend the Pap test and pelvic exam, combined with testing for human papilloma virus (HPV), every five years. Some types of HPV increase your risk of cervical cancer. Testing for HPV may also be done on women of any age who have unclear Pap test results.  Other health care providers may not recommend any screening for nonpregnant women who are considered low risk for pelvic cancer and have no symptoms. Ask your health care provider if a screening pelvic exam is right for you.  If you have had past treatment for cervical cancer or a condition that could lead to cancer, you need Pap tests and screening for cancer for at least 20 years after your treatment. If Pap tests have been discontinued for you, your risk factors (such as having a new sexual partner) need to be reassessed to determine if you should start having screenings again. Some women have medical problems that increase the chance of getting cervical cancer. In these cases, your health care provider may recommend that you have screening and Pap tests more often.  If you have a family history of uterine cancer or ovarian cancer, talk with your health care provider about genetic screening.  If you have vaginal bleeding after reaching menopause, tell your health care provider.  There are currently no reliable tests available to screen for ovarian cancer. Lung  Cancer Lung cancer screening is recommended for adults 21-24 years old who are at high risk for lung cancer because of a history of smoking. A yearly low-dose CT scan of the lungs is recommended if you:  Currently smoke.  Have a history of at least 30 pack-years of smoking and you currently smoke or have quit within the past 15 years. A pack-year is smoking an average of one pack of cigarettes per day for one year. Yearly screening should:  Continue until it has been 15 years since you quit.  Stop if you develop a health problem that would prevent you from having lung cancer treatment. Colorectal Cancer  This type of cancer can be detected and can often be prevented.  Routine colorectal cancer screening usually begins at age 18 and continues through age 44.  If you have risk factors for colon cancer, your health care provider may recommend that you be  screened at an earlier age.  If you have a family history of colorectal cancer, talk with your health care provider about genetic screening.  Your health care provider may also recommend using home test kits to check for hidden blood in your stool.  A small camera at the end of a tube can be used to examine your colon directly (sigmoidoscopy or colonoscopy). This is done to check for the earliest forms of colorectal cancer.  Direct examination of the colon should be repeated every 5-10 years until age 63. However, if early forms of precancerous polyps or small growths are found or if you have a family history or genetic risk for colorectal cancer, you may need to be screened more often. Skin Cancer  Check your skin from head to toe regularly.  Monitor any moles. Be sure to tell your health care provider:  About any new moles or changes in moles, especially if there is a change in a mole's shape or color.  If you have a mole that is larger than the size of a pencil eraser.  If any of your family members has a history of skin cancer,  especially at a young age, talk with your health care provider about genetic screening.  Always use sunscreen. Apply sunscreen liberally and repeatedly throughout the day.  Whenever you are outside, protect yourself by wearing long sleeves, pants, a wide-brimmed hat, and sunglasses. WHAT SHOULD I KNOW ABOUT OSTEOPOROSIS? Osteoporosis is a condition in which bone destruction happens more quickly than new bone creation. After menopause, you may be at an increased risk for osteoporosis. To help prevent osteoporosis or the bone fractures that can happen because of osteoporosis, the following is recommended:  If you are 24-15 years old, get at least 1,000 mg of calcium and at least 600 mg of vitamin D per day.  If you are older than age 89 but younger than age 73, get at least 1,200 mg of calcium and at least 600 mg of vitamin D per day.  If you are older than age 24, get at least 1,200 mg of calcium and at least 800 mg of vitamin D per day. Smoking and excessive alcohol intake increase the risk of osteoporosis. Eat foods that are rich in calcium and vitamin D, and do weight-bearing exercises several times each week as directed by your health care provider. WHAT SHOULD I KNOW ABOUT HOW MENOPAUSE AFFECTS [place]? Depression may occur at any age, but it is more common as you become older. Common symptoms of depression include:  Low or sad mood.  Changes in sleep patterns.  Changes in appetite or eating patterns.  Feeling an overall lack of motivation or enjoyment of activities that you previously enjoyed.  Frequent crying spells. Talk with your health care provider if you think that you are experiencing depression. WHAT SHOULD I KNOW ABOUT IMMUNIZATIONS? It is important that you get and maintain your immunizations. These include:  Tetanus, diphtheria, and pertussis (Tdap) booster vaccine.  Influenza every year before the flu season begins.  Pneumonia vaccine.  Shingles  vaccine. Your health care provider may also recommend other immunizations.   This information is not intended to replace advice given to you by your health care provider. Make sure you discuss any questions you have with your health care provider.   Document Released: 12/29/2005 Document Revised: 11/27/2014 Document Reviewed: 07/09/2014 Elsevier Interactive Patient Education [employer].

## 2015-09-09 NOTE — Progress Notes (Signed)
Subjective:    Patient ID: Paige Ferguson, female    DOB: [birth year], 81 y.o.   MRN: [id number]  Pt presents to the office today for chronic follow up. Pt states she saw Dr. Jeffie Pollock in September and usually see's him yearly, but was told this time that she was good and did not need to follow up because all of her labs were "good".   Hypertension This is a chronic problem. The current episode started more than 1 year ago. The problem has been waxing and waning since onset. The problem is uncontrolled. Pertinent negatives include no anxiety, headaches, palpitations, peripheral edema or shortness of breath. Risk factors for coronary artery disease include dyslipidemia and post-menopausal state. Past treatments include beta blockers, ACE inhibitors and diuretics. The current treatment provides mild improvement. Hypertensive end-organ damage includes kidney disease and CVA. There is no history of CAD/MI, heart failure or a thyroid problem. There is no history of sleep apnea.  Hyperlipidemia This is a chronic problem. The current episode started more than 1 year ago. The problem is controlled. Recent lipid tests were reviewed and are normal. She has no history of diabetes or hypothyroidism. Factors aggravating her hyperlipidemia include fatty foods. Pertinent negatives include no leg pain, myalgias or shortness of breath. Current antihyperlipidemic treatment includes statins. The current treatment provides moderate improvement of lipids. Risk factors for coronary artery disease include dyslipidemia, hypertension, post-menopausal and a sedentary lifestyle.  Arthritis Presents for follow-up visit. The disease course has been fluctuating. She reports no pain or joint swelling. Affected locations include the right MCP, left MCP, left knee and left wrist. Her pain is at a severity of 6/10. Pertinent negatives include no dry eyes, fever or rash. Her past medical history is significant for osteoarthritis. Her pertinent  risk factors include overuse. Past treatments include NSAIDs and rest. The treatment provided moderate relief. Factors aggravating her arthritis include activity.      Review of Systems  Constitutional: Negative.  Negative for fever.  HENT: Negative.   Eyes: Negative.   Respiratory: Negative.  Negative for shortness of breath.   Cardiovascular: Negative.  Negative for palpitations.  Gastrointestinal: Negative.   Endocrine: Negative.   Genitourinary: Negative.   Musculoskeletal: Positive for arthritis. Negative for myalgias and joint swelling.  Skin: Negative for rash.  Neurological: Negative.  Negative for headaches.  Hematological: Negative.   Psychiatric/Behavioral: Negative.   All other systems reviewed and are negative.      Objective:   Physical Exam  Constitutional: She is oriented to person, place, and time. She appears well-developed and well-nourished. No distress.  HENT:  Head: Normocephalic and atraumatic.  Right Ear: External ear normal.  Left Ear: External ear normal.  Nose: Nose normal.  Mouth/Throat: Oropharynx is clear and moist.  Eyes: Pupils are equal, round, and reactive to light.  Neck: Normal range of motion. Neck supple. No thyromegaly present.  Cardiovascular: Normal rate, regular rhythm, normal heart sounds and intact distal pulses.   No murmur heard. Pulmonary/Chest: Effort normal and breath sounds normal. No respiratory distress. She has no wheezes.  Abdominal: Soft. Bowel sounds are normal. She exhibits no distension. There is no tenderness.  Musculoskeletal: Normal range of motion. She exhibits edema (2+ in lower ankles). She exhibits no tenderness.  Neurological: She is alert and oriented to person, place, and time. She has normal reflexes. No cranial nerve deficit.  Skin: Skin is warm and dry.  Psychiatric: She has a normal mood and affect. Her behavior is normal.  Judgment and thought content normal.  Vitals reviewed.     BP 155/73 mmHg   Pulse 68  Temp(Src) 97.9 F (36.6 C) (Oral)  Ht _0  (1.448 m)  Wt 147 lb 3.2 oz (66.769 kg)  BMI 31.84 kg/m2     Assessment & Plan:  1. Gout without tophus, unspecified cause, unspecified chronicity, unspecified site - allopurinol (ZYLOPRIM) 100 MG tablet; Take 1 tablet (100 mg total) by mouth daily.  Dispense: 90 tablet; Refill: 4 - CMP14+EGFR  2. Gout, unspecified cause, unspecified chronicity, unspecified site - CMP14+EGFR  3. Essential hypertension - amLODipine (NORVASC) 5 MG tablet; Take 1 tablet (5 mg total) by mouth daily.  Dispense: 90 tablet; Refill: 4 - benazepril (LOTENSIN) 40 MG tablet; Take 1 tablet (40 mg total) by mouth daily.  Dispense: 90 tablet; Refill: 4 - carvedilol (COREG) 6.25 MG tablet; Take 1 tablet (6.25 mg total) by mouth 2 (two) times daily with a meal.  Dispense: 180 tablet; Refill: 1 - furosemide (LASIX) 40 MG tablet; Taken one and half tablet by muoth daily  Dispense: 135 tablet; Refill: 4 - CMP14+EGFR  4. Hyperlipidemia - lovastatin (MEVACOR) 20 MG tablet; Take 1 tablet (20 mg total) by mouth at bedtime.  Dispense: 90 tablet; Refill: 4 - CMP14+EGFR  5. Primary osteoarthritis of both knees - CMP14+EGFR  6. Osteoporosis - raloxifene (EVISTA) 60 MG tablet; TAKE 1 TABLET DAILY  Dispense: 90 tablet; Refill: 4 - CMP14+EGFR  7. Transient cerebral ischemia, unspecified transient cerebral ischemia type - CMP14+EGFR  8. Arthritis of knee, left - meloxicam (MOBIC) 15 MG tablet; Take 1 tablet (15 mg total) by mouth daily.  Dispense: 90 tablet; Refill: 1 - CMP14+EGFR  9. Single kidney - CMP14+EGFR  10. Peripheral edema   Continue all meds Labs pending Health Maintenance reviewed- Flu vaccine given today Diet and exercise encouraged RTO 3 months  Evelina Dun, FNP

## 2015-09-10 LAB — CMP14+EGFR
ALT: 13 IU/L (ref 0–32)
AST: 25 IU/L (ref 0–40)
Albumin/Globulin Ratio: 1.7 (ref 1.1–2.5)
Albumin: 4.1 g/dL (ref 3.2–4.6)
Alkaline Phosphatase: 59 IU/L (ref 39–117)
BUN/Creatinine Ratio: 19 (ref 11–26)
BUN: 21 mg/dL (ref 10–36)
Bilirubin Total: 0.7 mg/dL (ref 0.0–1.2)
CALCIUM: 9.8 mg/dL (ref 8.7–10.3)
CO2: 29 mmol/L (ref 18–29)
CREATININE: 1.08 mg/dL — AB (ref 0.57–1.00)
Chloride: 98 mmol/L (ref 97–106)
GFR, EST AFRICAN AMERICAN: 51 mL/min/{1.73_m2} — AB (ref >59)
GFR, EST NON AFRICAN AMERICAN: 45 mL/min/{1.73_m2} — AB (ref >59)
GLUCOSE: 77 mg/dL (ref 65–99)
Globulin, Total: 2.4 g/dL (ref 1.5–4.5)
Potassium: 4.2 mmol/L (ref 3.5–5.2)
Sodium: 143 mmol/L (ref 136–144)
TOTAL PROTEIN: 6.5 g/dL (ref 6.0–8.5)

## 2016-01-10 ENCOUNTER — Encounter: Payer: Self-pay | Admitting: Family

## 2016-01-10 ENCOUNTER — Ambulatory Visit (INDEPENDENT_AMBULATORY_CARE_PROVIDER_SITE_OTHER): Payer: Medicare Other | Admitting: Family

## 2016-01-10 VITALS — BP 153/76 | HR 67 | Temp 97.2°F | Ht <= 58 in | Wt 142.6 lb

## 2016-01-10 DIAGNOSIS — Z905 Acquired absence of kidney: Secondary | ICD-10-CM | POA: Diagnosis not present

## 2016-01-10 DIAGNOSIS — M129 Arthropathy, unspecified: Secondary | ICD-10-CM | POA: Diagnosis not present

## 2016-01-10 DIAGNOSIS — E785 Hyperlipidemia, unspecified: Secondary | ICD-10-CM | POA: Diagnosis not present

## 2016-01-10 DIAGNOSIS — M1712 Unilateral primary osteoarthritis, left knee: Secondary | ICD-10-CM

## 2016-01-10 DIAGNOSIS — M109 Gout, unspecified: Secondary | ICD-10-CM | POA: Diagnosis not present

## 2016-01-10 DIAGNOSIS — M170 Bilateral primary osteoarthritis of knee: Secondary | ICD-10-CM | POA: Diagnosis not present

## 2016-01-10 DIAGNOSIS — K649 Unspecified hemorrhoids: Secondary | ICD-10-CM

## 2016-01-10 DIAGNOSIS — M810 Age-related osteoporosis without current pathological fracture: Secondary | ICD-10-CM

## 2016-01-10 DIAGNOSIS — I10 Essential (primary) hypertension: Secondary | ICD-10-CM | POA: Diagnosis not present

## 2016-01-10 MED ORDER — CARVEDILOL 6.25 MG PO TABS: 6.2500 mg | ORAL_TABLET | Freq: Two times a day (BID) | ORAL | Status: DC

## 2016-01-10 MED ORDER — MELOXICAM 15 MG PO TABS: 15.0000 mg | ORAL_TABLET | Freq: Every day | ORAL | Status: DC

## 2016-01-10 NOTE — Patient Instructions (Signed)

## 2016-01-10 NOTE — Progress Notes (Signed)
Subjective:    Patient ID: Paige Ferguson, female    DOB: March 05, 1921, 82 y.o.   MRN: [id number]  Pt presents to the office today for chronic follow up.   Hypertension This is a chronic problem. The current episode started more than 1 year ago. The problem has been waxing and waning since onset. The problem is uncontrolled. Associated symptoms include peripheral edema ("a little"). Pertinent negatives include no anxiety, headaches, palpitations or shortness of breath. Risk factors for coronary artery disease include dyslipidemia and post-menopausal state. Past treatments include beta blockers, ACE inhibitors, diuretics and calcium channel blockers. The current treatment provides mild improvement. Hypertensive end-organ damage includes kidney disease and CVA. There is no history of CAD/MI, heart failure or a thyroid problem. There is no history of sleep apnea.  Hyperlipidemia This is a chronic problem. The current episode started more than 1 year ago. The problem is controlled. Recent lipid tests were reviewed and are normal. She has no history of diabetes or hypothyroidism. Factors aggravating her hyperlipidemia include fatty foods. Pertinent negatives include no leg pain, myalgias or shortness of breath. Current antihyperlipidemic treatment includes statins. The current treatment provides significant (Pt told she could stop her lovastatin) improvement of lipids. Risk factors for coronary artery disease include dyslipidemia, hypertension, post-menopausal and a sedentary lifestyle.  Arthritis Presents for follow-up visit. The disease course has been fluctuating. She reports no pain or joint swelling. Affected locations include the right MCP, left MCP, left knee and left wrist. Her pain is at a severity of 6/10. Pertinent negatives include no dry eyes, fever or rash. Her past medical history is significant for osteoarthritis. Her pertinent risk factors include overuse. Past treatments include NSAIDs and rest.  The treatment provided moderate relief. Factors aggravating her arthritis include activity.  Gout Pt currently taking allopurinol 100 mg daily. PT states she is doing well this and can not remember her last attack.     Review of Systems  Constitutional: Negative.  Negative for fever.  HENT: Negative.   Eyes: Negative.   Respiratory: Negative.  Negative for shortness of breath.   Cardiovascular: Negative.  Negative for palpitations.  Gastrointestinal: Negative.   Endocrine: Negative.   Genitourinary: Negative.   Musculoskeletal: Positive for arthritis. Negative for myalgias and joint swelling.  Skin: Negative for rash.  Neurological: Negative.  Negative for headaches.  Hematological: Negative.   Psychiatric/Behavioral: Negative.   All other systems reviewed and are negative.      Objective:   Physical Exam  Constitutional: She is oriented to person, place, and time. She appears well-developed and well-nourished. No distress.  HENT:  Head: Normocephalic and atraumatic.  Right Ear: External ear normal.  Left Ear: External ear normal.  Nose: Nose normal.  Mouth/Throat: Oropharynx is clear and moist.  Eyes: Pupils are equal, round, and reactive to light.  Neck: Normal range of motion. Neck supple. No thyromegaly present.  Cardiovascular: Normal rate, regular rhythm, normal heart sounds and intact distal pulses.   No murmur heard. Pulmonary/Chest: Effort normal and breath sounds normal. No respiratory distress. She has no wheezes.  Abdominal: Soft. Bowel sounds are normal. She exhibits no distension. There is no tenderness.  Musculoskeletal: Normal range of motion. She exhibits edema (trace in lower ankles ). She exhibits no tenderness.  Neurological: She is alert and oriented to person, place, and time. She has normal reflexes. No cranial nerve deficit.  Skin: Skin is warm and dry.  Psychiatric: She has a normal mood and affect. Her behavior  is normal. Judgment and thought  content normal.  Vitals reviewed.     BP 153/76 mmHg  Pulse 67  Temp(Src) 97.2 F (36.2 C) (Oral)  Ht _0  (1.448 m)  Wt 142 lb 9.6 oz (64.683 kg)  BMI 30.85 kg/m2     Assessment & Plan:  1. Essential hypertension - CMP14+EGFR  2. Osteoporosis - CMP14+EGFR  3. Hemorrhoids, unspecified hemorrhoid type - CMP14+EGFR  4. Hyperlipidemia -Lipid pending- Pt told she could stop the lovastatin -Pt states she will start taking Lovastatin every other night at this time - CMP14+EGFR - Lipid panel  5. Gout, unspecified cause, unspecified chronicity, unspecified site -Uric acid pending- If WNL will stop allopurinol  - CMP14+EGFR - Uric acid  6. Single kidney - CMP14+EGFR  7. Primary osteoarthritis of both knees - CMP14+EGFR  8. Arthritis of knee, left - CMP14+EGFR   Continue all meds Labs pending Health Maintenance reviewed Diet and exercise encouraged RTO 4 months  Evelina Dun, FNP

## 2016-01-11 LAB — CMP14+EGFR
A/G RATIO: 1.5 (ref 1.1–2.5)
ALBUMIN: 4.1 g/dL (ref 3.2–4.6)
ALT: 15 IU/L (ref 0–32)
AST: 23 IU/L (ref 0–40)
Alkaline Phosphatase: 59 IU/L (ref 39–117)
BUN / CREAT RATIO: 30 — AB (ref 11–26)
BUN: 31 mg/dL (ref 10–36)
Bilirubin Total: 0.7 mg/dL (ref 0.0–1.2)
CALCIUM: 9.5 mg/dL (ref 8.7–10.3)
CO2: 28 mmol/L (ref 18–29)
CREATININE: 1.05 mg/dL — AB (ref 0.57–1.00)
Chloride: 100 mmol/L (ref 96–106)
GFR, EST AFRICAN AMERICAN: 53 mL/min/{1.73_m2} — AB (ref >59)
GFR, EST NON AFRICAN AMERICAN: 46 mL/min/{1.73_m2} — AB (ref >59)
GLOBULIN, TOTAL: 2.7 g/dL (ref 1.5–4.5)
Glucose: 94 mg/dL (ref 65–99)
POTASSIUM: 4.1 mmol/L (ref 3.5–5.2)
SODIUM: 142 mmol/L (ref 134–144)
Total Protein: 6.8 g/dL (ref 6.0–8.5)

## 2016-01-11 LAB — LIPID PANEL
CHOL/HDL RATIO: 2.6 ratio (ref 0.0–4.4)
Cholesterol, Total: 125 mg/dL (ref 100–199)
HDL: 48 mg/dL (ref >39)
LDL CALC: 65 mg/dL (ref 0–99)
TRIGLYCERIDES: 58 mg/dL (ref 0–149)
VLDL Cholesterol Cal: 12 mg/dL (ref 5–40)

## 2016-01-11 LAB — URIC ACID: URIC ACID: 5 mg/dL (ref 2.5–7.1)

## 2016-02-07 DIAGNOSIS — M5030 Other cervical disc degeneration, unspecified cervical region: Secondary | ICD-10-CM | POA: Diagnosis not present

## 2016-02-07 DIAGNOSIS — M9901 Segmental and somatic dysfunction of cervical region: Secondary | ICD-10-CM | POA: Diagnosis not present

## 2016-02-08 DIAGNOSIS — I973 Postprocedural hypertension: Secondary | ICD-10-CM | POA: Diagnosis not present

## 2016-02-08 DIAGNOSIS — M9901 Segmental and somatic dysfunction of cervical region: Secondary | ICD-10-CM | POA: Diagnosis not present

## 2016-02-08 DIAGNOSIS — M5030 Other cervical disc degeneration, unspecified cervical region: Secondary | ICD-10-CM | POA: Diagnosis not present

## 2016-02-14 DIAGNOSIS — M5030 Other cervical disc degeneration, unspecified cervical region: Secondary | ICD-10-CM | POA: Diagnosis not present

## 2016-02-14 DIAGNOSIS — M9901 Segmental and somatic dysfunction of cervical region: Secondary | ICD-10-CM | POA: Diagnosis not present

## 2016-02-21 DIAGNOSIS — M5030 Other cervical disc degeneration, unspecified cervical region: Secondary | ICD-10-CM | POA: Diagnosis not present

## 2016-02-21 DIAGNOSIS — M9901 Segmental and somatic dysfunction of cervical region: Secondary | ICD-10-CM | POA: Diagnosis not present

## 2016-02-24 DIAGNOSIS — I973 Postprocedural hypertension: Secondary | ICD-10-CM | POA: Diagnosis not present

## 2016-02-24 DIAGNOSIS — M5030 Other cervical disc degeneration, unspecified cervical region: Secondary | ICD-10-CM | POA: Diagnosis not present

## 2016-02-24 DIAGNOSIS — M9901 Segmental and somatic dysfunction of cervical region: Secondary | ICD-10-CM | POA: Diagnosis not present

## 2016-03-01 DIAGNOSIS — M9901 Segmental and somatic dysfunction of cervical region: Secondary | ICD-10-CM | POA: Diagnosis not present

## 2016-03-01 DIAGNOSIS — M5030 Other cervical disc degeneration, unspecified cervical region: Secondary | ICD-10-CM | POA: Diagnosis not present

## 2016-03-16 DIAGNOSIS — M5030 Other cervical disc degeneration, unspecified cervical region: Secondary | ICD-10-CM | POA: Diagnosis not present

## 2016-03-16 DIAGNOSIS — I973 Postprocedural hypertension: Secondary | ICD-10-CM | POA: Diagnosis not present

## 2016-03-16 DIAGNOSIS — M9901 Segmental and somatic dysfunction of cervical region: Secondary | ICD-10-CM | POA: Diagnosis not present

## 2016-04-12 DIAGNOSIS — M5030 Other cervical disc degeneration, unspecified cervical region: Secondary | ICD-10-CM | POA: Diagnosis not present

## 2016-04-12 DIAGNOSIS — I973 Postprocedural hypertension: Secondary | ICD-10-CM | POA: Diagnosis not present

## 2016-04-12 DIAGNOSIS — M9901 Segmental and somatic dysfunction of cervical region: Secondary | ICD-10-CM | POA: Diagnosis not present

## 2016-04-26 DIAGNOSIS — M5030 Other cervical disc degeneration, unspecified cervical region: Secondary | ICD-10-CM | POA: Diagnosis not present

## 2016-04-26 DIAGNOSIS — M9904 Segmental and somatic dysfunction of sacral region: Secondary | ICD-10-CM | POA: Diagnosis not present

## 2016-04-26 DIAGNOSIS — M9902 Segmental and somatic dysfunction of thoracic region: Secondary | ICD-10-CM | POA: Diagnosis not present

## 2016-04-26 DIAGNOSIS — M9903 Segmental and somatic dysfunction of lumbar region: Secondary | ICD-10-CM | POA: Diagnosis not present

## 2016-04-26 DIAGNOSIS — M9901 Segmental and somatic dysfunction of cervical region: Secondary | ICD-10-CM | POA: Diagnosis not present

## 2016-05-10 DIAGNOSIS — M9903 Segmental and somatic dysfunction of lumbar region: Secondary | ICD-10-CM | POA: Diagnosis not present

## 2016-05-10 DIAGNOSIS — M9901 Segmental and somatic dysfunction of cervical region: Secondary | ICD-10-CM | POA: Diagnosis not present

## 2016-05-10 DIAGNOSIS — M9904 Segmental and somatic dysfunction of sacral region: Secondary | ICD-10-CM | POA: Diagnosis not present

## 2016-05-10 DIAGNOSIS — M9902 Segmental and somatic dysfunction of thoracic region: Secondary | ICD-10-CM | POA: Diagnosis not present

## 2016-05-10 DIAGNOSIS — M5030 Other cervical disc degeneration, unspecified cervical region: Secondary | ICD-10-CM | POA: Diagnosis not present

## 2016-05-11 ENCOUNTER — Ambulatory Visit (INDEPENDENT_AMBULATORY_CARE_PROVIDER_SITE_OTHER): Payer: Medicare Other | Admitting: Family

## 2016-05-11 ENCOUNTER — Encounter: Payer: Self-pay | Admitting: Family

## 2016-05-11 VITALS — BP 156/75 | HR 65 | Temp 97.4°F | Ht <= 58 in | Wt 141.8 lb

## 2016-05-11 DIAGNOSIS — Z905 Acquired absence of kidney: Secondary | ICD-10-CM | POA: Diagnosis not present

## 2016-05-11 DIAGNOSIS — M170 Bilateral primary osteoarthritis of knee: Secondary | ICD-10-CM

## 2016-05-11 DIAGNOSIS — M810 Age-related osteoporosis without current pathological fracture: Secondary | ICD-10-CM

## 2016-05-11 DIAGNOSIS — M109 Gout, unspecified: Secondary | ICD-10-CM

## 2016-05-11 DIAGNOSIS — E785 Hyperlipidemia, unspecified: Secondary | ICD-10-CM

## 2016-05-11 DIAGNOSIS — M129 Arthropathy, unspecified: Secondary | ICD-10-CM

## 2016-05-11 DIAGNOSIS — M1712 Unilateral primary osteoarthritis, left knee: Secondary | ICD-10-CM

## 2016-05-11 DIAGNOSIS — I10 Essential (primary) hypertension: Secondary | ICD-10-CM

## 2016-05-11 DIAGNOSIS — E8881 Metabolic syndrome: Secondary | ICD-10-CM

## 2016-05-11 LAB — CMP14+EGFR
ALBUMIN: 4 g/dL (ref 3.2–4.6)
ALT: 12 IU/L (ref 0–32)
AST: 23 IU/L (ref 0–40)
Albumin/Globulin Ratio: 1.5 (ref 1.2–2.2)
Alkaline Phosphatase: 61 IU/L (ref 39–117)
BUN / CREAT RATIO: 22 (ref 12–28)
BUN: 24 mg/dL (ref 10–36)
Bilirubin Total: 0.6 mg/dL (ref 0.0–1.2)
CO2: 27 mmol/L (ref 18–29)
CREATININE: 1.07 mg/dL — AB (ref 0.57–1.00)
Calcium: 9.2 mg/dL (ref 8.7–10.3)
Chloride: 98 mmol/L (ref 96–106)
GFR, EST AFRICAN AMERICAN: 52 mL/min/{1.73_m2} — AB (ref >59)
GFR, EST NON AFRICAN AMERICAN: 45 mL/min/{1.73_m2} — AB (ref >59)
GLOBULIN, TOTAL: 2.7 g/dL (ref 1.5–4.5)
Glucose: 88 mg/dL (ref 65–99)
Potassium: 4.1 mmol/L (ref 3.5–5.2)
SODIUM: 142 mmol/L (ref 134–144)
TOTAL PROTEIN: 6.7 g/dL (ref 6.0–8.5)

## 2016-05-11 MED ORDER — CARVEDILOL 6.25 MG PO TABS: 6.2500 mg | ORAL_TABLET | Freq: Two times a day (BID) | ORAL | Status: DC

## 2016-05-11 MED ORDER — MELOXICAM 15 MG PO TABS: 15.0000 mg | ORAL_TABLET | Freq: Every day | ORAL | Status: DC

## 2016-05-11 NOTE — Patient Instructions (Signed)
Health Maintenance, Female Adopting a healthy lifestyle and getting preventive care can go a long way to promote health and wellness. Talk with your health care provider about what schedule of regular examinations is right for you. This is a good chance for you to check in with your provider about disease prevention and staying healthy. In between checkups, there are plenty of things you can do on your own. Experts have done a lot of research about which lifestyle changes and preventive measures are most likely to keep you healthy. Ask your health care provider for more information. WEIGHT AND DIET  Eat a healthy diet  Be sure to include plenty of vegetables, fruits, low-fat dairy products, and lean protein.  Do not eat a lot of foods high in solid fats, added sugars, or salt.  Get regular exercise. This is one of the most important things you can do for your health.  Most adults should exercise for at least 150 minutes each week. The exercise should increase your heart rate and make you sweat (moderate-intensity exercise).  Most adults should also do strengthening exercises at least twice a week. This is in addition to the moderate-intensity exercise.  Maintain a healthy weight  Body mass index (BMI) is a measurement that can be used to identify possible weight problems. It estimates body fat based on height and weight. Your health care provider can help determine your BMI and help you achieve or maintain a healthy weight.  For females 20 years of age and older:   A BMI below 18.5 is considered underweight.  A BMI of 18.5 to 24.9 is normal.  A BMI of 25 to 29.9 is considered overweight.  A BMI of 30 and above is considered obese.  Watch levels of cholesterol and blood lipids  You should start having your blood tested for lipids and cholesterol at 80 years of age, then have this test every 5 years.  You may need to have your cholesterol levels checked more often if:  Your lipid  or cholesterol levels are high.  You are older than 80 years of age.  You are at high risk for heart disease.  CANCER SCREENING   Lung Cancer  Lung cancer screening is recommended for adults 55-80 years old who are at high risk for lung cancer because of a history of smoking.  A yearly low-dose CT scan of the lungs is recommended for people who:  Currently smoke.  Have quit within the past 15 years.  Have at least a 30-pack-year history of smoking. A pack year is smoking an average of one pack of cigarettes a day for 1 year.  Yearly screening should continue until it has been 15 years since you quit.  Yearly screening should stop if you develop a health problem that would prevent you from having lung cancer treatment.  Breast Cancer  Practice breast self-awareness. This means understanding how your breasts normally appear and feel.  It also means doing regular breast self-exams. Let your health care provider know about any changes, no matter how small.  If you are in your 20s or 30s, you should have a clinical breast exam (CBE) by a health care provider every 1-3 years as part of a regular health exam.  If you are 40 or older, have a CBE every year. Also consider having a breast X-ray (mammogram) every year.  If you have a family history of breast cancer, talk to your health care provider about genetic screening.  If you   are at high risk for breast cancer, talk to your health care provider about having an MRI and a mammogram every year.  Breast cancer gene (BRCA) assessment is recommended for women who have family members with BRCA-related cancers. BRCA-related cancers include:  Breast.  Ovarian.  Tubal.  Peritoneal cancers.  Results of the assessment will determine the need for genetic counseling and BRCA1 and BRCA2 testing. Cervical Cancer Your health care provider may recommend that you be screened regularly for cancer of the pelvic organs (ovaries, uterus, and  vagina). This screening involves a pelvic examination, including checking for microscopic changes to the surface of your cervix (Pap test). You may be encouraged to have this screening done every 3 years, beginning at age 21.  For women ages 30-65, health care providers may recommend pelvic exams and Pap testing every 3 years, or they may recommend the Pap and pelvic exam, combined with testing for human papilloma virus (HPV), every 5 years. Some types of HPV increase your risk of cervical cancer. Testing for HPV may also be done on women of any age with unclear Pap test results.  Other health care providers may not recommend any screening for nonpregnant women who are considered low risk for pelvic cancer and who do not have symptoms. Ask your health care provider if a screening pelvic exam is right for you.  If you have had past treatment for cervical cancer or a condition that could lead to cancer, you need Pap tests and screening for cancer for at least 20 years after your treatment. If Pap tests have been discontinued, your risk factors (such as having a new sexual partner) need to be reassessed to determine if screening should resume. Some women have medical problems that increase the chance of getting cervical cancer. In these cases, your health care provider may recommend more frequent screening and Pap tests. Colorectal Cancer  This type of cancer can be detected and often prevented.  Routine colorectal cancer screening usually begins at 80 years of age and continues through 80 years of age.  Your health care provider may recommend screening at an earlier age if you have risk factors for colon cancer.  Your health care provider may also recommend using home test kits to check for hidden blood in the stool.  A small camera at the end of a tube can be used to examine your colon directly (sigmoidoscopy or colonoscopy). This is done to check for the earliest forms of colorectal  cancer.  Routine screening usually begins at age 50.  Direct examination of the colon should be repeated every 5-10 years through 80 years of age. However, you may need to be screened more often if early forms of precancerous polyps or small growths are found. Skin Cancer  Check your skin from head to toe regularly.  Tell your health care provider about any new moles or changes in moles, especially if there is a change in a mole's shape or color.  Also tell your health care provider if you have a mole that is larger than the size of a pencil eraser.  Always use sunscreen. Apply sunscreen liberally and repeatedly throughout the day.  Protect yourself by wearing long sleeves, pants, a wide-brimmed hat, and sunglasses whenever you are outside. HEART DISEASE, DIABETES, AND HIGH BLOOD PRESSURE   High blood pressure causes heart disease and increases the risk of stroke. High blood pressure is more likely to develop in:  People who have blood pressure in the high end   of the normal range (130-139/85-89 mm Hg).  People who are overweight or obese.  People who are African American.  If you are 38-23 years of age, have your blood pressure checked every 3-5 years. If you are 61 years of age or older, have your blood pressure checked every year. You should have your blood pressure measured twice--once when you are at a hospital or clinic, and once when you are not at a hospital or clinic. Record the average of the two measurements. To check your blood pressure when you are not at a hospital or clinic, you can use:  An automated blood pressure machine at a pharmacy.  A home blood pressure monitor.  If you are between 45 years and 39 years old, ask your health care provider if you should take aspirin to prevent strokes.  Have regular diabetes screenings. This involves taking a blood sample to check your fasting blood sugar level.  If you are at a normal weight and have a low risk for diabetes,  have this test once every three years after 80 years of age.  If you are overweight and have a high risk for diabetes, consider being tested at a younger age or more often. PREVENTING INFECTION  Hepatitis B  If you have a higher risk for hepatitis B, you should be screened for this virus. You are considered at high risk for hepatitis B if:  You were born in a country where hepatitis B is common. Ask your health care provider which countries are considered high risk.  Your parents were born in a high-risk country, and you have not been immunized against hepatitis B (hepatitis B vaccine).  You have HIV or AIDS.  You use needles to inject street drugs.  You live with someone who has hepatitis B.  You have had sex with someone who has hepatitis B.  You get hemodialysis treatment.  You take certain medicines for conditions, including cancer, organ transplantation, and autoimmune conditions. Hepatitis C  Blood testing is recommended for:  Everyone born from [birth year] through 1963.  Anyone with known risk factors for hepatitis C. Sexually transmitted infections (STIs)  You should be screened for sexually transmitted infections (STIs) including gonorrhea and chlamydia if:  You are sexually active and are younger than 80 years of age.  You are older than 80 years of age and your health care provider tells you that you are at risk for this type of infection.  Your sexual activity has changed since you were last screened and you are at an increased risk for chlamydia or gonorrhea. Ask your health care provider if you are at risk.  If you do not have HIV, but are at risk, it may be recommended that you take a prescription medicine daily to prevent HIV infection. This is called pre-exposure prophylaxis (PrEP). You are considered at risk if:  You are sexually active and do not regularly use condoms or know the HIV status of your partner(s).  You take drugs by injection.  You are sexually  active with a partner who has HIV. Talk with your health care provider about whether you are at high risk of being infected with HIV. If you choose to begin PrEP, you should first be tested for HIV. You should then be tested every 3 months for as long as you are taking PrEP.  PREGNANCY   If you are premenopausal and you may become pregnant, ask your health care provider about preconception counseling.  If you may  become pregnant, take 400 to 800 micrograms (mcg) of folic acid every day.  If you want to prevent pregnancy, talk to your health care provider about birth control (contraception). OSTEOPOROSIS AND MENOPAUSE   Osteoporosis is a disease in which the bones lose minerals and strength with aging. This can result in serious bone fractures. Your risk for osteoporosis can be identified using a bone density scan.  If you are 61 years of age or older, or if you are at risk for osteoporosis and fractures, ask your health care provider if you should be screened.  Ask your health care provider whether you should take a calcium or vitamin D supplement to lower your risk for osteoporosis.  Menopause may have certain physical symptoms and risks.  Hormone replacement therapy may reduce some of these symptoms and risks. Talk to your health care provider about whether hormone replacement therapy is right for you.  HOME CARE INSTRUCTIONS   Schedule regular health, dental, and eye exams.  Stay current with your immunizations.   Do not use any tobacco products including cigarettes, chewing tobacco, or electronic cigarettes.  If you are pregnant, do not drink alcohol.  If you are breastfeeding, limit how much and how often you drink alcohol.  Limit alcohol intake to no more than 1 drink per day for nonpregnant women. One drink equals 12 ounces of beer, 5 ounces of wine, or 1 ounces of hard liquor.  Do not use street drugs.  Do not share needles.  Ask your health care provider for help if  you need support or information about quitting drugs.  Tell your health care provider if you often feel depressed.  Tell your health care provider if you have ever been abused or do not feel safe at home.   This information is not intended to replace advice given to you by your health care provider. Make sure you discuss any questions you have with your health care provider.   Document Released: 05/22/2011 Document Revised: 11/27/2014 Document Reviewed: 10/08/2013 Elsevier Interactive Patient Education [employer].

## 2016-05-11 NOTE — Progress Notes (Signed)
Subjective:    Patient ID: Paige Ferguson, female    DOB: Jun 21, 1921, 82 y.o.   MRN: [id number]  Pt presents to the office today for chronic follow up.   Hypertension This is a chronic problem. The current episode started more than 1 year ago. The problem has been waxing and waning since onset. The problem is uncontrolled. Associated symptoms include peripheral edema ("a little"). Pertinent negatives include no anxiety, headaches, palpitations or shortness of breath. Risk factors for coronary artery disease include dyslipidemia and post-menopausal state. Past treatments include beta blockers, ACE inhibitors, diuretics and calcium channel blockers. The current treatment provides mild improvement. Hypertensive end-organ damage includes kidney disease and CVA. There is no history of CAD/MI, heart failure or a thyroid problem. There is no history of sleep apnea.  Hyperlipidemia This is a chronic problem. The current episode started more than 1 year ago. The problem is controlled. Recent lipid tests were reviewed and are normal. She has no history of diabetes or hypothyroidism. Factors aggravating her hyperlipidemia include fatty foods. Pertinent negatives include no leg pain, myalgias or shortness of breath. Current antihyperlipidemic treatment includes statins and diet change. The current treatment provides significant (Pt told she could stop her lovastatin) improvement of lipids. Risk factors for coronary artery disease include dyslipidemia, hypertension, post-menopausal and a sedentary lifestyle.  Arthritis Presents for follow-up visit. The disease course has been fluctuating. She reports no pain or joint swelling. Affected locations include the right MCP, left MCP, left knee and left wrist. Her pain is at a severity of 6/10. Pertinent negatives include no dry eyes, fever or rash. Her past medical history is significant for osteoarthritis. Her pertinent risk factors include overuse. Past treatments include  NSAIDs and rest. The treatment provided moderate relief. Factors aggravating her arthritis include activity.  Gout Pt stopped her allopurinol 100 mg daily on our last visit and states she is doing well. PT states she is doing well this and can not remember her last attack. Osteoporosis  PT currently taking Evista 60 mg daily, calcium 1200 mg and D3 100 mg daily. Pt's last Dexa scan 12/02/2014.   Review of Systems  Constitutional: Negative.  Negative for fever.  HENT: Negative.   Eyes: Negative.   Respiratory: Negative.  Negative for shortness of breath.   Cardiovascular: Negative.  Negative for palpitations.  Gastrointestinal: Negative.   Endocrine: Negative.   Genitourinary: Negative.   Musculoskeletal: Positive for arthritis. Negative for myalgias and joint swelling.  Skin: Negative for rash.  Neurological: Negative.  Negative for headaches.  Hematological: Negative.   Psychiatric/Behavioral: Negative.   All other systems reviewed and are negative.      Objective:   Physical Exam  Constitutional: She is oriented to person, place, and time. She appears well-developed and well-nourished. No distress.  HENT:  Head: Normocephalic and atraumatic.  Right Ear: External ear normal.  Left Ear: External ear normal.  Nose: Nose normal.  Mouth/Throat: Oropharynx is clear and moist.  Eyes: Pupils are equal, round, and reactive to light.  Neck: Normal range of motion. Neck supple. No thyromegaly present.  Cardiovascular: Normal rate, regular rhythm, normal heart sounds and intact distal pulses.   No murmur heard. Pulmonary/Chest: Effort normal and breath sounds normal. No respiratory distress. She has no wheezes.  Abdominal: Soft. Bowel sounds are normal. She exhibits no distension. There is no tenderness.  Musculoskeletal: Normal range of motion. She exhibits edema (2+in BLE). She exhibits no tenderness.  Neurological: She is alert and oriented to  person, place, and time. She has normal  reflexes. No cranial nerve deficit.  Skin: Skin is warm and dry.  Psychiatric: She has a normal mood and affect. Her behavior is normal. Judgment and thought content normal.  Vitals reviewed.     BP 156/75 mmHg  Pulse 65  Temp(Src) 97.4 F (36.3 C) (Oral)  Ht _0  (1.448 m)  Wt 141 lb 12.8 oz (64.32 kg)  BMI 30.68 kg/m2     Assessment & Plan:  1. Arthritis of knee, left - meloxicam (MOBIC) 15 MG tablet; Take 1 tablet (15 mg total) by mouth daily.  Dispense: 90 tablet; Refill: 1 - CMP14+EGFR  2. Essential hypertension - carvedilol (COREG) 6.25 MG tablet; Take 1 tablet (6.25 mg total) by mouth 2 (two) times daily with a meal.  Dispense: 180 tablet; Refill: 1 - CMP14+EGFR  3. Osteoporosis - CMP14+EGFR  4. Primary osteoarthritis of both knees - CMP14+EGFR  5. Single kidney - CMP14+EGFR  6. Gout, unspecified cause, unspecified chronicity, unspecified site - CMP14+EGFR  7. Hyperlipidemia - VWP79+YIAX  8. Metabolic syndrome - KPV37+SMOL   Continue all meds Labs pending Health Maintenance reviewed Diet and exercise encouraged RTO 4 months  Evelina Dun, FNP

## 2016-05-24 DIAGNOSIS — M9903 Segmental and somatic dysfunction of lumbar region: Secondary | ICD-10-CM | POA: Diagnosis not present

## 2016-05-24 DIAGNOSIS — M9902 Segmental and somatic dysfunction of thoracic region: Secondary | ICD-10-CM | POA: Diagnosis not present

## 2016-05-24 DIAGNOSIS — M5030 Other cervical disc degeneration, unspecified cervical region: Secondary | ICD-10-CM | POA: Diagnosis not present

## 2016-05-24 DIAGNOSIS — M9904 Segmental and somatic dysfunction of sacral region: Secondary | ICD-10-CM | POA: Diagnosis not present

## 2016-05-24 DIAGNOSIS — M9901 Segmental and somatic dysfunction of cervical region: Secondary | ICD-10-CM | POA: Diagnosis not present

## 2016-06-07 DIAGNOSIS — M9903 Segmental and somatic dysfunction of lumbar region: Secondary | ICD-10-CM | POA: Diagnosis not present

## 2016-06-07 DIAGNOSIS — M5030 Other cervical disc degeneration, unspecified cervical region: Secondary | ICD-10-CM | POA: Diagnosis not present

## 2016-06-07 DIAGNOSIS — M9901 Segmental and somatic dysfunction of cervical region: Secondary | ICD-10-CM | POA: Diagnosis not present

## 2016-06-07 DIAGNOSIS — M9902 Segmental and somatic dysfunction of thoracic region: Secondary | ICD-10-CM | POA: Diagnosis not present

## 2016-06-07 DIAGNOSIS — M9904 Segmental and somatic dysfunction of sacral region: Secondary | ICD-10-CM | POA: Diagnosis not present

## 2016-06-23 DIAGNOSIS — H43393 Other vitreous opacities, bilateral: Secondary | ICD-10-CM | POA: Diagnosis not present

## 2016-06-23 DIAGNOSIS — H04123 Dry eye syndrome of bilateral lacrimal glands: Secondary | ICD-10-CM | POA: Diagnosis not present

## 2016-06-23 DIAGNOSIS — H43813 Vitreous degeneration, bilateral: Secondary | ICD-10-CM | POA: Diagnosis not present

## 2016-06-23 DIAGNOSIS — Z961 Presence of intraocular lens: Secondary | ICD-10-CM | POA: Diagnosis not present

## 2016-07-05 DIAGNOSIS — M9901 Segmental and somatic dysfunction of cervical region: Secondary | ICD-10-CM | POA: Diagnosis not present

## 2016-07-05 DIAGNOSIS — M9903 Segmental and somatic dysfunction of lumbar region: Secondary | ICD-10-CM | POA: Diagnosis not present

## 2016-07-05 DIAGNOSIS — M5030 Other cervical disc degeneration, unspecified cervical region: Secondary | ICD-10-CM | POA: Diagnosis not present

## 2016-07-05 DIAGNOSIS — M9904 Segmental and somatic dysfunction of sacral region: Secondary | ICD-10-CM | POA: Diagnosis not present

## 2016-07-05 DIAGNOSIS — M9902 Segmental and somatic dysfunction of thoracic region: Secondary | ICD-10-CM | POA: Diagnosis not present

## 2016-07-26 ENCOUNTER — Encounter: Payer: Self-pay | Admitting: Family

## 2016-08-02 DIAGNOSIS — M9902 Segmental and somatic dysfunction of thoracic region: Secondary | ICD-10-CM | POA: Diagnosis not present

## 2016-08-02 DIAGNOSIS — M9903 Segmental and somatic dysfunction of lumbar region: Secondary | ICD-10-CM | POA: Diagnosis not present

## 2016-08-02 DIAGNOSIS — M9901 Segmental and somatic dysfunction of cervical region: Secondary | ICD-10-CM | POA: Diagnosis not present

## 2016-08-02 DIAGNOSIS — M5030 Other cervical disc degeneration, unspecified cervical region: Secondary | ICD-10-CM | POA: Diagnosis not present

## 2016-08-02 DIAGNOSIS — M9904 Segmental and somatic dysfunction of sacral region: Secondary | ICD-10-CM | POA: Diagnosis not present

## 2016-08-15 ENCOUNTER — Encounter: Payer: Self-pay | Admitting: Pharmacist

## 2016-08-15 ENCOUNTER — Ambulatory Visit (INDEPENDENT_AMBULATORY_CARE_PROVIDER_SITE_OTHER): Payer: Medicare Other | Admitting: Pharmacist

## 2016-08-15 VITALS — BP 146/72 | HR 74 | Ht <= 58 in | Wt 135.0 lb

## 2016-08-15 DIAGNOSIS — Z0000 Encounter for general adult medical examination without abnormal findings: Secondary | ICD-10-CM | POA: Diagnosis not present

## 2016-08-15 DIAGNOSIS — Z23 Encounter for immunization: Secondary | ICD-10-CM | POA: Diagnosis not present

## 2016-08-15 NOTE — Progress Notes (Signed)
Patient ID: Paige Ferguson, female   DOB: Jun 01, 1921, 82 y.o.   MRN: [id number]     Subjective:   Paige Ferguson is a 82 y.o. female who presents for an subsequent Medicare Annual Wellness Visit.  Ms. Klammer is widow for 5 years.  She does not live alone - her adult grandson lives with her.  He helps with cooking and cleaning.  She continues to attend church regularly and sees family and friends regularly.   She reports that her only current health concern is that she is having shoulder and neck pain but she is seeing Dr Andres Labrum / chiropractor for the last few weeks and pain is improving.    Current Medications (verified) Outpatient Encounter Prescriptions as of 08/15/2016  Medication Sig  . amLODipine (NORVASC) 5 MG tablet Take 1 tablet (5 mg total) by mouth daily.  . benazepril (LOTENSIN) 40 MG tablet Take 1 tablet (40 mg total) by mouth daily.  . carvedilol (COREG) 6.25 MG tablet Take 1 tablet (6.25 mg total) by mouth 2 (two) times daily with a meal.  . fexofenadine (ALLEGRA) 30 MG tablet Take 1 tablet (30 mg total) by mouth daily. Over the counter  . furosemide (LASIX) 40 MG tablet Taken one and half tablet by [employer] daily  . meloxicam (MOBIC) 15 MG tablet Take 1 tablet (15 mg total) by mouth daily.  . Misc Natural Products (GNP GLUCOSAME CHONDROITIN DS) TABS Take by mouth.  . Misc. Devices (STEP N REST II WALKER) MISC by Does not apply route daily. PT HAS RX. FOR ROLLING WALKER WITH SEAT AND HANDBRAKES .   . Multiple Vitamins-Minerals (CENTRUM SILVER ADULT 50+) TABS Take 1 tablet by mouth daily.  . Omega-3 Fatty Acids (FISH OIL) 1000 MG CAPS Take 1 capsule by mouth 2 (two) times daily.  . raloxifene (EVISTA) 60 MG tablet TAKE 1 TABLET DAILY  . vitamin E 200 UNIT capsule Take 200 Units by mouth daily.   No facility-administered encounter medications on file as of 08/15/2016.     Allergies (verified) Sulfa antibiotics; Amoxicillin; and Nitrofurantoin monohyd macro   History: Past  Medical History:  Diagnosis Date  . Allergy   . Arthritis of knee   . Cataract   . Cholelithiases   . Chronic kidney disease   . Endometrial polyp 7/98   2 degree polyps   . Genu valgum   . Gout   . Hyperlipidemia   . Hypertension   . Left knee pain   . Osteoarthritis   . Osteoporosis   . Postmenopausal bleeding   . Recurrent UTI   . Renal abscess, right   . Shoulder fracture, left   . TIA (transient ischemic attack)   . Ureteral calculi   . URI (upper respiratory infection)    Past Surgical History:  Procedure Laterality Date  . EYE SURGERY    . HERNIA REPAIR    . HYSTEROSCOPY , FRACTIONAL D&C  7/98/  . [place]  . LEFT CATARACT SURGERY  10/99  . RIGHT CATARACT SURGERY  7/99   Dr. Dolores Lory   . RIGHT HEMINEPHRECTOMY FOR ABSCESS  2/98  . RIGHT NEPHRELITHOTOMY  1982   Family History  Problem Relation Age of Onset  . Stroke Maternal Grandmother   . Hypertension Maternal Grandmother   . Diabetes Maternal Grandmother   . Pneumonia Father   . Coronary artery disease Paternal Grandfather    Social History   Occupational History  . Not on file.  Social History Main Topics  . Smoking status: Former Smoker    Types: Cigarettes    Quit date: 11/20/1944  . Smokeless tobacco: Never Used  . Alcohol use No  . Drug use: No  . Sexual activity: No    Do you feel safe at home?  Yes Are there smokers in your home (other than you)? No  Dietary issues and exercise activities: Current Exercise Habits: Home exercise routine, Type of exercise: stretching (stretching and chair exercises), Time (Minutes): 10, Frequency (Times/Week): 3, Weekly Exercise (Minutes/Week): 30, Intensity: Mild  Current Dietary habits:  Not following any specific dietary recommendations at this time.   Objective:    Today's Vitals   08/15/16 1441  BP: (!) 170/80  Pulse: 74  Weight: 135 lb (61.2 kg)  Height: 4\' 9"  (1.448 m)  PainSc: 2   PainLoc: Neck   Body mass index is  29.21 kg/m.  Activities of Daily Living In your present state of health, do you have any difficulty performing the following activities: 08/15/2016  Hearing? N  Vision? N  Difficulty concentrating or making decisions? N  Walking or climbing stairs? Y - uses rolling walker for ambulatory assitance  Dressing or bathing? N  Doing errands, shopping? Y - patient no longer Restaurant manager, fast food and eating ? Y - patient cooks a few things from time to time but her grandson and family does most of cooking  Using the Toilet? N  In the past six months, have you accidently leaked urine? N  Do you have problems with loss of bowel control? N  Managing your Medications? N  Managing your Finances? N  Housekeeping or managing your Housekeeping? Y - grandson   Some recent data might be hidden     Cardiac Risk Factors include: advanced age (>24men, >4 women);dyslipidemia;hypertension;obesity (BMI >30kg/m2);sedentary lifestyle  Depression Screen PHQ 2/9 Scores 08/15/2016 05/11/2016 01/10/2016 09/09/2015  PHQ - 2 Score 0 0 0 0     Fall Risk Fall Risk  08/15/2016 05/11/2016 01/10/2016 09/09/2015 05/28/2015  Falls in the past year? No No No No No  Number falls in past yr: - - - - -  Injury with Fall? - - - - -  Risk for fall due to : - - - - Impaired balance/gait    Cognitive Function: MMSE - Mini Mental State Exam 08/15/2016 08/15/2016 05/28/2015  Orientation to time 5 5 5   Orientation to Place 5 5 5   Registration 3 3 3   Attention/ Calculation 2 2 4   Recall 3 3 3   Language- name 2 objects 2 2 2   Language- repeat 1 1 1   Language- follow 3 step command 3 3 3   Language- read & follow direction 1 1 1   Write a sentence 1 - 1  Copy design 1 - 1  Total score 27 - 29    Immunizations and Health Maintenance Immunization History  Administered Date(s) Administered  . Influenza,inj,Quad PF,36+ Mos 09/15/2013, 09/01/2014, 09/09/2015  . Pneumococcal Conjugate-13 05/10/2015  . Pneumococcal  Polysaccharide-23 01/19/2007  . Tdap 11/20/2009   Health Maintenance Due  Topic Date Due  . ZOSTAVAX  04/22/1983  . INFLUENZA VACCINE  06/20/2016    Patient Care Team: Sharion Balloon, FNP as PCP - General (Nurse Practitioner) Irine Seal, MD as Attending Physician (Urology) Truc Manus Gunning, OD as Consulting Physician (Optometry) Ralene Muskrat as Physician Assistant (Chiropractic Medicine)  Indicate any recent Medical Services you may have received from other than [facility] providers in the past  year (date may be approximate).    Assessment:    Annual Wellness Visit    Screening Tests Health Maintenance  Topic Date Due  . ZOSTAVAX  04/22/1983  . INFLUENZA VACCINE  06/20/2016  . DEXA SCAN  12/02/2016  . TETANUS/TDAP  11/21/2019  . PNA vac Low Risk Adult  Completed        Plan:   During the course of the visit Pallie was educated and counseled about the following appropriate screening and preventive services:   Vaccines to include Pneumoccal, Influenza, Td, Zostavax - received influenza vaccines today.  Shingles vaccine postponed due to dose.  Tdap and pneumo are UTD  Colorectal cancer screening - last colonoscopy was 2014  Cardiovascular disease screening - no recent EKG; last lipid panel showed LDL of 65 - great  Diabetes screening - last FBG was 88  Bone Denisty / Osteoporosis Screening - last DEXA was 12-02-2014.  Due to recheck 2018  Mammogram - no longer required  PAP - no longer required  Glaucoma screening / Eye Exam - last checked 06/2016  Nutrition counseling - limit sodium intake; continue to eat a variety of fruits and vegetables and encouraged to eat whole grains.   Advanced Directives - UTD, copy requested  Physical Activity - continue to do chair exercises and stretches     Patient Instructions (the written plan) were given to the patient.   Cherre Robins, PharmD   08/15/2016

## 2016-08-15 NOTE — Patient Instructions (Addendum)
  Paige Ferguson , Thank you for taking time to come for your Medicare Wellness Visit. I appreciate your ongoing commitment to your health goals. Please review the following plan we discussed and let me know if I can assist you in the future.   These are the goals we discussed:  Look for copy of [place] (important to know where these are kept - you can also bring copy to our office to be placed in our file / electronic chart)  Continue to do chair exercises and stretches  Increase non-starchy vegetables - carrots, green bean, squash, zucchini, tomatoes, onions, peppers, spinach and other green leafy vegetables, cabbage, lettuce, cucumbers, asparagus, okra (not fried), eggplant Limit sugar and processed foods (cakes, cookies, ice cream, crackers and chips) Increase fresh fruit but limit serving sizes 1/2 cup or about the size of tennis or baseball Limit red meat to no more than 1-2 times per week (serving size about the size of your palm) Choose whole grains / lean proteins - whole wheat bread, quinoa, whole grain rice (1/2 cup), fish, chicken, [place] Avoid sugar and calorie containing beverages - soda, sweet tea and juice.  Choose water or unsweetened tea instead.   This is a list of the screening recommended for you and due dates:  Health Maintenance  Topic Date Due  . Shingles Vaccine  04/22/1983 - will postpone until cost is lower  . Flu Shot  06/20/2016  . DEXA scan (bone density measurement)  12/02/2016  . Tetanus Vaccine  11/21/2019  . Pneumonia vaccines  Completed

## 2016-08-30 DIAGNOSIS — M9901 Segmental and somatic dysfunction of cervical region: Secondary | ICD-10-CM | POA: Diagnosis not present

## 2016-08-30 DIAGNOSIS — M9903 Segmental and somatic dysfunction of lumbar region: Secondary | ICD-10-CM | POA: Diagnosis not present

## 2016-08-30 DIAGNOSIS — M5030 Other cervical disc degeneration, unspecified cervical region: Secondary | ICD-10-CM | POA: Diagnosis not present

## 2016-08-30 DIAGNOSIS — M9902 Segmental and somatic dysfunction of thoracic region: Secondary | ICD-10-CM | POA: Diagnosis not present

## 2016-08-30 DIAGNOSIS — M9904 Segmental and somatic dysfunction of sacral region: Secondary | ICD-10-CM | POA: Diagnosis not present

## 2016-09-11 ENCOUNTER — Ambulatory Visit (INDEPENDENT_AMBULATORY_CARE_PROVIDER_SITE_OTHER): Payer: Medicare Other | Admitting: Family

## 2016-09-11 ENCOUNTER — Encounter: Payer: Self-pay | Admitting: Family

## 2016-09-11 VITALS — BP 141/75 | HR 69 | Temp 97.0°F | Ht <= 58 in | Wt 137.8 lb

## 2016-09-11 DIAGNOSIS — M1712 Unilateral primary osteoarthritis, left knee: Secondary | ICD-10-CM | POA: Diagnosis not present

## 2016-09-11 DIAGNOSIS — E782 Mixed hyperlipidemia: Secondary | ICD-10-CM | POA: Diagnosis not present

## 2016-09-11 DIAGNOSIS — I10 Essential (primary) hypertension: Secondary | ICD-10-CM | POA: Diagnosis not present

## 2016-09-11 DIAGNOSIS — M810 Age-related osteoporosis without current pathological fracture: Secondary | ICD-10-CM | POA: Diagnosis not present

## 2016-09-11 DIAGNOSIS — M170 Bilateral primary osteoarthritis of knee: Secondary | ICD-10-CM

## 2016-09-11 DIAGNOSIS — M109 Gout, unspecified: Secondary | ICD-10-CM | POA: Diagnosis not present

## 2016-09-11 DIAGNOSIS — Z905 Acquired absence of kidney: Secondary | ICD-10-CM | POA: Diagnosis not present

## 2016-09-11 DIAGNOSIS — E8881 Metabolic syndrome: Secondary | ICD-10-CM

## 2016-09-11 MED ORDER — MELOXICAM 7.5 MG PO TABS: 7.5000 mg | ORAL_TABLET | Freq: Every day | ORAL | 1 refills | Status: DC

## 2016-09-11 MED ORDER — AMLODIPINE BESYLATE 5 MG PO TABS: 5.0000 mg | ORAL_TABLET | Freq: Every day | ORAL | 4 refills | Status: DC

## 2016-09-11 MED ORDER — FUROSEMIDE 40 MG PO TABS: ORAL_TABLET | ORAL | 4 refills | Status: DC

## 2016-09-11 MED ORDER — RALOXIFENE HCL 60 MG PO TABS: ORAL_TABLET | ORAL | 4 refills | Status: DC

## 2016-09-11 MED ORDER — CARVEDILOL 6.25 MG PO TABS: 6.2500 mg | ORAL_TABLET | Freq: Two times a day (BID) | ORAL | 1 refills | Status: DC

## 2016-09-11 MED ORDER — BENAZEPRIL HCL 40 MG PO TABS: 40.0000 mg | ORAL_TABLET | Freq: Every day | ORAL | 4 refills | Status: DC

## 2016-09-11 NOTE — Patient Instructions (Signed)

## 2016-09-11 NOTE — Progress Notes (Signed)
Subjective:    Patient ID: Paige Ferguson, female    DOB: 1921-08-29, 82 y.o.   MRN: [id number]  Pt presents to the office today for chronic follow up.   Hypertension  This is a chronic problem. The current episode started more than 1 year ago. The problem has been waxing and waning since onset. The problem is uncontrolled. Associated symptoms include peripheral edema ("a little"). Pertinent negatives include no anxiety, headaches, palpitations or shortness of breath. Risk factors for coronary artery disease include dyslipidemia and post-menopausal state. Past treatments include beta blockers, ACE inhibitors, diuretics and calcium channel blockers. The current treatment provides mild improvement. Hypertensive end-organ damage includes kidney disease and CVA. There is no history of CAD/MI, heart failure or a thyroid problem. There is no history of sleep apnea.  Hyperlipidemia  This is a chronic problem. The current episode started more than 1 year ago. The problem is controlled. Recent lipid tests were reviewed and are normal. She has no history of diabetes or hypothyroidism. Factors aggravating her hyperlipidemia include fatty foods. Pertinent negatives include no leg pain, myalgias or shortness of breath. Current antihyperlipidemic treatment includes diet change. The current treatment provides significant (Pt told she could stop her lovastatin) improvement of lipids. Risk factors for coronary artery disease include dyslipidemia, hypertension, post-menopausal and a sedentary lifestyle.  Arthritis  Presents for follow-up visit. The disease course has been fluctuating. She complains of pain. She reports no joint swelling. Affected locations include the right MCP, left MCP, left knee and left wrist. Her pain is at a severity of 6/10. Pertinent negatives include no dry eyes, fever or rash. Her past medical history is significant for osteoarthritis. Her pertinent risk factors include overuse. Past treatments  include NSAIDs and rest. The treatment provided moderate relief. Factors aggravating her arthritis include activity.  Gout Pt stopped her allopurinol 100 mg daily and states she is doing well. PT states she is doing well this and can not remember her last attack. Osteoporosis  PT currently taking Evista 60 mg daily, calcium 1200 mg and D3 100 mg daily. Pt's last Dexa scan 12/02/2014. Metabolic Syndrome PT is on low fat diet and tries be active.   Review of Systems  Constitutional: Negative.  Negative for fever.  HENT: Negative.   Eyes: Negative.   Respiratory: Negative.  Negative for shortness of breath.   Cardiovascular: Negative.  Negative for palpitations.  Gastrointestinal: Negative.   Endocrine: Negative.   Genitourinary: Negative.   Musculoskeletal: Positive for arthritis. Negative for joint swelling and myalgias.  Skin: Negative for rash.  Neurological: Negative.  Negative for headaches.  Hematological: Negative.   Psychiatric/Behavioral: Negative.   All other systems reviewed and are negative.      Objective:   Physical Exam  Constitutional: She is oriented to person, place, and time. She appears well-developed and well-nourished. No distress.  HENT:  Head: Normocephalic and atraumatic.  Right Ear: External ear normal.  Left Ear: External ear normal.  Nose: Nose normal.  Mouth/Throat: Oropharynx is clear and moist.  Eyes: Pupils are equal, round, and reactive to light.  Neck: Normal range of motion. Neck supple. No thyromegaly present.  Cardiovascular: Normal rate, regular rhythm, normal heart sounds and intact distal pulses.   No murmur heard. Pulmonary/Chest: Effort normal and breath sounds normal. No respiratory distress. She has no wheezes.  Abdominal: Soft. Bowel sounds are normal. She exhibits no distension. There is no tenderness.  Musculoskeletal: Normal range of motion. She exhibits edema (2+in BLE). She  exhibits no tenderness.  Neurological: She is alert  and oriented to person, place, and time. She has normal reflexes. No cranial nerve deficit.  Skin: Skin is warm and dry.  Psychiatric: She has a normal mood and affect. Her behavior is normal. Judgment and thought content normal.  Vitals reviewed.     BP (!) 141/75   Pulse 69   Temp 97 F (36.1 C) (Oral)   Ht 4' 9"  (1.448 m)   Wt 137 lb 12.8 oz (62.5 kg)   BMI 29.82 kg/m      Assessment & Plan:  1. Essential hypertension - CMP14+EGFR - amLODipine (NORVASC) 5 MG tablet; Take 1 tablet (5 mg total) by mouth daily.  Dispense: 90 tablet; Refill: 4 - benazepril (LOTENSIN) 40 MG tablet; Take 1 tablet (40 mg total) by mouth daily.  Dispense: 90 tablet; Refill: 4 - carvedilol (COREG) 6.25 MG tablet; Take 1 tablet (6.25 mg total) by mouth 2 (two) times daily with a meal.  Dispense: 180 tablet; Refill: 1 - furosemide (LASIX) 40 MG tablet; Taken one and half tablet by [place]oth daily  Dispense: 135 tablet; Refill: 4  2. Arthritis of knee, left - CMP14+EGFR - Uric acid - meloxicam (MOBIC) 7.5 MG tablet; Take 1 tablet (7.5 mg total) by mouth daily.  Dispense: 90 tablet; Refill: 1  3. Primary osteoarthritis of both knees - CMP14+EGFR - meloxicam (MOBIC) 7.5 MG tablet; Take 1 tablet (7.5 mg total) by mouth daily.  Dispense: 90 tablet; Refill: 1  4. Osteoporosis, unspecified osteoporosis type, unspecified pathological fracture presence - CMP14+EGFR - raloxifene (EVISTA) 60 MG tablet; TAKE 1 TABLET DAILY  Dispense: 90 tablet; Refill: 4  5. Single kidney - CMP14+EGFR  6. Mixed hyperlipidemia - OJZ53+MZUA  7. Metabolic syndrome - UEB91+LWUZ  8. Gout, unspecified cause, unspecified chronicity, unspecified site - CMP14+EGFR - Uric acid    Continue all meds Labs pending Health Maintenance reviewed Diet and exercise encouraged RTO 4 months  Evelina Dun, FNP

## 2016-09-12 LAB — CMP14+EGFR
A/G RATIO: 2 (ref 1.2–2.2)
ALBUMIN: 4.2 g/dL (ref 3.2–4.6)
ALK PHOS: 52 IU/L (ref 39–117)
ALT: 11 IU/L (ref 0–32)
AST: 26 IU/L (ref 0–40)
BUN / CREAT RATIO: 25 (ref 12–28)
BUN: 28 mg/dL (ref 10–36)
Bilirubin Total: 0.8 mg/dL (ref 0.0–1.2)
CO2: 29 mmol/L (ref 18–29)
Calcium: 9.2 mg/dL (ref 8.7–10.3)
Chloride: 102 mmol/L (ref 96–106)
Creatinine, Ser: 1.12 mg/dL — ABNORMAL HIGH (ref 0.57–1.00)
GFR calc Af Amer: 49 mL/min/{1.73_m2} — ABNORMAL LOW (ref >59)
GFR calc non Af Amer: 42 mL/min/{1.73_m2} — ABNORMAL LOW (ref >59)
GLOBULIN, TOTAL: 2.1 g/dL (ref 1.5–4.5)
Glucose: 83 mg/dL (ref 65–99)
POTASSIUM: 4.2 mmol/L (ref 3.5–5.2)
SODIUM: 144 mmol/L (ref 134–144)
Total Protein: 6.3 g/dL (ref 6.0–8.5)

## 2016-09-12 LAB — URIC ACID: URIC ACID: 7.2 mg/dL — AB (ref 2.5–7.1)

## 2016-09-27 DIAGNOSIS — M9902 Segmental and somatic dysfunction of thoracic region: Secondary | ICD-10-CM | POA: Diagnosis not present

## 2016-09-27 DIAGNOSIS — M9904 Segmental and somatic dysfunction of sacral region: Secondary | ICD-10-CM | POA: Diagnosis not present

## 2016-09-27 DIAGNOSIS — M9901 Segmental and somatic dysfunction of cervical region: Secondary | ICD-10-CM | POA: Diagnosis not present

## 2016-09-27 DIAGNOSIS — M9903 Segmental and somatic dysfunction of lumbar region: Secondary | ICD-10-CM | POA: Diagnosis not present

## 2016-09-27 DIAGNOSIS — M5030 Other cervical disc degeneration, unspecified cervical region: Secondary | ICD-10-CM | POA: Diagnosis not present

## 2016-10-21 DIAGNOSIS — H04123 Dry eye syndrome of bilateral lacrimal glands: Secondary | ICD-10-CM | POA: Diagnosis not present

## 2016-10-21 DIAGNOSIS — Z961 Presence of intraocular lens: Secondary | ICD-10-CM | POA: Diagnosis not present

## 2016-10-21 DIAGNOSIS — D2312 Other benign neoplasm of skin of left eyelid, including canthus: Secondary | ICD-10-CM | POA: Diagnosis not present

## 2016-10-21 DIAGNOSIS — H21261 Iris atrophy (essential) (progressive), right eye: Secondary | ICD-10-CM | POA: Diagnosis not present

## 2016-10-24 DIAGNOSIS — R238 Other skin changes: Secondary | ICD-10-CM | POA: Diagnosis not present

## 2016-10-24 DIAGNOSIS — D492 Neoplasm of unspecified behavior of bone, soft tissue, and skin: Secondary | ICD-10-CM | POA: Diagnosis not present

## 2016-10-24 DIAGNOSIS — D485 Neoplasm of uncertain behavior of skin: Secondary | ICD-10-CM | POA: Diagnosis not present

## 2016-10-24 DIAGNOSIS — H11222 Conjunctival granuloma, left eye: Secondary | ICD-10-CM | POA: Diagnosis not present

## 2016-10-24 DIAGNOSIS — H02831 Dermatochalasis of right upper eyelid: Secondary | ICD-10-CM | POA: Diagnosis not present

## 2016-10-24 DIAGNOSIS — H18423 Band keratopathy, bilateral: Secondary | ICD-10-CM | POA: Diagnosis not present

## 2016-10-24 DIAGNOSIS — H02834 Dermatochalasis of left upper eyelid: Secondary | ICD-10-CM | POA: Diagnosis not present

## 2016-10-25 DIAGNOSIS — M5030 Other cervical disc degeneration, unspecified cervical region: Secondary | ICD-10-CM | POA: Diagnosis not present

## 2016-10-25 DIAGNOSIS — M9903 Segmental and somatic dysfunction of lumbar region: Secondary | ICD-10-CM | POA: Diagnosis not present

## 2016-10-25 DIAGNOSIS — M9904 Segmental and somatic dysfunction of sacral region: Secondary | ICD-10-CM | POA: Diagnosis not present

## 2016-10-25 DIAGNOSIS — M9901 Segmental and somatic dysfunction of cervical region: Secondary | ICD-10-CM | POA: Diagnosis not present

## 2016-10-25 DIAGNOSIS — M9902 Segmental and somatic dysfunction of thoracic region: Secondary | ICD-10-CM | POA: Diagnosis not present

## 2016-11-01 DIAGNOSIS — D492 Neoplasm of unspecified behavior of bone, soft tissue, and skin: Secondary | ICD-10-CM | POA: Diagnosis not present

## 2016-11-23 DIAGNOSIS — M9902 Segmental and somatic dysfunction of thoracic region: Secondary | ICD-10-CM | POA: Diagnosis not present

## 2016-11-23 DIAGNOSIS — M9903 Segmental and somatic dysfunction of lumbar region: Secondary | ICD-10-CM | POA: Diagnosis not present

## 2016-11-23 DIAGNOSIS — M9904 Segmental and somatic dysfunction of sacral region: Secondary | ICD-10-CM | POA: Diagnosis not present

## 2016-11-23 DIAGNOSIS — M9901 Segmental and somatic dysfunction of cervical region: Secondary | ICD-10-CM | POA: Diagnosis not present

## 2016-11-23 DIAGNOSIS — M5030 Other cervical disc degeneration, unspecified cervical region: Secondary | ICD-10-CM | POA: Diagnosis not present

## 2016-12-21 DIAGNOSIS — M5030 Other cervical disc degeneration, unspecified cervical region: Secondary | ICD-10-CM | POA: Diagnosis not present

## 2016-12-21 DIAGNOSIS — M9904 Segmental and somatic dysfunction of sacral region: Secondary | ICD-10-CM | POA: Diagnosis not present

## 2016-12-21 DIAGNOSIS — M9901 Segmental and somatic dysfunction of cervical region: Secondary | ICD-10-CM | POA: Diagnosis not present

## 2016-12-21 DIAGNOSIS — M9903 Segmental and somatic dysfunction of lumbar region: Secondary | ICD-10-CM | POA: Diagnosis not present

## 2016-12-21 DIAGNOSIS — M9902 Segmental and somatic dysfunction of thoracic region: Secondary | ICD-10-CM | POA: Diagnosis not present

## 2017-01-12 ENCOUNTER — Encounter: Payer: Self-pay | Admitting: Family

## 2017-01-12 ENCOUNTER — Ambulatory Visit (INDEPENDENT_AMBULATORY_CARE_PROVIDER_SITE_OTHER): Payer: Medicare Other | Admitting: Family

## 2017-01-12 VITALS — BP 136/69 | HR 64 | Temp 96.7°F | Ht <= 58 in | Wt 137.0 lb

## 2017-01-12 DIAGNOSIS — E663 Overweight: Secondary | ICD-10-CM

## 2017-01-12 DIAGNOSIS — I10 Essential (primary) hypertension: Secondary | ICD-10-CM

## 2017-01-12 DIAGNOSIS — M810 Age-related osteoporosis without current pathological fracture: Secondary | ICD-10-CM | POA: Diagnosis not present

## 2017-01-12 DIAGNOSIS — E8881 Metabolic syndrome: Secondary | ICD-10-CM

## 2017-01-12 DIAGNOSIS — E782 Mixed hyperlipidemia: Secondary | ICD-10-CM | POA: Diagnosis not present

## 2017-01-12 DIAGNOSIS — M1712 Unilateral primary osteoarthritis, left knee: Secondary | ICD-10-CM

## 2017-01-12 DIAGNOSIS — E669 Obesity, unspecified: Secondary | ICD-10-CM | POA: Insufficient documentation

## 2017-01-12 DIAGNOSIS — M170 Bilateral primary osteoarthritis of knee: Secondary | ICD-10-CM

## 2017-01-12 MED ORDER — MELOXICAM 7.5 MG PO TABS: 7.5000 mg | ORAL_TABLET | Freq: Every day | ORAL | 1 refills | Status: DC

## 2017-01-12 NOTE — Patient Instructions (Signed)

## 2017-01-12 NOTE — Progress Notes (Signed)
Subjective:    Patient ID: Paige Ferguson, female    DOB: January 28, 1921, 83 y.o.   MRN: [id number]  Pt presents to the office today for chronic follow up.   Hyperlipidemia  This is a chronic problem. The current episode started more than 1 year ago. The problem is controlled. Recent lipid tests were reviewed and are normal. She has no history of diabetes or hypothyroidism. Factors aggravating her hyperlipidemia include fatty foods. Pertinent negatives include no leg pain, myalgias or shortness of breath. Current antihyperlipidemic treatment includes diet change. The current treatment provides significant (Pt told she could stop her lovastatin) improvement of lipids. Risk factors for coronary artery disease include dyslipidemia, hypertension, post-menopausal and a sedentary lifestyle.  Hypertension  This is a chronic problem. The current episode started more than 1 year ago. The problem has been waxing and waning since onset. The problem is uncontrolled. Associated symptoms include peripheral edema ("a little in my left ankle"). Pertinent negatives include no anxiety, headaches, palpitations or shortness of breath. Risk factors for coronary artery disease include dyslipidemia and post-menopausal state. Past treatments include beta blockers, ACE inhibitors, diuretics and calcium channel blockers. The current treatment provides mild improvement. Hypertensive end-organ damage includes kidney disease and CVA. There is no history of CAD/MI or heart failure. There is no history of sleep apnea or a thyroid problem.  Arthritis  Presents for follow-up visit. The disease course has been fluctuating. She complains of pain. She reports no joint swelling. Affected locations include the right MCP, left MCP, left knee and left wrist. Her pain is at a severity of 4/10. Pertinent negatives include no dry eyes, fever or rash. Her past medical history is significant for osteoarthritis. Her pertinent risk factors include overuse.  Past treatments include NSAIDs and rest. The treatment provided moderate relief. Factors aggravating her arthritis include activity.  Gout Pt stopped her allopurinol 100 mg daily and states she is doing well. PT states she is doing well this and can not remember her last attack. Osteoporosis  PT currently taking Evista 60 mg daily, calcium 1200 mg and D3 100 mg daily. Pt's last Dexa scan 12/02/2014. Metabolic Syndrome PT is on low fat diet and tries be active.   Review of Systems  Constitutional: Negative.  Negative for fever.  HENT: Negative.   Eyes: Negative.   Respiratory: Negative.  Negative for shortness of breath.   Cardiovascular: Negative.  Negative for palpitations.  Gastrointestinal: Negative.   Endocrine: Negative.   Genitourinary: Negative.   Musculoskeletal: Positive for arthritis. Negative for joint swelling and myalgias.  Skin: Negative for rash.  Neurological: Negative.  Negative for headaches.  Hematological: Negative.   Psychiatric/Behavioral: Negative.   All other systems reviewed and are negative.      Objective:   Physical Exam  Constitutional: She is oriented to person, place, and time. She appears well-developed and well-nourished. No distress.  HENT:  Head: Normocephalic and atraumatic.  Right Ear: External ear normal.  Left Ear: External ear normal.  Nose: Nose normal.  Mouth/Throat: Oropharynx is clear and moist.  Eyes: Pupils are equal, round, and reactive to light.  Neck: Normal range of motion. Neck supple. No thyromegaly present.  Cardiovascular: Normal rate, regular rhythm, normal heart sounds and intact distal pulses.   No murmur heard. Pulmonary/Chest: Effort normal and breath sounds normal. No respiratory distress. She has no wheezes. Right breast exhibits no inverted nipple, no mass, no nipple discharge, no skin change and no tenderness. Left breast exhibits no inverted  nipple, no mass, no nipple discharge, no skin change and no tenderness.  Breasts are asymmetrical (right larger than left).  Abdominal: Soft. Bowel sounds are normal. She exhibits no distension. There is no tenderness.  Musculoskeletal: Normal range of motion. She exhibits edema (2+in BLE). She exhibits no tenderness.  Neurological: She is alert and oriented to person, place, and time. She has normal reflexes. No cranial nerve deficit.  Skin: Skin is warm and dry.  Psychiatric: She has a normal mood and affect. Her behavior is normal. Judgment and thought content normal.  Vitals reviewed.     BP 136/69   Pulse 64   Temp (!) 96.7 F (35.9 C) (Oral)   Ht 4' 9"  (1.448 m)   Wt 137 lb (62.1 kg)   BMI 29.65 kg/m      Assessment & Plan:  1. Arthritis of knee, left - meloxicam (MOBIC) 7.5 MG tablet; Take 1 tablet (7.5 mg total) by mouth daily.  Dispense: 90 tablet; Refill: 1 - CMP14+EGFR  2. Primary osteoarthritis of both knees - meloxicam (MOBIC) 7.5 MG tablet; Take 1 tablet (7.5 mg total) by mouth daily.  Dispense: 90 tablet; Refill: 1 - CMP14+EGFR  3. Essential hypertension - CMP14+EGFR  4. Osteoporosis, unspecified osteoporosis type, unspecified pathological fracture presence - CMP14+EGFR  5. Mixed hyperlipidemia - CMP14+EGFR - Lipid panel  6. Metabolic syndrome - YHN96+VAEP - Lipid panel  7. Overweight (BMI 25.0-29.9)   Continue all meds Labs pending Health Maintenance reviewed Diet and exercise encouraged RTO 6 months   Evelina Dun, FNP

## 2017-01-13 LAB — LIPID PANEL
CHOLESTEROL TOTAL: 156 mg/dL (ref 100–199)
Chol/HDL Ratio: 3.8 ratio units (ref 0.0–4.4)
HDL: 41 mg/dL (ref >39)
LDL CALC: 94 mg/dL (ref 0–99)
Triglycerides: 103 mg/dL (ref 0–149)
VLDL Cholesterol Cal: 21 mg/dL (ref 5–40)

## 2017-01-13 LAB — CMP14+EGFR
ALBUMIN: 3.8 g/dL (ref 3.2–4.6)
ALK PHOS: 55 IU/L (ref 39–117)
ALT: 11 IU/L (ref 0–32)
AST: 21 IU/L (ref 0–40)
Albumin/Globulin Ratio: 1.6 (ref 1.2–2.2)
BILIRUBIN TOTAL: 0.5 mg/dL (ref 0.0–1.2)
BUN / CREAT RATIO: 31 — AB (ref 12–28)
BUN: 33 mg/dL (ref 10–36)
CO2: 29 mmol/L (ref 18–29)
CREATININE: 1.07 mg/dL — AB (ref 0.57–1.00)
Calcium: 9.8 mg/dL (ref 8.7–10.3)
Chloride: 99 mmol/L (ref 96–106)
GFR calc Af Amer: 52 mL/min/{1.73_m2} — ABNORMAL LOW (ref >59)
GFR calc non Af Amer: 45 mL/min/{1.73_m2} — ABNORMAL LOW (ref >59)
GLOBULIN, TOTAL: 2.4 g/dL (ref 1.5–4.5)
Glucose: 96 mg/dL (ref 65–99)
Potassium: 4.2 mmol/L (ref 3.5–5.2)
SODIUM: 144 mmol/L (ref 134–144)
Total Protein: 6.2 g/dL (ref 6.0–8.5)

## 2017-01-15 ENCOUNTER — Telehealth: Payer: Self-pay | Admitting: Family

## 2017-01-15 NOTE — Telephone Encounter (Signed)
Aware of results. 

## 2017-01-18 DIAGNOSIS — M9902 Segmental and somatic dysfunction of thoracic region: Secondary | ICD-10-CM | POA: Diagnosis not present

## 2017-01-18 DIAGNOSIS — M9904 Segmental and somatic dysfunction of sacral region: Secondary | ICD-10-CM | POA: Diagnosis not present

## 2017-01-18 DIAGNOSIS — M9903 Segmental and somatic dysfunction of lumbar region: Secondary | ICD-10-CM | POA: Diagnosis not present

## 2017-01-18 DIAGNOSIS — M5030 Other cervical disc degeneration, unspecified cervical region: Secondary | ICD-10-CM | POA: Diagnosis not present

## 2017-01-18 DIAGNOSIS — M9901 Segmental and somatic dysfunction of cervical region: Secondary | ICD-10-CM | POA: Diagnosis not present

## 2017-02-15 DIAGNOSIS — M9904 Segmental and somatic dysfunction of sacral region: Secondary | ICD-10-CM | POA: Diagnosis not present

## 2017-02-15 DIAGNOSIS — M5030 Other cervical disc degeneration, unspecified cervical region: Secondary | ICD-10-CM | POA: Diagnosis not present

## 2017-02-15 DIAGNOSIS — M9903 Segmental and somatic dysfunction of lumbar region: Secondary | ICD-10-CM | POA: Diagnosis not present

## 2017-02-15 DIAGNOSIS — M9902 Segmental and somatic dysfunction of thoracic region: Secondary | ICD-10-CM | POA: Diagnosis not present

## 2017-02-15 DIAGNOSIS — M9901 Segmental and somatic dysfunction of cervical region: Secondary | ICD-10-CM | POA: Diagnosis not present

## 2017-02-17 ENCOUNTER — Other Ambulatory Visit: Payer: Self-pay | Admitting: Family

## 2017-02-17 DIAGNOSIS — M1712 Unilateral primary osteoarthritis, left knee: Secondary | ICD-10-CM

## 2017-03-14 DIAGNOSIS — M9902 Segmental and somatic dysfunction of thoracic region: Secondary | ICD-10-CM | POA: Diagnosis not present

## 2017-03-14 DIAGNOSIS — M5030 Other cervical disc degeneration, unspecified cervical region: Secondary | ICD-10-CM | POA: Diagnosis not present

## 2017-03-14 DIAGNOSIS — M9903 Segmental and somatic dysfunction of lumbar region: Secondary | ICD-10-CM | POA: Diagnosis not present

## 2017-03-14 DIAGNOSIS — M9901 Segmental and somatic dysfunction of cervical region: Secondary | ICD-10-CM | POA: Diagnosis not present

## 2017-03-14 DIAGNOSIS — M9904 Segmental and somatic dysfunction of sacral region: Secondary | ICD-10-CM | POA: Diagnosis not present

## 2017-04-05 DIAGNOSIS — M9903 Segmental and somatic dysfunction of lumbar region: Secondary | ICD-10-CM | POA: Diagnosis not present

## 2017-04-05 DIAGNOSIS — M9904 Segmental and somatic dysfunction of sacral region: Secondary | ICD-10-CM | POA: Diagnosis not present

## 2017-04-05 DIAGNOSIS — M5137 Other intervertebral disc degeneration, lumbosacral region: Secondary | ICD-10-CM | POA: Diagnosis not present

## 2017-04-05 DIAGNOSIS — M9905 Segmental and somatic dysfunction of pelvic region: Secondary | ICD-10-CM | POA: Diagnosis not present

## 2017-05-02 DIAGNOSIS — M5137 Other intervertebral disc degeneration, lumbosacral region: Secondary | ICD-10-CM | POA: Diagnosis not present

## 2017-05-02 DIAGNOSIS — M9903 Segmental and somatic dysfunction of lumbar region: Secondary | ICD-10-CM | POA: Diagnosis not present

## 2017-05-02 DIAGNOSIS — M9905 Segmental and somatic dysfunction of pelvic region: Secondary | ICD-10-CM | POA: Diagnosis not present

## 2017-05-02 DIAGNOSIS — M9904 Segmental and somatic dysfunction of sacral region: Secondary | ICD-10-CM | POA: Diagnosis not present

## 2017-05-21 ENCOUNTER — Other Ambulatory Visit: Payer: Self-pay | Admitting: Family

## 2017-05-21 DIAGNOSIS — M1712 Unilateral primary osteoarthritis, left knee: Secondary | ICD-10-CM

## 2017-05-24 DIAGNOSIS — M9904 Segmental and somatic dysfunction of sacral region: Secondary | ICD-10-CM | POA: Diagnosis not present

## 2017-05-24 DIAGNOSIS — M9905 Segmental and somatic dysfunction of pelvic region: Secondary | ICD-10-CM | POA: Diagnosis not present

## 2017-05-24 DIAGNOSIS — M9903 Segmental and somatic dysfunction of lumbar region: Secondary | ICD-10-CM | POA: Diagnosis not present

## 2017-05-24 DIAGNOSIS — M5137 Other intervertebral disc degeneration, lumbosacral region: Secondary | ICD-10-CM | POA: Diagnosis not present

## 2017-05-29 ENCOUNTER — Other Ambulatory Visit: Payer: Self-pay | Admitting: Family

## 2017-05-29 DIAGNOSIS — I10 Essential (primary) hypertension: Secondary | ICD-10-CM

## 2017-07-12 ENCOUNTER — Encounter: Payer: Self-pay | Admitting: Family

## 2017-07-12 ENCOUNTER — Ambulatory Visit (INDEPENDENT_AMBULATORY_CARE_PROVIDER_SITE_OTHER): Payer: Medicare Other | Admitting: Family

## 2017-07-12 VITALS — BP 155/82 | HR 70 | Temp 97.2°F | Ht <= 58 in | Wt 142.0 lb

## 2017-07-12 DIAGNOSIS — E669 Obesity, unspecified: Secondary | ICD-10-CM

## 2017-07-12 DIAGNOSIS — M1712 Unilateral primary osteoarthritis, left knee: Secondary | ICD-10-CM | POA: Diagnosis not present

## 2017-07-12 DIAGNOSIS — I10 Essential (primary) hypertension: Secondary | ICD-10-CM

## 2017-07-12 DIAGNOSIS — E782 Mixed hyperlipidemia: Secondary | ICD-10-CM | POA: Diagnosis not present

## 2017-07-12 DIAGNOSIS — M170 Bilateral primary osteoarthritis of knee: Secondary | ICD-10-CM | POA: Diagnosis not present

## 2017-07-12 DIAGNOSIS — M810 Age-related osteoporosis without current pathological fracture: Secondary | ICD-10-CM

## 2017-07-12 MED ORDER — AMLODIPINE BESYLATE 5 MG PO TABS: 5.0000 mg | ORAL_TABLET | Freq: Every day | ORAL | 4 refills | Status: DC

## 2017-07-12 MED ORDER — FUROSEMIDE 40 MG PO TABS: ORAL_TABLET | ORAL | 4 refills | Status: DC

## 2017-07-12 MED ORDER — CARVEDILOL 6.25 MG PO TABS: ORAL_TABLET | ORAL | 3 refills | Status: DC

## 2017-07-12 MED ORDER — RALOXIFENE HCL 60 MG PO TABS: ORAL_TABLET | ORAL | 4 refills | Status: DC

## 2017-07-12 MED ORDER — MELOXICAM 7.5 MG PO TABS: 7.5000 mg | ORAL_TABLET | Freq: Every day | ORAL | 1 refills | Status: DC

## 2017-07-12 MED ORDER — BENAZEPRIL HCL 40 MG PO TABS: 40.0000 mg | ORAL_TABLET | Freq: Every day | ORAL | 4 refills | Status: DC

## 2017-07-12 NOTE — Patient Instructions (Signed)

## 2017-07-12 NOTE — Progress Notes (Signed)
Subjective:    Patient ID: Paige Ferguson, female    DOB: 07/17/1921, 83 y.o.   MRN: [id number]  Pt presents to the office today for chronic follow up Hypertension  This is a chronic problem. The current episode started more than 1 year ago. The problem has been resolved since onset. The problem is controlled. Pertinent negatives include no headaches, malaise/fatigue, peripheral edema or shortness of breath. Risk factors for coronary artery disease include dyslipidemia, obesity and sedentary lifestyle. The current treatment provides mild improvement.  Hyperlipidemia  This is a chronic problem. The current episode started more than 1 year ago. The problem is controlled. Recent lipid tests were reviewed and are normal. Pertinent negatives include no shortness of breath. Current antihyperlipidemic treatment includes herbal therapy. The current treatment provides moderate improvement of lipids. Risk factors for coronary artery disease include dyslipidemia, obesity, post-menopausal and a sedentary lifestyle.  Arthritis  Presents for follow-up visit. She complains of pain and stiffness. Affected locations include the right knee and left knee. Her pain is at a severity of 8/10.  Osteoporosis Pt taking Evista 60 mg and Calcium and Vit D.    Review of Systems  Constitutional: Negative for malaise/fatigue.  Respiratory: Negative for shortness of breath.   Cardiovascular: Positive for leg swelling.  Musculoskeletal: Positive for arthritis and stiffness.  Neurological: Negative for headaches.  All other systems reviewed and are negative.      Objective:   Physical Exam  Constitutional: She is oriented to person, place, and time. She appears well-developed and well-nourished. No distress.  HENT:  Head: Normocephalic and atraumatic.  Right Ear: External ear normal.  Left Ear: External ear normal.  Nose: Nose normal.  Mouth/Throat: Oropharynx is clear and moist.  Eyes: Pupils are equal, round, and  reactive to light.  Neck: Normal range of motion. Neck supple. No thyromegaly present.  Cardiovascular: Normal rate, regular rhythm, normal heart sounds and intact distal pulses.   No murmur heard. Pulmonary/Chest: Effort normal and breath sounds normal. No respiratory distress. She has no wheezes.  Abdominal: Soft. Bowel sounds are normal. She exhibits no distension. There is no tenderness.  Musculoskeletal: Normal range of motion. She exhibits edema (2+ BLE). She exhibits no tenderness.  Using rolling walker  Neurological: She is alert and oriented to person, place, and time.  Skin: Skin is warm and dry.  Psychiatric: She has a normal mood and affect. Her behavior is normal. Judgment and thought content normal.  Vitals reviewed.     BP (!) 155/82   Pulse 70   Temp (!) 97.2 F (36.2 C) (Oral)   Ht 4' 9"  (1.448 m)   Wt 142 lb (64.4 kg)   BMI 30.73 kg/m      Assessment & Plan:  1. Essential hypertension - amLODipine (NORVASC) 5 MG tablet; Take 1 tablet (5 mg total) by mouth daily.  Dispense: 90 tablet; Refill: 4 - benazepril (LOTENSIN) 40 MG tablet; Take 1 tablet (40 mg total) by mouth daily.  Dispense: 90 tablet; Refill: 4 - carvedilol (COREG) 6.25 MG tablet; TAKE  (1)  TABLET TWICE A DAY WITH MEALS (BREAKFAST AND SUPPER)  Dispense: 180 tablet; Refill: 3 - furosemide (LASIX) 40 MG tablet; Taken one and half tablet by muoth daily  Dispense: 135 tablet; Refill: 4 - CMP14+EGFR  2. Primary osteoarthritis of both knees - meloxicam (MOBIC) 7.5 MG tablet; Take 1 tablet (7.5 mg total) by mouth daily.  Dispense: 90 tablet; Refill: 1 - CMP14+EGFR  3. Mixed hyperlipidemia -  CMP14+EGFR - Lipid panel  4. Obesity (BMI 30-39.9) - CMP14+EGFR  5. Osteoporosis, unspecified osteoporosis type, unspecified pathological fracture presence  - raloxifene (EVISTA) 60 MG tablet; TAKE 1 TABLET DAILY  Dispense: 90 tablet; Refill: 4 - CMP14+EGFR  6. Arthritis of knee, left - meloxicam (MOBIC)  7.5 MG tablet; Take 1 tablet (7.5 mg total) by mouth daily.  Dispense: 90 tablet; Refill: 1 - CMP14+EGFR  Continue all meds Labs pending Health Maintenance reviewed Diet and exercise encouraged RTO 6 months   Evelina Dun, FNP

## 2017-07-13 LAB — CMP14+EGFR
ALK PHOS: 57 IU/L (ref 39–117)
ALT: 11 IU/L (ref 0–32)
AST: 25 IU/L (ref 0–40)
Albumin/Globulin Ratio: 1.5 (ref 1.2–2.2)
Albumin: 4.2 g/dL (ref 3.2–4.6)
BILIRUBIN TOTAL: 0.6 mg/dL (ref 0.0–1.2)
BUN / CREAT RATIO: 20 (ref 12–28)
BUN: 17 mg/dL (ref 10–36)
CHLORIDE: 103 mmol/L (ref 96–106)
CO2: 28 mmol/L (ref 20–29)
CREATININE: 0.85 mg/dL (ref 0.57–1.00)
Calcium: 9.9 mg/dL (ref 8.7–10.3)
GFR calc Af Amer: 68 mL/min/{1.73_m2} (ref >59)
GFR calc non Af Amer: 59 mL/min/{1.73_m2} — ABNORMAL LOW (ref >59)
GLUCOSE: 86 mg/dL (ref 65–99)
Globulin, Total: 2.8 g/dL (ref 1.5–4.5)
Potassium: 4 mmol/L (ref 3.5–5.2)
Sodium: 146 mmol/L — ABNORMAL HIGH (ref 134–144)
Total Protein: 7 g/dL (ref 6.0–8.5)

## 2017-07-13 LAB — LIPID PANEL
CHOLESTEROL TOTAL: 157 mg/dL (ref 100–199)
Chol/HDL Ratio: 3.4 ratio (ref 0.0–4.4)
HDL: 46 mg/dL (ref >39)
LDL CALC: 95 mg/dL (ref 0–99)
TRIGLYCERIDES: 80 mg/dL (ref 0–149)
VLDL CHOLESTEROL CAL: 16 mg/dL (ref 5–40)

## 2017-07-25 ENCOUNTER — Encounter: Payer: Self-pay | Admitting: *Deleted

## 2017-08-21 ENCOUNTER — Ambulatory Visit (INDEPENDENT_AMBULATORY_CARE_PROVIDER_SITE_OTHER): Payer: Medicare Other | Admitting: *Deleted

## 2017-08-21 ENCOUNTER — Encounter: Payer: Self-pay | Admitting: *Deleted

## 2017-08-21 VITALS — BP 175/65 | HR 58 | Ht <= 58 in | Wt 139.0 lb

## 2017-08-21 DIAGNOSIS — Z0000 Encounter for general adult medical examination without abnormal findings: Secondary | ICD-10-CM | POA: Diagnosis not present

## 2017-08-21 NOTE — Progress Notes (Signed)
Subjective:   Paige Ferguson is a 83 y.o. female who presents for a subsequent Medicare Annual Wellness Visit. Paige Ferguson is widowed and lives at home with her grandson. Her daughter and her grandson do a lot to help her and he brought her to her appointment today. She has 3 children and several grandchildren and great grandchildren. She is active in her church and enjoys word search and other puzzles. She is also taking a genealogy class and is putting together family photo albums.  Review of Systems    Reports that her health is about the same as last year.   Cardiac Risk Factors include: advanced age (>101men, >18 women);dyslipidemia;hypertension;sedentary lifestyle  Musculoskeletal: bilateral knee pain due to arthritis and right shoulder discomfort  Other systems negative today    Objective:    BP (!) 175/65 (BP Location: Left Arm, Patient Position: Sitting, Cuff Size: Normal)   Pulse (!) 58   Ht 4\' 9"  (1.448 m)   Wt 139 lb (63 kg)   BMI 30.08 kg/m     Current Medications (verified) Outpatient Encounter Prescriptions as of 08/21/2017  Medication Sig  . amLODipine (NORVASC) 5 MG tablet Take 1 tablet (5 mg total) by mouth daily.  . benazepril (LOTENSIN) 40 MG tablet Take 1 tablet (40 mg total) by mouth daily.  . Calcium-Magnesium-Vitamin D (CALCIUM 1200+D3 PO) Take by mouth.  . carvedilol (COREG) 6.25 MG tablet TAKE  (1)  TABLET TWICE A DAY WITH MEALS (BREAKFAST AND SUPPER)  . fexofenadine (ALLEGRA) 30 MG tablet Take 1 tablet (30 mg total) by mouth daily. Over the counter  . furosemide (LASIX) 40 MG tablet Taken one and half tablet by [employer] daily  . meloxicam (MOBIC) 7.5 MG tablet Take 1 tablet (7.5 mg total) by mouth daily.  . Misc Natural Products (GNP GLUCOSAME CHONDROITIN DS) TABS Take by mouth.  . Misc. Devices (STEP N REST II WALKER) MISC by Does not apply route daily. PT HAS RX. FOR ROLLING WALKER WITH SEAT AND HANDBRAKES .   . Multiple Vitamins-Minerals (CENTRUM SILVER  ADULT 50+) TABS Take 1 tablet by mouth daily.  . Omega-3 Fatty Acids (FISH OIL) 1000 MG CAPS Take 1 capsule by mouth 2 (two) times daily.  . raloxifene (EVISTA) 60 MG tablet TAKE 1 TABLET DAILY  . vitamin E 200 UNIT capsule Take 200 Units by mouth daily.   No facility-administered encounter medications on file as of 08/21/2017.     Allergies (verified) Sulfa antibiotics; Amoxicillin; and Nitrofurantoin monohyd macro   History: Past Medical History:  Diagnosis Date  . Allergy   . Arthritis of knee   . Cataract   . Cholelithiases   . Chronic kidney disease   . Endometrial polyp 7/98   2 degree polyps   . Genu valgum   . Gout   . Hyperlipidemia   . Hypertension   . Left knee pain   . Osteoarthritis   . Osteoporosis   . Postmenopausal bleeding   . Recurrent UTI   . Renal abscess, right   . Shoulder fracture, left   . TIA (transient ischemic attack)   . Ureteral calculi   . URI (upper respiratory infection)    Past Surgical History:  Procedure Laterality Date  . EYE SURGERY    . HERNIA REPAIR    . HYSTEROSCOPY , FRACTIONAL D&C  7/98/  . [place]  . LEFT CATARACT SURGERY  10/99  . RIGHT CATARACT SURGERY  7/99  Dr. Dolores Ferguson   . RIGHT HEMINEPHRECTOMY FOR ABSCESS  2/98  . RIGHT NEPHRELITHOTOMY  1982   Family History  Problem Relation Age of Onset  . Stroke Maternal Grandmother   . Hypertension Maternal Grandmother   . Diabetes Maternal Grandmother   . Pneumonia Father   . Coronary artery disease Paternal Grandfather   . Heart defect Son    Social History   Occupational History  . Not on file.   Social History Main Topics  . Smoking status: Former Smoker    Types: Cigarettes    Quit date: 11/20/1944  . Smokeless tobacco: Never Used  . Alcohol use No  . Drug use: No  . Sexual activity: No    Tobacco Counseling No tobacco use  Activities of Daily Living In your present state of health, do you have any difficulty performing the following  activities: 08/21/2017  Hearing? N  Vision? N  Difficulty concentrating or making decisions? Y  Comment Has some trouble with remembering names of people she doesn't see often  Walking or climbing stairs? Y  Comment Uses a walker. Has osteoarthritis in knees and has some knee pain and difficulty walking  Dressing or bathing? N  Doing errands, shopping? Y  Comment Has some help from her grandson and her daughter  Conservation officer, nature and eating ? Y  Comment Her grandson helps with the cooking  Using the Toilet? N  In the past six months, have you accidently leaked urine? N  Do you have problems with loss of bowel control? N  Managing your Medications? N  Managing your Finances? Y  Comment daughter and grandson help with this  Housekeeping or managing your Housekeeping? Y  Comment daughter and grandson help with this  Some recent data might be hidden    Immunizations and Health Maintenance Immunization History  Administered Date(s) Administered  . Influenza, High Dose Seasonal PF 08/15/2016  . Influenza,inj,Quad PF,6+ Mos 09/15/2013, 09/01/2014, 09/09/2015  . Pneumococcal Conjugate-13 05/10/2015  . Pneumococcal Polysaccharide-23 01/19/2007  . Tdap 11/20/2009   Health Maintenance Due  Topic Date Due  . DEXA SCAN  12/02/2016    Patient Care Team: Paige Balloon, FNP as PCP - General (Nurse Practitioner) Paige Seal, MD as Attending Physician (Urology) Paige Ferguson, OD as Consulting Physician (Optometry) Paige Ferguson as Physician Assistant (Chiropractic Medicine)  No hospitalizations, ER visits, or surgeries this past year.      Assessment:   This is a routine wellness examination for Paige Ferguson.   Hearing/Vision screen No deficits noted during visit. Eye exam is due soon.   Dietary issues and exercise activities discussed: Current Exercise Habits: Home exercise routine, Type of exercise: Other - see comments (Chair exercises), Time (Minutes): 15, Frequency (Times/Week):  3, Weekly Exercise (Minutes/Week): 45, Intensity: Mild, Exercise limited by: orthopedic condition(s)  Diet: Eats 3 meals a day. No problems with access to food. Grandson prepares most meals.   Goals    . Increase physical activity          Review handout of chair exercises you can do daily at home.      . Reduce sugar intake to X grams per day          Limit intake of sweets and sugar       Depression Screen PHQ 2/9 Scores 08/21/2017 07/12/2017 01/12/2017 09/11/2016 08/15/2016 05/11/2016 01/10/2016  PHQ - 2 Score 0 0 0 0 0 0 0    Fall Risk Fall Risk  08/21/2017 07/12/2017 01/12/2017  09/11/2016 08/15/2016  Falls in the past year? No No No No No  Number falls in past yr: - - - - -  Injury with Fall? - - - - -  Risk for fall due to : - - - - -    Cognitive Function: MMSE - Mini Mental State Exam 08/21/2017 08/15/2016 08/15/2016 05/28/2015  Orientation to time 5 5 5 5   Orientation to Place 5 5 5 5   Registration 3 3 3 3   Attention/ Calculation 5 2 2 4   Recall 3 3 3 3   Language- name 2 objects 2 2 2 2   Language- repeat 1 1 1 1   Language- follow 3 step command 3 3 3 3   Language- read & follow direction 1 1 1 1   Write a sentence 1 1 - 1  Copy design 1 1 - 1  Total score 30 27 - 29  Normal exam      Screening Tests Health Maintenance  Topic Date Due  . DEXA SCAN  12/02/2016  . INFLUENZA VACCINE  03/12/2018 (Originally 06/20/2017)  . TETANUS/TDAP  11/21/2019  . PNA vac Low Risk Adult  Completed      Plan:  Continue to stay as active as possible Move carefully to avoid falls Flu shot given today Keep f/u appt with PCP Schedule eye exam  I have personally reviewed and noted the following in the patient's chart:   . Medical and social history . Use of alcohol, tobacco or illicit drugs  . Current medications and supplements . Functional ability and status . Nutritional status . Physical activity . Advanced directives . List of other physicians . Hospitalizations,  surgeries, and ER visits in previous 12 months . Vitals . Screenings to include cognitive, depression, and falls . Referrals and appointments  In addition, I have reviewed and discussed with patient certain preventive protocols, quality metrics, and best practice recommendations. A written personalized care plan for preventive services as well as general preventive health recommendations were provided to patient.     Chong Sicilian, RN  08/21/2017   I have reviewed and agree with the above AWV documentation.   Evelina Dun, FNP

## 2017-08-21 NOTE — Patient Instructions (Signed)
  Paige Ferguson , Thank you for taking time to come for your Medicare Wellness Visit. I appreciate your ongoing commitment to your health goals. Please review the following plan we discussed and let me know if I can assist you in the future.   These are the goals we discussed: Goals    . Increase physical activity          Review handout of chair exercises you can do daily at home.      . Reduce sugar intake to X grams per day          Limit intake of sweets and sugar        This is a list of the screening recommended for you and due dates:  Health Maintenance  Topic Date Due  . DEXA scan (bone density measurement)  12/02/2016  . Flu Shot  03/12/2018*  . Tetanus Vaccine  11/21/2019  . Pneumonia vaccines  Completed  *Topic was postponed. The date shown is not the original due date.

## 2017-08-24 ENCOUNTER — Other Ambulatory Visit: Payer: Self-pay | Admitting: Family

## 2017-11-05 DIAGNOSIS — M9904 Segmental and somatic dysfunction of sacral region: Secondary | ICD-10-CM | POA: Diagnosis not present

## 2017-11-05 DIAGNOSIS — M5137 Other intervertebral disc degeneration, lumbosacral region: Secondary | ICD-10-CM | POA: Diagnosis not present

## 2017-11-05 DIAGNOSIS — M9905 Segmental and somatic dysfunction of pelvic region: Secondary | ICD-10-CM | POA: Diagnosis not present

## 2017-11-05 DIAGNOSIS — M9903 Segmental and somatic dysfunction of lumbar region: Secondary | ICD-10-CM | POA: Diagnosis not present

## 2017-11-08 DIAGNOSIS — Z0289 Encounter for other administrative examinations: Secondary | ICD-10-CM

## 2017-12-03 DIAGNOSIS — M9903 Segmental and somatic dysfunction of lumbar region: Secondary | ICD-10-CM | POA: Diagnosis not present

## 2017-12-03 DIAGNOSIS — M5137 Other intervertebral disc degeneration, lumbosacral region: Secondary | ICD-10-CM | POA: Diagnosis not present

## 2017-12-03 DIAGNOSIS — M9904 Segmental and somatic dysfunction of sacral region: Secondary | ICD-10-CM | POA: Diagnosis not present

## 2017-12-03 DIAGNOSIS — M9905 Segmental and somatic dysfunction of pelvic region: Secondary | ICD-10-CM | POA: Diagnosis not present

## 2017-12-05 ENCOUNTER — Encounter: Payer: Self-pay | Admitting: *Deleted

## 2017-12-19 ENCOUNTER — Ambulatory Visit (INDEPENDENT_AMBULATORY_CARE_PROVIDER_SITE_OTHER): Payer: Medicare Other | Admitting: Physician Assistant

## 2017-12-19 ENCOUNTER — Encounter: Payer: Self-pay | Admitting: Physician Assistant

## 2017-12-19 VITALS — BP 155/80 | HR 69 | Temp 97.4°F | Ht <= 58 in | Wt 138.0 lb

## 2017-12-19 DIAGNOSIS — L0291 Cutaneous abscess, unspecified: Secondary | ICD-10-CM | POA: Diagnosis not present

## 2017-12-19 MED ORDER — CEPHALEXIN 500 MG PO CAPS: 500.0000 mg | ORAL_CAPSULE | Freq: Three times a day (TID) | ORAL | 0 refills | Status: DC

## 2017-12-19 NOTE — Patient Instructions (Signed)
In a few days you may receive a survey in the mail or online from [employer] regarding your visit with us today. Please take a moment to fill this out. Your feedback is very important to our whole office. It can help us better understand your needs as well as improve your experience and satisfaction. Thank you for taking your time to complete it. We care about you.  Karah Caruthers, PA-C  

## 2017-12-19 NOTE — Progress Notes (Signed)
BP (!) 155/80   Pulse 69   Temp (!) 97.4 F (36.3 C) (Oral)   Ht 4\' 9"  (1.448 m)   Wt 138 lb (62.6 kg)   BMI 29.86 kg/m    Subjective:    Patient ID: Paige Ferguson, female    DOB: 06/10/1921, 84 y.o.   MRN: [id number]  HPI: [name] is a 84 y.o. female presenting on 12/19/2017 for Recurrent Skin Infections (Left eye lid )  1 day ago the patient noticed a red swollen area on her left eyelid.  It is in the center and above the lash line.  There is only a minimal amount of soreness.  There is no drainage.  She reports that her daughter said there was a whitehead to it yesterday.  She has used heat and cold on it over the past 24 hours.  She has had some crusting on her eyelashes.  Relevant past medical, surgical, family and social history reviewed and updated as indicated. Allergies and medications reviewed and updated.  Past Medical History:  Diagnosis Date  . Allergy   . Arthritis of knee   . Cataract   . Cholelithiases   . Chronic kidney disease   . Endometrial polyp 7/98   2 degree polyps   . Genu valgum   . Gout   . Hyperlipidemia   . Hypertension   . Left knee pain   . Osteoarthritis   . Osteoporosis   . Postmenopausal bleeding   . Recurrent UTI   . Renal abscess, right   . Shoulder fracture, left   . TIA (transient ischemic attack)   . Ureteral calculi   . URI (upper respiratory infection)     Past Surgical History:  Procedure Laterality Date  . EYE SURGERY    . HERNIA REPAIR    . HYSTEROSCOPY , FRACTIONAL D&C  7/98/  . [place]  . LEFT CATARACT SURGERY  10/99  . RIGHT CATARACT SURGERY  7/99   Dr. Dolores Lory   . RIGHT HEMINEPHRECTOMY FOR ABSCESS  2/98  . RIGHT NEPHRELITHOTOMY  1982    Review of Systems  Constitutional: Negative.   HENT: Negative.   Eyes: Positive for pain, discharge and redness. Negative for visual disturbance.  Respiratory: Negative.   Gastrointestinal: Negative.   Genitourinary: Negative.     Allergies as  of 12/19/2017      Reactions   Sulfa Antibiotics Nausea And Vomiting   Amoxicillin    Nitrofurantoin Monohyd Macro       Medication List        Accurate as of 12/19/17 12:30 PM. Always use your most recent med list.          amLODipine 5 MG tablet Commonly known as:  NORVASC Take 1 tablet (5 mg total) by mouth daily.   benazepril 40 MG tablet Commonly known as:  LOTENSIN Take 1 tablet (40 mg total) by mouth daily.   CALCIUM 1200+D3 PO Take by mouth.   carvedilol 6.25 MG tablet Commonly known as:  COREG TAKE  (1)  TABLET TWICE A DAY WITH MEALS (BREAKFAST AND SUPPER)   CENTRUM SILVER ADULT 50+ Tabs Take 1 tablet by mouth daily.   cephALEXin 500 MG capsule Commonly known as:  KEFLEX Take 1 capsule (500 mg total) by mouth 3 (three) times daily.   fexofenadine 30 MG tablet Commonly known as:  ALLEGRA Take 1 tablet (30 mg total) by mouth daily. Over the counter   Fish Oil  1000 MG Caps Take 1 capsule by mouth 2 (two) times daily.   furosemide 40 MG tablet Commonly known as:  LASIX Taken one and half tablet by [facility]oth daily   GNP GLUCOSAME CHONDROITIN DS Tabs Take by mouth.   meloxicam 7.5 MG tablet Commonly known as:  MOBIC Take 1 tablet (7.5 mg total) by mouth daily.   raloxifene 60 MG tablet Commonly known as:  EVISTA TAKE 1 TABLET DAILY   STEP N REST II WALKER Misc by Does not apply route daily. PT HAS RX. FOR ROLLING WALKER WITH SEAT AND HANDBRAKES .   vitamin E 200 UNIT capsule Take 200 Units by mouth daily.          Objective:    BP (!) 155/80   Pulse 69   Temp (!) 97.4 F (36.3 C) (Oral)   Ht 4\' 9"  (1.448 m)   Wt 138 lb (62.6 kg)   BMI 29.86 kg/m   Allergies  Allergen Reactions  . Sulfa Antibiotics Nausea And Vomiting  . Amoxicillin   . Nitrofurantoin Monohyd Macro     Physical Exam  Constitutional: She is oriented to person, place, and time. She appears well-developed and well-nourished.  HENT:  Head: Normocephalic and atraumatic.    Eyes: Conjunctivae and EOM are normal. Pupils are equal, round, and reactive to light. Left eye exhibits no discharge and no exudate.    Left eyelid with a 5 mm well-circumscribed erythematous lesion, no drainage, minimal redness surrounding it.  The remainder of her eye exams bilaterally are normal.  Cardiovascular: Normal rate, regular rhythm, normal heart sounds and intact distal pulses.  Pulmonary/Chest: Effort normal and breath sounds normal.  Abdominal: Soft. Bowel sounds are normal.  Neurological: She is alert and oriented to person, place, and time. She has normal reflexes.  Skin: Skin is warm and dry. No rash noted.  Psychiatric: She has a normal mood and affect. Her behavior is normal. Judgment and thought content normal.        Assessment & Plan:   1. Abscess - cephALEXin (KEFLEX) 500 MG capsule; Take 1 capsule (500 mg total) by mouth 3 (three) times daily.  Dispense: 30 capsule; Refill: 0    Current Outpatient Medications:  .  amLODipine (NORVASC) 5 MG tablet, Take 1 tablet (5 mg total) by mouth daily., Disp: 90 tablet, Rfl: 4 .  benazepril (LOTENSIN) 40 MG tablet, Take 1 tablet (40 mg total) by mouth daily., Disp: 90 tablet, Rfl: 4 .  Calcium-Magnesium-Vitamin D (CALCIUM 1200+D3 PO), Take by mouth., Disp: , Rfl:  .  carvedilol (COREG) 6.25 MG tablet, TAKE  (1)  TABLET TWICE A DAY WITH MEALS (BREAKFAST AND SUPPER), Disp: 180 tablet, Rfl: 3 .  cephALEXin (KEFLEX) 500 MG capsule, Take 1 capsule (500 mg total) by mouth 3 (three) times daily., Disp: 30 capsule, Rfl: 0 .  fexofenadine (ALLEGRA) 30 MG tablet, Take 1 tablet (30 mg total) by mouth daily. Over the counter, Disp: 90 tablet, Rfl: 3 .  furosemide (LASIX) 40 MG tablet, Taken one and half tablet by [employer]oth daily, Disp: 135 tablet, Rfl: 4 .  meloxicam (MOBIC) 7.5 MG tablet, Take 1 tablet (7.5 mg total) by mouth daily., Disp: 90 tablet, Rfl: 1 .  Misc Natural Products (GNP GLUCOSAME CHONDROITIN DS) TABS, Take by mouth.,  Disp: , Rfl:  .  Misc. Devices (STEP N REST II WALKER) MISC, by Does not apply route daily. PT HAS RX. FOR ROLLING WALKER WITH SEAT AND HANDBRAKES . , Disp: ,  Rfl:  .  Multiple Vitamins-Minerals (CENTRUM SILVER ADULT 50+) TABS, Take 1 tablet by mouth daily., Disp: , Rfl:  .  Omega-3 Fatty Acids (FISH OIL) 1000 MG CAPS, Take 1 capsule by mouth 2 (two) times daily., Disp: , Rfl:  .  raloxifene (EVISTA) 60 MG tablet, TAKE 1 TABLET DAILY, Disp: 90 tablet, Rfl: 4 .  vitamin E 200 UNIT capsule, Take 200 Units by mouth daily., Disp: , Rfl:  Continue all other maintenance medications as listed above.  Follow up plan: Return if symptoms worsen or fail to improve.  Educational handout given for [place] PA-C [place] [address]  [place], [place] [address] [phone]   12/19/2017, 12:30 PM

## 2017-12-31 DIAGNOSIS — M9903 Segmental and somatic dysfunction of lumbar region: Secondary | ICD-10-CM | POA: Diagnosis not present

## 2017-12-31 DIAGNOSIS — M9905 Segmental and somatic dysfunction of pelvic region: Secondary | ICD-10-CM | POA: Diagnosis not present

## 2017-12-31 DIAGNOSIS — M5137 Other intervertebral disc degeneration, lumbosacral region: Secondary | ICD-10-CM | POA: Diagnosis not present

## 2017-12-31 DIAGNOSIS — M9904 Segmental and somatic dysfunction of sacral region: Secondary | ICD-10-CM | POA: Diagnosis not present

## 2018-01-07 ENCOUNTER — Ambulatory Visit: Payer: Medicare Other | Admitting: Family

## 2018-01-08 ENCOUNTER — Ambulatory Visit (INDEPENDENT_AMBULATORY_CARE_PROVIDER_SITE_OTHER): Payer: Medicare Other | Admitting: Family

## 2018-01-08 ENCOUNTER — Encounter: Payer: Self-pay | Admitting: Family

## 2018-01-08 VITALS — BP 141/81 | HR 67 | Temp 97.1°F | Ht <= 58 in | Wt 138.8 lb

## 2018-01-08 DIAGNOSIS — I10 Essential (primary) hypertension: Secondary | ICD-10-CM

## 2018-01-08 DIAGNOSIS — E669 Obesity, unspecified: Secondary | ICD-10-CM | POA: Diagnosis not present

## 2018-01-08 DIAGNOSIS — Z905 Acquired absence of kidney: Secondary | ICD-10-CM

## 2018-01-08 DIAGNOSIS — H00014 Hordeolum externum left upper eyelid: Secondary | ICD-10-CM

## 2018-01-08 DIAGNOSIS — M1712 Unilateral primary osteoarthritis, left knee: Secondary | ICD-10-CM | POA: Diagnosis not present

## 2018-01-08 DIAGNOSIS — E782 Mixed hyperlipidemia: Secondary | ICD-10-CM | POA: Diagnosis not present

## 2018-01-08 DIAGNOSIS — M170 Bilateral primary osteoarthritis of knee: Secondary | ICD-10-CM

## 2018-01-08 MED ORDER — HYDROCODONE-HOMATROPINE 5-1.5 MG/5ML PO SYRP: 5.0000 mL | ORAL_SOLUTION | Freq: Three times a day (TID) | ORAL | 0 refills | Status: DC | PRN

## 2018-01-08 MED ORDER — MELOXICAM 7.5 MG PO TABS: 7.5000 mg | ORAL_TABLET | Freq: Every day | ORAL | 1 refills | Status: DC

## 2018-01-08 NOTE — Patient Instructions (Addendum)

## 2018-01-08 NOTE — Progress Notes (Signed)
Subjective:    Patient ID: Paige Ferguson, female    DOB: 08/10/1921, 84 y.o.   MRN: [id number]  PT presents to the office today for chronic follow up.  Hypertension  This is a chronic problem. The current episode started more than 1 year ago. The problem has been waxing and waning since onset. The problem is uncontrolled. Associated symptoms include malaise/fatigue. Pertinent negatives include no headaches, peripheral edema or shortness of breath. Risk factors for coronary artery disease include dyslipidemia, obesity and sedentary lifestyle. The current treatment provides mild improvement. There is no history of kidney disease, CAD/MI or heart failure.  Hyperlipidemia  This is a chronic problem. The current episode started more than 1 year ago. The problem is controlled. Recent lipid tests were reviewed and are normal. Exacerbating diseases include obesity. Pertinent negatives include no shortness of breath. Current antihyperlipidemic treatment includes statins and diet change. The current treatment provides moderate improvement of lipids. Risk factors for coronary artery disease include dyslipidemia and a sedentary lifestyle.  Arthritis  Presents for follow-up visit. She complains of pain. The symptoms have been stable. Affected locations include the right knee and left knee. Her pain is at a severity of 8/10.      Review of Systems  Constitutional: Positive for malaise/fatigue.  Respiratory: Negative for shortness of breath.   Musculoskeletal: Positive for arthritis.  Neurological: Negative for headaches.  All other systems reviewed and are negative.      Objective:   Physical Exam  Constitutional: She is oriented to person, place, and time. She appears well-developed and well-nourished. No distress.  HENT:  Head: Normocephalic and atraumatic.  Right Ear: External ear normal.  Left Ear: External ear normal.  Nose: Mucosal edema and rhinorrhea present.  Mouth/Throat: Posterior  oropharyngeal erythema present.  Eyes: Pupils are equal, round, and reactive to light. Left eye exhibits discharge, exudate and hordeolum.  Neck: Normal range of motion. Neck supple. No thyromegaly present.  Cardiovascular: Normal rate, regular rhythm, normal heart sounds and intact distal pulses.  No murmur heard. Pulmonary/Chest: Effort normal and breath sounds normal. No respiratory distress. She has no wheezes.  Abdominal: Soft. Bowel sounds are normal. She exhibits no distension. There is no tenderness.  Musculoskeletal: Normal range of motion. She exhibits no edema or tenderness.  Neurological: She is alert and oriented to person, place, and time. She has normal reflexes. No cranial nerve deficit.  Skin: Skin is warm and dry.  Psychiatric: She has a normal mood and affect. Her behavior is normal. Judgment and thought content normal.  Vitals reviewed.     BP (!) 182/85   Pulse 69   Temp (!) 97.1 F (36.2 C) (Oral)   Ht '4\' 9"'$  (1.448 m)   Wt 138 lb 12.8 oz (63 kg)   BMI 30.04 kg/m      Assessment & Plan:  1. Essential hypertension - CMP14+EGFR  2. Arthritis of knee, left - CMP14+EGFR - meloxicam (MOBIC) 7.5 MG tablet; Take 1 tablet (7.5 mg total) by mouth daily.  Dispense: 90 tablet; Refill: 1  3. Primary osteoarthritis of both knees - CMP14+EGFR - meloxicam (MOBIC) 7.5 MG tablet; Take 1 tablet (7.5 mg total) by mouth daily.  Dispense: 90 tablet; Refill: 1  4. Mixed hyperlipidemia - CMP14+EGFR  5. Single kidney - CMP14+EGFR  6. Obesity (BMI 30-39.9) - CMP14+EGFR   7. Hordeolum externum of left upper eyelid Bacitracin ointment Prescription sent to pharmacy  Warm compresses   Continue all meds Labs pending Health  Maintenance reviewed Diet and exercise encouraged RTO 6 months  Evelina Dun, FNP

## 2018-01-09 LAB — CMP14+EGFR
A/G RATIO: 1.4 (ref 1.2–2.2)
ALK PHOS: 64 IU/L (ref 39–117)
ALT: 15 IU/L (ref 0–32)
AST: 20 IU/L (ref 0–40)
Albumin: 3.9 g/dL (ref 3.2–4.6)
BUN/Creatinine Ratio: 25 (ref 12–28)
BUN: 24 mg/dL (ref 10–36)
Bilirubin Total: 0.6 mg/dL (ref 0.0–1.2)
CALCIUM: 9.6 mg/dL (ref 8.7–10.3)
CHLORIDE: 101 mmol/L (ref 96–106)
CO2: 30 mmol/L — ABNORMAL HIGH (ref 20–29)
Creatinine, Ser: 0.97 mg/dL (ref 0.57–1.00)
GFR calc Af Amer: 58 mL/min/{1.73_m2} — ABNORMAL LOW (ref >59)
GFR, EST NON AFRICAN AMERICAN: 50 mL/min/{1.73_m2} — AB (ref >59)
Globulin, Total: 2.8 g/dL (ref 1.5–4.5)
Glucose: 90 mg/dL (ref 65–99)
POTASSIUM: 4.4 mmol/L (ref 3.5–5.2)
Sodium: 142 mmol/L (ref 134–144)
Total Protein: 6.7 g/dL (ref 6.0–8.5)

## 2018-01-28 DIAGNOSIS — M9903 Segmental and somatic dysfunction of lumbar region: Secondary | ICD-10-CM | POA: Diagnosis not present

## 2018-01-28 DIAGNOSIS — M9904 Segmental and somatic dysfunction of sacral region: Secondary | ICD-10-CM | POA: Diagnosis not present

## 2018-01-28 DIAGNOSIS — M9905 Segmental and somatic dysfunction of pelvic region: Secondary | ICD-10-CM | POA: Diagnosis not present

## 2018-01-28 DIAGNOSIS — M5137 Other intervertebral disc degeneration, lumbosacral region: Secondary | ICD-10-CM | POA: Diagnosis not present

## 2018-02-13 ENCOUNTER — Telehealth: Payer: Self-pay | Admitting: Family

## 2018-02-13 NOTE — Telephone Encounter (Signed)
Aware.  Call her orthopedic doctor and see if an office visit will be required for insurance to pay for a new knee brace.

## 2018-02-15 DIAGNOSIS — M170 Bilateral primary osteoarthritis of knee: Secondary | ICD-10-CM | POA: Diagnosis not present

## 2018-02-15 DIAGNOSIS — M1712 Unilateral primary osteoarthritis, left knee: Secondary | ICD-10-CM | POA: Diagnosis not present

## 2018-02-27 DIAGNOSIS — M5137 Other intervertebral disc degeneration, lumbosacral region: Secondary | ICD-10-CM | POA: Diagnosis not present

## 2018-02-27 DIAGNOSIS — M9904 Segmental and somatic dysfunction of sacral region: Secondary | ICD-10-CM | POA: Diagnosis not present

## 2018-02-27 DIAGNOSIS — M9905 Segmental and somatic dysfunction of pelvic region: Secondary | ICD-10-CM | POA: Diagnosis not present

## 2018-02-27 DIAGNOSIS — M9903 Segmental and somatic dysfunction of lumbar region: Secondary | ICD-10-CM | POA: Diagnosis not present

## 2018-02-28 ENCOUNTER — Encounter: Payer: Self-pay | Admitting: *Deleted

## 2018-03-15 ENCOUNTER — Encounter: Payer: Self-pay | Admitting: Family Medicine

## 2018-03-15 ENCOUNTER — Ambulatory Visit (INDEPENDENT_AMBULATORY_CARE_PROVIDER_SITE_OTHER): Payer: Medicare Other | Admitting: Family Medicine

## 2018-03-15 VITALS — BP 161/80 | HR 67 | Temp 97.4°F | Ht <= 58 in | Wt 134.5 lb

## 2018-03-15 DIAGNOSIS — L89892 Pressure ulcer of other site, stage 2: Secondary | ICD-10-CM | POA: Diagnosis not present

## 2018-03-15 MED ORDER — MUPIROCIN CALCIUM 2 % EX CREA: 1.0000 "application " | TOPICAL_CREAM | Freq: Two times a day (BID) | CUTANEOUS | 0 refills | Status: AC

## 2018-03-15 NOTE — Progress Notes (Signed)
Subjective: CC: sore on toe PCP: Sharion Balloon, FNP PYP:PJKD Paige Ferguson is a 85 y.o. female presenting to clinic today for:  1. Sore on toe Patient is brought in by her daughter who notes that they noticed a sore on her middle left toe a few days ago.  Patient reports that lesion started out as a blister that was fluid-filled.  Her son had been caring for the toe with frequent soaks and fresh bandages.  She notes that the bubble popped and was leaking fluid for a couple of days.  During which time, the lesion was tender to touch.  She notes that over the last couple of days, the areas turned into a white dot on her toe and is no longer tender.  She wanted to make sure that there was no infection.  At baseline, she wears compression hose and supportive footwear.  ROS: Per HPI  Allergies  Allergen Reactions  . Sulfa Antibiotics Nausea And Vomiting  . Amoxicillin   . Nitrofurantoin Monohyd Macro    Past Medical History:  Diagnosis Date  . Allergy   . Arthritis of knee   . Cataract   . Cholelithiases   . Chronic kidney disease   . Endometrial polyp 7/98   2 degree polyps   . Genu valgum   . Gout   . Hyperlipidemia   . Hypertension   . Left knee pain   . Osteoarthritis   . Osteoporosis   . Postmenopausal bleeding   . Recurrent UTI   . Renal abscess, right   . Shoulder fracture, left   . TIA (transient ischemic attack)   . Ureteral calculi   . URI (upper respiratory infection)     Current Outpatient Medications:  .  amLODipine (NORVASC) 5 MG tablet, Take 1 tablet (5 mg total) by mouth daily., Disp: 90 tablet, Rfl: 4 .  benazepril (LOTENSIN) 40 MG tablet, Take 1 tablet (40 mg total) by mouth daily., Disp: 90 tablet, Rfl: 4 .  Calcium-Magnesium-Vitamin D (CALCIUM 1200+D3 PO), Take by mouth., Disp: , Rfl:  .  carvedilol (COREG) 6.25 MG tablet, TAKE  (1)  TABLET TWICE A DAY WITH MEALS (BREAKFAST AND SUPPER), Disp: 180 tablet, Rfl: 3 .  fexofenadine (ALLEGRA) 30 MG tablet,  Take 1 tablet (30 mg total) by mouth daily. Over the counter, Disp: 90 tablet, Rfl: 3 .  furosemide (LASIX) 40 MG tablet, Taken one and half tablet by muoth daily, Disp: 135 tablet, Rfl: 4 .  HYDROcodone-homatropine (HYCODAN) 5-1.5 MG/5ML syrup, Take 5 mLs by mouth every 8 (eight) hours as needed for cough., Disp: 120 mL, Rfl: 0 .  meloxicam (MOBIC) 7.5 MG tablet, Take 1 tablet (7.5 mg total) by mouth daily., Disp: 90 tablet, Rfl: 1 .  Misc Natural Products (GNP GLUCOSAME CHONDROITIN DS) TABS, Take by mouth., Disp: , Rfl:  .  Misc. Devices (STEP N REST II WALKER) MISC, by Does not apply route daily. PT HAS RX. FOR ROLLING WALKER WITH SEAT AND HANDBRAKES . , Disp: , Rfl:  .  Multiple Vitamins-Minerals (CENTRUM SILVER ADULT 50+) TABS, Take 1 tablet by mouth daily., Disp: , Rfl:  .  Omega-3 Fatty Acids (FISH OIL) 1000 MG CAPS, Take 1 capsule by mouth 2 (two) times daily., Disp: , Rfl:  .  raloxifene (EVISTA) 60 MG tablet, TAKE 1 TABLET DAILY, Disp: 90 tablet, Rfl: 4 .  vitamin E 200 UNIT capsule, Take 200 Units by mouth daily., Disp: , Rfl:  .  mupirocin cream (  BACTROBAN) 2 %, Apply 1 application topically 2 (two) times daily for 7 days., Disp: 15 g, Rfl: 0 Social History   Socioeconomic History  . Marital status: Widowed    Spouse name: Not on file  . Number of children: Not on file  . Years of education: Not on file  . Highest education level: Not on file  Occupational History  . Not on file  Social Needs  . Financial resource strain: Not on file  . Food insecurity:    Worry: Not on file    Inability: Not on file  . Transportation needs:    Medical: Not on file    Non-medical: Not on file  Tobacco Use  . Smoking status: Former Smoker    Types: Cigarettes    Last attempt to quit: 11/20/1944    Years since quitting: 73.3  . Smokeless tobacco: Never Used  Substance and Sexual Activity  . Alcohol use: No  . Drug use: No  . Sexual activity: Never  Lifestyle  . Physical activity:     Days per week: Not on file    Minutes per session: Not on file  . Stress: Not on file  Relationships  . Social connections:    Talks on phone: Not on file    Gets together: Not on file    Attends religious service: Not on file    Active member of club or organization: Not on file    Attends meetings of clubs or organizations: Not on file    Relationship status: Not on file  . Intimate partner violence:    Fear of current or ex partner: Not on file    Emotionally abused: Not on file    Physically abused: Not on file    Forced sexual activity: Not on file  Other Topics Concern  . Not on file  Social History Narrative   WIDOWED   3 CHILDREN 1 DECEASED   Family History  Problem Relation Age of Onset  . Stroke Maternal Grandmother   . Hypertension Maternal Grandmother   . Diabetes Maternal Grandmother   . Pneumonia Father   . Coronary artery disease Paternal Grandfather   . Heart defect Son     Objective: Office vital signs reviewed. BP (!) 161/80   Pulse 67   Temp (!) 97.4 F (36.3 C) (Oral)   Ht 4\' 9"  (1.448 m)   Wt 134 lb 8 oz (61 kg)   BMI 29.11 kg/m   Physical Examination:  General: Awake, alert, well appearing elderly female, No acute distress Skin: Small, healing ulceration along the medial aspect of the DIP joint of the left middle toe.  There is no palpable exudate, induration or increased warmth over area.  She has no joint involvement.  There are several bruises and areas of erythema throughout the foot that appear to be pressure ulcers, stage I.  Assessment/ Plan: 84 y.o. female   1. Pressure injury of toe of left foot, stage 2 This appears to be a healing stage II pressure ulcer of the left middle toe.  There is no evidence of infection at this time.  However, we will cover with topical Bactroban empirically.  Home care instructions were reviewed with the patient.  Reasons for return and emergent evaluation in the emergency department discussed.  Both she  and her daughter voiced good understanding and will follow-up as needed.    Meds ordered this encounter  Medications  . mupirocin cream (BACTROBAN) 2 %  Sig: Apply 1 application topically 2 (two) times daily for 7 days.    Dispense:  15 g    Refill:  [place], DO [place] [phone]

## 2018-03-15 NOTE — Patient Instructions (Signed)
Keep wound clean with soap and water.  Apply the topical antibiotic twice a day along with a fresh bandage.  Follow-up if you develop any other worrisome symptoms or signs we discussed.  Pressure Injury A pressure injury, sometimes called a bedsore, is an injury to the skin and underlying tissue caused by pressure. Pressure on blood vessels causes decreased blood flow to the skin, which can eventually cause the skin tissue to die and break down into a wound. Pressure injuries usually occur:  Over bony parts of the body such as the tailbone, shoulders, elbows, hips, and heels.  Under medical devices such as respiratory equipment, stockings, tubes, and splints.  Pressure injuries start as reddened areas on the skin and can lead to pain, muscle damage, and infection. Pressure injuries can vary in severity. What are the causes? Pressure injuries are caused by a lack of blood supply to an area of skin. They can occur from intense pressure over a short period of time or from less intense pressure over a long period of time. What increases the risk? This condition is more likely to develop in people who:  Are in the hospital or an extended care facility.  Are bedridden or in a wheelchair.  Have an injury or disease that keeps them from: ? Moving normally. ? Feeling pain or pressure.  Have a condition that: ? Makes them sleepy or less alert. ? Causes poor blood flow.  Need to wear a medical device.  Have poor control of their bladder or bowel functions (incontinence).  Have poor nutrition (malnutrition).  Are of certain ethnicities. People of African American and Latino or Hispanic descent are at higher risk compared to other ethnic groups.  If you are at risk for pressure ulcers, your health care provider may recommend certain types of bedding to help prevent them. These may include foam or gel mattresses covered with one of the following:  A sheepskin blanket.  A pad that is filled  with gel, air, water, or foam.  What are the signs or symptoms? The main symptom is a blister or change in skin color that opens into a wound. Other symptoms include:  Red or dark areas of skin that do not turn white or pale when pressed with a finger.  Pain, warmth, or change of skin texture.  How is this diagnosed? This condition is diagnosed with a medical history and physical exam. You may also have tests, including:  Blood tests to check for infection or signs of poor nutrition.  Imaging studies to check for damage to the deep tissues under your skin.  Blood flow studies.  Your pressure injury will be staged to determine its severity. Staging is an assessment of:  The depth of the pressure injury.  Which tissues are exposed because of the pressure injury.  The causes of the pressure injury.  How is this treated? The main focus of treatment is to help your injury heal. This may be done by:  Relieving or redistributing pressure on your skin. This includes: ? Frequently changing your position. ? Eliminating or minimizing positions that caused the wound or that can make the wound worse. ? Using specific bed mattresses and chair cushions. ? Refitting, resizing, or replacing any medical devices, or padding the skin under them. ? Using creams or powders to prevent rubbing (friction) on the skin.  Keeping your skin clean and dry. This may include using a skin cleanser or skin protectant as told by your health care provider.  This may be a lotion, ointment, or spray.  Cleaning your injury and removing any dead tissue from the wound (debridement).  Placing a bandage (dressing) over your injury.  Preventing or treating infection. This may include antibiotic, antimicrobial, or antiseptic medicines.  Treatment may also include medicine for pain. Sometimes surgery is needed to close the wound with a flap of healthy skin or a piece of skin from another area of your body (graft). You  may need surgery if other treatments are not working or if your injury is very deep. Follow these instructions at home: Wound care  Follow instructions from your health care provider about: ? How to take care of your wound. ? When and how you should change your dressing. ? When you should remove your dressing. If your dressing is dry and stuck when you try to remove it, moisten or wet the dressing with saline or water so that it can be removed without harming your skin or wound tissue.  Check your wound every day for signs of infection. Have a caregiver do this for you if you are not able. Watch for: ? More redness, swelling, or pain. ? More fluid, blood, or pus. ? A bad smell. Skin Care  Keep your skin clean and dry. Gently pat your skin dry.  Do not rub or massage your skin.  Use a skin protectant only as told by your health care provider.  Check your skin every day for any changes in color or any new blisters or sores (ulcers). Have a caregiver do this for you if you are not able. Medicines  Take over-the-counter and prescription medicines only as told by your health care provider.  If you were prescribed an antibiotic medicine, take it or apply it as told by your health care provider. Do not stop taking or using the antibiotic even if your condition improves. Reducing and Redistributing Pressure  Do not lie or sit in one position for a long time. Move or change position every two hours or as told by your health care provider.  Use pillows or cushions to reduce pressure. Ask your health care provider to recommend cushions or pads for you.  Use medical devices that do not rub your skin. Tell your health care provider if one of your medical devices is causing a pressure injury to develop. General instructions   Eat a healthy diet that includes lots of protein. Ask your health care provider for diet advice.  Drink enough fluid to keep your urine clear or pale yellow.  Be as  active as you can every day. Ask your health care provider to suggest safe exercises or activities.  Do not abuse drugs or alcohol.  Keep all follow-up visits as told by your health care provider. This is important.  Do not smoke. Contact a health care provider if:   You have chills or fever.  Your pain medicine is not helping.  You have any changes in skin color.  You have new blisters or sores.  You develop warmth, redness, or swelling near a pressure injury.  You have a bad odor or pus coming from your pressure injury.  You lose control of your bowels or bladder.  You develop new symptoms.  Your wound does not improve after 1-2 weeks of treatment.  You develop a new medical condition, such as diabetes, peripheral vascular disease, or conditions that affect your defense (immune) system. This information is not intended to replace advice given to you by your  health care provider. Make sure you discuss any questions you have with your health care provider. Document Released: 11/06/2005 Document Revised: 04/10/2016 Document Reviewed: 03/17/2015 Elsevier Interactive Patient Education  [employer].

## 2018-03-27 DIAGNOSIS — M5137 Other intervertebral disc degeneration, lumbosacral region: Secondary | ICD-10-CM | POA: Diagnosis not present

## 2018-03-27 DIAGNOSIS — M9904 Segmental and somatic dysfunction of sacral region: Secondary | ICD-10-CM | POA: Diagnosis not present

## 2018-03-27 DIAGNOSIS — M9903 Segmental and somatic dysfunction of lumbar region: Secondary | ICD-10-CM | POA: Diagnosis not present

## 2018-03-27 DIAGNOSIS — M9905 Segmental and somatic dysfunction of pelvic region: Secondary | ICD-10-CM | POA: Diagnosis not present

## 2018-04-01 ENCOUNTER — Encounter: Payer: Self-pay | Admitting: Physician Assistant

## 2018-04-01 ENCOUNTER — Ambulatory Visit (INDEPENDENT_AMBULATORY_CARE_PROVIDER_SITE_OTHER): Payer: Medicare Other | Admitting: Physician Assistant

## 2018-04-01 VITALS — BP 114/71 | HR 92 | Temp 98.4°F | Ht <= 58 in | Wt 134.0 lb

## 2018-04-01 DIAGNOSIS — L89891 Pressure ulcer of other site, stage 1: Secondary | ICD-10-CM

## 2018-04-01 MED ORDER — CEPHALEXIN 250 MG PO CAPS: 500.0000 mg | ORAL_CAPSULE | Freq: Four times a day (QID) | ORAL | 0 refills | Status: DC

## 2018-04-02 DIAGNOSIS — L89891 Pressure ulcer of other site, stage 1: Secondary | ICD-10-CM | POA: Insufficient documentation

## 2018-04-02 NOTE — Progress Notes (Signed)
BP 114/71 (BP Location: Left Arm)   Pulse 92   Temp 98.4 F (36.9 C) (Oral)   Ht 4' 9" (1.448 m)   Wt 134 lb (60.8 kg)   BMI 29.00 kg/m    Subjective:    Patient ID: Paige Ferguson, female    DOB: Mar 30, 1921, 84 y.o.   MRN: [id number]  HPI: Paige Ferguson is a 84 y.o. female presenting on 04/01/2018 for sore on left 2nd toe Patient comes in for recheck of the sore on her toe.  Is not gotten much better.  They have tried topical only.  They are doing very good wound care and taking care of it.  With the chronicity of this I have talked to them about going to podiatry for possible molded shoes.   Past Medical History:  Diagnosis Date  . Allergy   . Arthritis of knee   . Cataract   . Cholelithiases   . Chronic kidney disease   . Endometrial polyp 7/98   2 degree polyps   . Genu valgum   . Gout   . Hyperlipidemia   . Hypertension   . Left knee pain   . Osteoarthritis   . Osteoporosis   . Postmenopausal bleeding   . Recurrent UTI   . Renal abscess, right   . Shoulder fracture, left   . TIA (transient ischemic attack)   . Ureteral calculi   . URI (upper respiratory infection)    Relevant past medical, surgical, family and social history reviewed and updated as indicated. Interim medical history since our last visit reviewed. Allergies and medications reviewed and updated. DATA REVIEWED: CHART IN EPIC  Family History reviewed for pertinent findings.  Review of Systems  Constitutional: Negative.   HENT: Negative.   Eyes: Negative.   Respiratory: Negative.   Gastrointestinal: Negative.   Genitourinary: Negative.   Skin: Positive for color change and wound.    Allergies as of 04/01/2018      Reactions   Sulfa Antibiotics Nausea And Vomiting   Amoxicillin    Nitrofurantoin Monohyd Macro       Medication List        Accurate as of 04/01/18 11:59 PM. Always use your most recent med list.          amLODipine 5 MG tablet Commonly known as:  NORVASC Take 1  tablet (5 mg total) by mouth daily.   benazepril 40 MG tablet Commonly known as:  LOTENSIN Take 1 tablet (40 mg total) by mouth daily.   CALCIUM 1200+D3 PO Take by mouth.   carvedilol 6.25 MG tablet Commonly known as:  COREG TAKE  (1)  TABLET TWICE A DAY WITH MEALS (BREAKFAST AND SUPPER)   CENTRUM SILVER ADULT 50+ Tabs Take 1 tablet by mouth daily.   cephALEXin 250 MG capsule Commonly known as:  KEFLEX Take 2 capsules (500 mg total) by mouth 4 (four) times daily.   Fish Oil 1000 MG Caps Take 1 capsule by mouth 2 (two) times daily.   furosemide 40 MG tablet Commonly known as:  LASIX Taken one and half tablet by muoth daily   GNP GLUCOSAME CHONDROITIN DS Tabs Take by mouth.   HYDROcodone-homatropine 5-1.5 MG/5ML syrup Commonly known as:  HYCODAN Take 5 mLs by mouth every 8 (eight) hours as needed for cough.   meloxicam 7.5 MG tablet Commonly known as:  MOBIC Take 1 tablet (7.5 mg total) by mouth daily.   raloxifene 60 MG tablet Commonly known as:  EVISTA TAKE 1 TABLET DAILY   STEP N REST II WALKER Misc by Does not apply route daily. PT HAS RX. FOR ROLLING WALKER WITH SEAT AND HANDBRAKES .   vitamin E 200 UNIT capsule Take 200 Units by mouth daily.          Objective:    BP 114/71 (BP Location: Left Arm)   Pulse 92   Temp 98.4 F (36.9 C) (Oral)   Ht 4' 9" (1.448 m)   Wt 134 lb (60.8 kg)   BMI 29.00 kg/m   Allergies  Allergen Reactions  . Sulfa Antibiotics Nausea And Vomiting  . Amoxicillin   . Nitrofurantoin Monohyd Macro     Wt Readings from Last 3 Encounters:  04/01/18 134 lb (60.8 kg)  03/15/18 134 lb 8 oz (61 kg)  01/08/18 138 lb 12.8 oz (63 kg)    Physical Exam  Constitutional: She is oriented to person, place, and time. She appears well-developed and well-nourished.  HENT:  Head: Normocephalic and atraumatic.  Eyes: Pupils are equal, round, and reactive to light. Conjunctivae and EOM are normal.  Cardiovascular: Normal rate, regular  rhythm, normal heart sounds and intact distal pulses.  Pulmonary/Chest: Effort normal and breath sounds normal.  Abdominal: Soft. Bowel sounds are normal.  Neurological: She is alert and oriented to person, place, and time. She has normal reflexes.  Skin: Skin is warm and dry. No rash noted.  On the second toe of the left foot there is some deformity and at the end of the toe there is still some skin color change that has developed a papule slightly.  There is no surrounding erythema.  There is no breakdown the skin in between the toes.  She does have bruising spots on the lateral portions of the foot and the medial portion.  Psychiatric: She has a normal mood and affect. Her behavior is normal. Judgment and thought content normal.    Results for orders placed or performed in visit on 01/08/18  CMP14+EGFR  Result Value Ref Range   Glucose 90 65 - 99 mg/dL   BUN 24 10 - 36 mg/dL   Creatinine, Ser 0.97 0.57 - 1.00 mg/dL   GFR calc non Af Amer 50 (L) >59 mL/min/1.73   GFR calc Af Amer 58 (L) >59 mL/min/1.73   BUN/Creatinine Ratio 25 12 - 28   Sodium 142 134 - 144 mmol/L   Potassium 4.4 3.5 - 5.2 mmol/L   Chloride 101 96 - 106 mmol/L   CO2 30 (H) 20 - 29 mmol/L   Calcium 9.6 8.7 - 10.3 mg/dL   Total Protein 6.7 6.0 - 8.5 g/dL   Albumin 3.9 3.2 - 4.6 g/dL   Globulin, Total 2.8 1.5 - 4.5 g/dL   Albumin/Globulin Ratio 1.4 1.2 - 2.2   Bilirubin Total 0.6 0.0 - 1.2 mg/dL   Alkaline Phosphatase 64 39 - 117 IU/L   AST 20 0 - 40 IU/L   ALT 15 0 - 32 IU/L      Assessment & Plan:   1. Pressure injury of toe of left foot, stage 1 - cephALEXin (KEFLEX) 250 MG capsule; Take 2 capsules (500 mg total) by mouth 4 (four) times daily.  Dispense: 30 capsule; Refill: 0 - Ambulatory referral to Podiatry   Continue all other maintenance medications as listed above.  Follow up plan: No follow-ups on file.  Educational handout given for [place] PA-C [place] [address]  [place], [place] [address] [phone]   04/02/2018, 9:42 AM

## 2018-04-11 DIAGNOSIS — L03122 Acute lymphangitis of left axilla: Secondary | ICD-10-CM | POA: Diagnosis not present

## 2018-05-09 DIAGNOSIS — L03122 Acute lymphangitis of left axilla: Secondary | ICD-10-CM | POA: Diagnosis not present

## 2018-05-09 DIAGNOSIS — M10072 Idiopathic gout, left ankle and foot: Secondary | ICD-10-CM | POA: Diagnosis not present

## 2018-06-19 DIAGNOSIS — M1712 Unilateral primary osteoarthritis, left knee: Secondary | ICD-10-CM | POA: Diagnosis not present

## 2018-06-27 DIAGNOSIS — M1712 Unilateral primary osteoarthritis, left knee: Secondary | ICD-10-CM | POA: Diagnosis not present

## 2018-07-01 ENCOUNTER — Encounter: Payer: Self-pay | Admitting: Family

## 2018-07-01 ENCOUNTER — Ambulatory Visit (INDEPENDENT_AMBULATORY_CARE_PROVIDER_SITE_OTHER): Payer: Medicare Other | Admitting: Family

## 2018-07-01 VITALS — BP 170/82 | HR 69 | Temp 97.9°F | Ht <= 58 in | Wt 142.8 lb

## 2018-07-01 DIAGNOSIS — I10 Essential (primary) hypertension: Secondary | ICD-10-CM | POA: Diagnosis not present

## 2018-07-01 DIAGNOSIS — E782 Mixed hyperlipidemia: Secondary | ICD-10-CM | POA: Diagnosis not present

## 2018-07-01 DIAGNOSIS — M170 Bilateral primary osteoarthritis of knee: Secondary | ICD-10-CM | POA: Diagnosis not present

## 2018-07-01 DIAGNOSIS — M1712 Unilateral primary osteoarthritis, left knee: Secondary | ICD-10-CM

## 2018-07-01 DIAGNOSIS — H6121 Impacted cerumen, right ear: Secondary | ICD-10-CM

## 2018-07-01 DIAGNOSIS — E669 Obesity, unspecified: Secondary | ICD-10-CM

## 2018-07-01 NOTE — Patient Instructions (Signed)

## 2018-07-01 NOTE — Progress Notes (Addendum)
Subjective:    Patient ID: Paige Ferguson, female    DOB: 10-19-1921, 84 y.o.   MRN: [id number]  Chief Complaint  Patient presents with  . Medical Management of Chronic Issues    six month recheck   Pt presents to the office today for chronic follow up. PT is followed by Ortho for osteoarthritis in left knee. States she is getting gel injections and states this has greatly helped her pain.  Hypertension  This is a chronic problem. The current episode started more than 1 year ago. The problem has been waxing and waning since onset. The problem is uncontrolled. Associated symptoms include malaise/fatigue and peripheral edema. Pertinent negatives include no shortness of breath. Risk factors for coronary artery disease include dyslipidemia and obesity. The current treatment provides moderate improvement. Hypertensive end-organ damage includes kidney disease. There is no history of heart failure.  Arthritis  Presents for follow-up visit. She complains of pain and stiffness. The symptoms have been stable. Affected locations include the right knee and left knee. Her pain is at a severity of 6/10.  Hyperlipidemia  This is a chronic problem. The current episode started more than 1 year ago. The problem is controlled. Recent lipid tests were reviewed and are high. Exacerbating diseases include obesity. Pertinent negatives include no shortness of breath. Current antihyperlipidemic treatment includes exercise and herbal therapy. The current treatment provides moderate improvement of lipids. Risk factors for coronary artery disease include diabetes mellitus and dyslipidemia.  Osteoporosis  Taking Evista daily. Last Dexa scan 11/23/16.    Review of Systems  Constitutional: Positive for malaise/fatigue.  Respiratory: Negative for shortness of breath.   Musculoskeletal: Positive for arthralgias, arthritis and stiffness.  All other systems reviewed and are negative.      Objective:   Physical Exam    Constitutional: She is oriented to person, place, and time. She appears well-developed and well-nourished. No distress.  HENT:  Head: Normocephalic and atraumatic.  Left Ear: External ear normal.  Mouth/Throat: Oropharynx is clear and moist.  Right ear cerumen impaction    Eyes: Pupils are equal, round, and reactive to light.  Neck: Normal range of motion. Neck supple. No thyromegaly present.  Cardiovascular: Normal rate, regular rhythm, normal heart sounds and intact distal pulses.  No murmur heard. Pulmonary/Chest: Effort normal and breath sounds normal. No respiratory distress. She has no wheezes.  Abdominal: Soft. Bowel sounds are normal. She exhibits no distension. There is no tenderness.  Musculoskeletal: She exhibits edema (3+ BLE). She exhibits no tenderness.  Generalized weakness, using rolling walker   Neurological: She is alert and oriented to person, place, and time. She has normal reflexes. No cranial nerve deficit.  Skin: Skin is warm and dry.  Psychiatric: She has a normal mood and affect. Her behavior is normal. Judgment and thought content normal.  Vitals reviewed.   Right ear cerumen present, curette used and TM normal   BP (!) 178/90   Pulse 72   Temp 97.9 F (36.6 C) (Oral)   Ht 4' 9"  (1.448 m)   Wt 142 lb 12.8 oz (64.8 kg)   BMI 30.90 kg/m      Assessment & Plan:  Paige Ferguson comes in today with chief complaint of Medical Management of Chronic Issues (six month recheck)   Diagnosis and orders addressed:  1. Arthritis of knee, left - CMP14+EGFR - CBC with Differential/Platelet  2. Essential hypertension - CMP14+EGFR - CBC with Differential/Platelet  3. Mixed hyperlipidemia - CMP14+EGFR - CBC with  Differential/Platelet  4. Obesity (BMI 30-39.9) - CMP14+EGFR - CBC with Differential/Platelet  5. Primary osteoarthritis of both knees - CMP14+EGFR - CBC with Differential/Platelet  6. Right ear impacted cerumen   Labs pending Health  Maintenance reviewed Diet and exercise encouraged  Follow up plan: 6 months  And keep ortho appt  Evelina Dun, FNP

## 2018-07-02 LAB — CBC WITH DIFFERENTIAL/PLATELET
Basophils Absolute: 0 10*3/uL (ref 0.0–0.2)
Basos: 0 %
EOS (ABSOLUTE): 0.3 10*3/uL (ref 0.0–0.4)
Eos: 3 %
Hematocrit: 39.4 % (ref 34.0–46.6)
Hemoglobin: 13 g/dL (ref 11.1–15.9)
IMMATURE GRANULOCYTES: 0 %
Immature Grans (Abs): 0 10*3/uL (ref 0.0–0.1)
Lymphocytes Absolute: 3 10*3/uL (ref 0.7–3.1)
Lymphs: 41 %
MCH: 32.9 pg (ref 26.6–33.0)
MCHC: 33 g/dL (ref 31.5–35.7)
MCV: 100 fL — AB (ref 79–97)
MONOS ABS: 1 10*3/uL — AB (ref 0.1–0.9)
Monocytes: 14 %
NEUTROS PCT: 42 %
Neutrophils Absolute: 3.1 10*3/uL (ref 1.4–7.0)
PLATELETS: 252 10*3/uL (ref 150–450)
RBC: 3.95 x10E6/uL (ref 3.77–5.28)
RDW: 13.8 % (ref 12.3–15.4)
WBC: 7.4 10*3/uL (ref 3.4–10.8)

## 2018-07-02 LAB — CMP14+EGFR
A/G RATIO: 1.7 (ref 1.2–2.2)
ALK PHOS: 57 IU/L (ref 39–117)
ALT: 10 IU/L (ref 0–32)
AST: 24 IU/L (ref 0–40)
Albumin: 4.2 g/dL (ref 3.2–4.6)
BUN/Creatinine Ratio: 28 (ref 12–28)
BUN: 27 mg/dL (ref 10–36)
Bilirubin Total: 0.7 mg/dL (ref 0.0–1.2)
CO2: 27 mmol/L (ref 20–29)
Calcium: 9.6 mg/dL (ref 8.7–10.3)
Chloride: 103 mmol/L (ref 96–106)
Creatinine, Ser: 0.96 mg/dL (ref 0.57–1.00)
GFR calc Af Amer: 58 mL/min/{1.73_m2} — ABNORMAL LOW (ref >59)
GFR calc non Af Amer: 50 mL/min/{1.73_m2} — ABNORMAL LOW (ref >59)
GLOBULIN, TOTAL: 2.5 g/dL (ref 1.5–4.5)
Glucose: 91 mg/dL (ref 65–99)
POTASSIUM: 4.3 mmol/L (ref 3.5–5.2)
SODIUM: 143 mmol/L (ref 134–144)
Total Protein: 6.7 g/dL (ref 6.0–8.5)

## 2018-07-05 DIAGNOSIS — M1712 Unilateral primary osteoarthritis, left knee: Secondary | ICD-10-CM | POA: Diagnosis not present

## 2018-07-08 ENCOUNTER — Ambulatory Visit: Payer: Medicare Other | Admitting: Family

## 2018-07-18 ENCOUNTER — Ambulatory Visit (INDEPENDENT_AMBULATORY_CARE_PROVIDER_SITE_OTHER): Payer: Medicare Other | Admitting: Nurse Practitioner

## 2018-07-18 ENCOUNTER — Encounter: Payer: Self-pay | Admitting: Nurse Practitioner

## 2018-07-18 VITALS — BP 143/86 | HR 83 | Temp 97.3°F | Ht <= 58 in | Wt 142.0 lb

## 2018-07-18 DIAGNOSIS — R350 Frequency of micturition: Secondary | ICD-10-CM | POA: Diagnosis not present

## 2018-07-18 DIAGNOSIS — N3000 Acute cystitis without hematuria: Secondary | ICD-10-CM

## 2018-07-18 LAB — URINALYSIS, COMPLETE
BILIRUBIN UA: NEGATIVE
Glucose, UA: NEGATIVE
Leukocytes, UA: NEGATIVE
NITRITE UA: NEGATIVE
PH UA: 7 (ref 5.0–7.5)
PROTEIN UA: NEGATIVE
RBC UA: NEGATIVE
Specific Gravity, UA: 1.015 (ref 1.005–1.030)
UUROB: 0.2 mg/dL (ref 0.2–1.0)

## 2018-07-18 LAB — MICROSCOPIC EXAMINATION
BACTERIA UA: NONE SEEN
RBC MICROSCOPIC, UA: NONE SEEN /HPF (ref 0–2)
RENAL EPITHEL UA: NONE SEEN /HPF

## 2018-07-18 MED ORDER — CIPROFLOXACIN HCL 500 MG PO TABS: 500.0000 mg | ORAL_TABLET | Freq: Two times a day (BID) | ORAL | 0 refills | Status: DC

## 2018-07-18 NOTE — Patient Instructions (Signed)

## 2018-07-18 NOTE — Progress Notes (Signed)
   Subjective:    Patient ID: Paige Ferguson, female    DOB: [birth year], 84 y.o.   MRN: [id number]   Chief Complaint: dysuria  HPI Patient is brought in today by her daughter. She is c/o frequency and urgency. She has low suprapubic pain.   Review of Systems  Constitutional: Negative.   HENT: Negative.   Respiratory: Negative.   Genitourinary: Positive for frequency, pelvic pain and urgency. Negative for dysuria.  Neurological: Negative.   Psychiatric/Behavioral: Negative.   All other systems reviewed and are negative.      Objective:   Physical Exam  Constitutional: She is oriented to person, place, and time. She appears well-developed and well-nourished. No distress.  Cardiovascular: Normal rate.  Pulmonary/Chest: Effort normal.  Abdominal: Soft. Bowel sounds are normal. There is tenderness (mild supra pubic pain on palaption).  Neurological: She is alert and oriented to person, place, and time.  Skin: Skin is warm.  Psychiatric: She has a normal mood and affect. Her behavior is normal. Thought content normal.   BP (!) 143/86   Pulse 83   Temp (!) 97.3 F (36.3 C) (Oral)   Ht 4\' 9"  (1.448 m)   Wt 142 lb (64.4 kg)   BMI 30.73 kg/m         Assessment & Plan:  Paige Ferguson in today with chief complaint of Dysuria   1. Frequent urination - Urinalysis, Complete  2. Acute cystitis without hematuria Take medication as prescribe Cotton underwear Take shower not bath Cranberry juice, yogurt Force fluids AZO over the counter X2 days Culture pending RTO prn - Urine Culture Meds ordered this encounter  Medications  . ciprofloxacin (CIPRO) 500 MG tablet    Sig: Take 1 tablet (500 mg total) by mouth 2 (two) times daily.    Dispense:  10 tablet    Refill:  0    Order Specific Question:   Supervising Provider    Answer:   Eustaquio Maize [[id number]]   Tamber-Margaret Hassell Done, FNP

## 2018-07-21 LAB — URINE CULTURE

## 2018-07-29 ENCOUNTER — Other Ambulatory Visit: Payer: Self-pay | Admitting: Family

## 2018-07-29 DIAGNOSIS — M810 Age-related osteoporosis without current pathological fracture: Secondary | ICD-10-CM

## 2018-08-15 ENCOUNTER — Other Ambulatory Visit: Payer: Medicare Other

## 2018-08-15 DIAGNOSIS — M1712 Unilateral primary osteoarthritis, left knee: Secondary | ICD-10-CM | POA: Diagnosis not present

## 2018-08-22 ENCOUNTER — Encounter: Payer: Medicare Other | Admitting: *Deleted

## 2018-08-29 ENCOUNTER — Other Ambulatory Visit: Payer: Self-pay | Admitting: Family

## 2018-08-29 DIAGNOSIS — I10 Essential (primary) hypertension: Secondary | ICD-10-CM

## 2018-09-05 ENCOUNTER — Ambulatory Visit (INDEPENDENT_AMBULATORY_CARE_PROVIDER_SITE_OTHER): Payer: Medicare Other

## 2018-09-05 DIAGNOSIS — Z23 Encounter for immunization: Secondary | ICD-10-CM

## 2018-09-20 ENCOUNTER — Other Ambulatory Visit: Payer: Self-pay | Admitting: Family

## 2018-09-20 DIAGNOSIS — I10 Essential (primary) hypertension: Secondary | ICD-10-CM

## 2018-09-20 DIAGNOSIS — M170 Bilateral primary osteoarthritis of knee: Secondary | ICD-10-CM

## 2018-09-20 DIAGNOSIS — M1712 Unilateral primary osteoarthritis, left knee: Secondary | ICD-10-CM

## 2018-09-25 DIAGNOSIS — M9904 Segmental and somatic dysfunction of sacral region: Secondary | ICD-10-CM | POA: Diagnosis not present

## 2018-09-25 DIAGNOSIS — M5137 Other intervertebral disc degeneration, lumbosacral region: Secondary | ICD-10-CM | POA: Diagnosis not present

## 2018-09-25 DIAGNOSIS — M9905 Segmental and somatic dysfunction of pelvic region: Secondary | ICD-10-CM | POA: Diagnosis not present

## 2018-09-25 DIAGNOSIS — M9903 Segmental and somatic dysfunction of lumbar region: Secondary | ICD-10-CM | POA: Diagnosis not present

## 2018-10-03 ENCOUNTER — Other Ambulatory Visit: Payer: Self-pay | Admitting: Family

## 2018-10-03 DIAGNOSIS — I10 Essential (primary) hypertension: Secondary | ICD-10-CM

## 2018-10-22 ENCOUNTER — Other Ambulatory Visit: Payer: Self-pay | Admitting: Family

## 2018-10-22 DIAGNOSIS — I10 Essential (primary) hypertension: Secondary | ICD-10-CM

## 2018-10-23 DIAGNOSIS — M5137 Other intervertebral disc degeneration, lumbosacral region: Secondary | ICD-10-CM | POA: Diagnosis not present

## 2018-10-23 DIAGNOSIS — M9903 Segmental and somatic dysfunction of lumbar region: Secondary | ICD-10-CM | POA: Diagnosis not present

## 2018-10-23 DIAGNOSIS — M9905 Segmental and somatic dysfunction of pelvic region: Secondary | ICD-10-CM | POA: Diagnosis not present

## 2018-10-23 DIAGNOSIS — M9904 Segmental and somatic dysfunction of sacral region: Secondary | ICD-10-CM | POA: Diagnosis not present

## 2018-10-28 ENCOUNTER — Encounter: Payer: Medicare Other | Admitting: *Deleted

## 2018-10-29 ENCOUNTER — Encounter: Payer: Self-pay | Admitting: Family

## 2018-10-29 ENCOUNTER — Ambulatory Visit (INDEPENDENT_AMBULATORY_CARE_PROVIDER_SITE_OTHER): Payer: Medicare Other | Admitting: Family

## 2018-10-29 VITALS — BP 132/87 | HR 96 | Temp 97.2°F | Wt 141.0 lb

## 2018-10-29 DIAGNOSIS — B9689 Other specified bacterial agents as the cause of diseases classified elsewhere: Secondary | ICD-10-CM | POA: Diagnosis not present

## 2018-10-29 DIAGNOSIS — M1712 Unilateral primary osteoarthritis, left knee: Secondary | ICD-10-CM | POA: Diagnosis not present

## 2018-10-29 DIAGNOSIS — M170 Bilateral primary osteoarthritis of knee: Secondary | ICD-10-CM | POA: Diagnosis not present

## 2018-10-29 DIAGNOSIS — J208 Acute bronchitis due to other specified organisms: Secondary | ICD-10-CM

## 2018-10-29 DIAGNOSIS — I10 Essential (primary) hypertension: Secondary | ICD-10-CM

## 2018-10-29 MED ORDER — AZITHROMYCIN 250 MG PO TABS: ORAL_TABLET | ORAL | 0 refills | Status: DC

## 2018-10-29 MED ORDER — CARVEDILOL 6.25 MG PO TABS: 6.2500 mg | ORAL_TABLET | Freq: Two times a day (BID) | ORAL | 3 refills | Status: DC

## 2018-10-29 MED ORDER — FUROSEMIDE 40 MG PO TABS: ORAL_TABLET | ORAL | 3 refills | Status: DC

## 2018-10-29 MED ORDER — MELOXICAM 7.5 MG PO TABS: 7.5000 mg | ORAL_TABLET | Freq: Every day | ORAL | 4 refills | Status: DC

## 2018-10-29 MED ORDER — AMLODIPINE BESYLATE 5 MG PO TABS: 5.0000 mg | ORAL_TABLET | Freq: Every day | ORAL | 0 refills | Status: DC

## 2018-10-29 MED ORDER — BENAZEPRIL HCL 40 MG PO TABS: 40.0000 mg | ORAL_TABLET | Freq: Every day | ORAL | 0 refills | Status: DC

## 2018-10-29 MED ORDER — BENZONATATE 200 MG PO CAPS: 200.0000 mg | ORAL_CAPSULE | Freq: Three times a day (TID) | ORAL | 1 refills | Status: DC | PRN

## 2018-10-29 NOTE — Patient Instructions (Signed)

## 2018-10-29 NOTE — Progress Notes (Signed)
   Subjective:    Patient ID: Paige Ferguson, female    DOB: 1921-06-30, 84 y.o.   MRN: [id number]  Chief Complaint  Patient presents with  . Wheezing    Wheezing   This is a new problem. The current episode started 1 to 4 weeks ago. The problem occurs intermittently. The problem has been waxing and waning. Associated symptoms include coughing, rhinorrhea and shortness of breath. Pertinent negatives include no chills, ear pain, fever or sore throat. She has tried rest and OTC cough suppressant for the symptoms. The treatment provided mild relief.      Review of Systems  Constitutional: Negative for chills and fever.  HENT: Positive for rhinorrhea. Negative for ear pain and sore throat.   Respiratory: Positive for cough, shortness of breath and wheezing.   All other systems reviewed and are negative.      Objective:   Physical Exam  Constitutional: She is oriented to person, place, and time. She appears well-developed and well-nourished. No distress.  HENT:  Head: Normocephalic and atraumatic.  Right Ear: External ear normal.  Left Ear: External ear normal.  Mouth/Throat: Oropharynx is clear and moist.  Eyes: Pupils are equal, round, and reactive to light.  Neck: Normal range of motion. Neck supple. No thyromegaly present.  Cardiovascular: Normal rate, regular rhythm, normal heart sounds and intact distal pulses.  No murmur heard. Pulmonary/Chest: Effort normal. No respiratory distress. She has wheezes. She has rales.  Abdominal: Soft. Bowel sounds are normal. She exhibits no distension. There is no tenderness.  Musculoskeletal: Normal range of motion. She exhibits no edema or tenderness.  Neurological: She is alert and oriented to person, place, and time. She has normal reflexes. No cranial nerve deficit.  Skin: Skin is warm and dry.  Psychiatric: She has a normal mood and affect. Her behavior is normal. Judgment and thought content normal.  Vitals reviewed.    BP (!) 148/85    Pulse 96   Temp (!) 97.2 F (36.2 C) (Oral)   Wt 141 lb (64 kg)   BMI 30.51 kg/m      Assessment & Plan:  Paige Ferguson comes in today with chief complaint of Wheezing   Diagnosis and orders addressed:  1. Acute bacterial bronchitis - Take meds as prescribed - Use a cool mist humidifier  -Use saline nose sprays frequently -Force fluids -For any cough or congestion  Use plain Mucinex- regular strength or max strength is fine -For fever or aces or pains- take tylenol or ibuprofen. -Throat lozenges if help -RTO if symptoms worsen or do not improve - azithromycin (ZITHROMAX) 250 MG tablet; Take 500 mg once, then 250 mg for four days  Dispense: 6 tablet; Refill: 0 - benzonatate (TESSALON) 200 MG capsule; Take 1 capsule (200 mg total) by mouth 3 (three) times daily as needed.  Dispense: 30 capsule; Refill: Watkins, FNP

## 2019-01-01 ENCOUNTER — Ambulatory Visit (INDEPENDENT_AMBULATORY_CARE_PROVIDER_SITE_OTHER): Payer: Medicare Other

## 2019-01-01 ENCOUNTER — Encounter: Payer: Self-pay | Admitting: Family

## 2019-01-01 ENCOUNTER — Ambulatory Visit (INDEPENDENT_AMBULATORY_CARE_PROVIDER_SITE_OTHER): Payer: Medicare Other | Admitting: Family

## 2019-01-01 VITALS — BP 137/93 | HR 92 | Temp 97.7°F | Ht <= 58 in | Wt 141.4 lb

## 2019-01-01 DIAGNOSIS — E669 Obesity, unspecified: Secondary | ICD-10-CM

## 2019-01-01 DIAGNOSIS — R0602 Shortness of breath: Secondary | ICD-10-CM | POA: Diagnosis not present

## 2019-01-01 DIAGNOSIS — M810 Age-related osteoporosis without current pathological fracture: Secondary | ICD-10-CM | POA: Diagnosis not present

## 2019-01-01 DIAGNOSIS — M170 Bilateral primary osteoarthritis of knee: Secondary | ICD-10-CM | POA: Diagnosis not present

## 2019-01-01 DIAGNOSIS — I10 Essential (primary) hypertension: Secondary | ICD-10-CM

## 2019-01-01 DIAGNOSIS — E8881 Metabolic syndrome: Secondary | ICD-10-CM

## 2019-01-01 IMAGING — DX DG CHEST 2V
2 series · 2 of 2 positions shown · non-contrast
Comparison: None.

CLINICAL DATA: Shortness of breath. Chronic kidney disease.

EXAM:
CHEST - 2 VIEW

[chest pa]
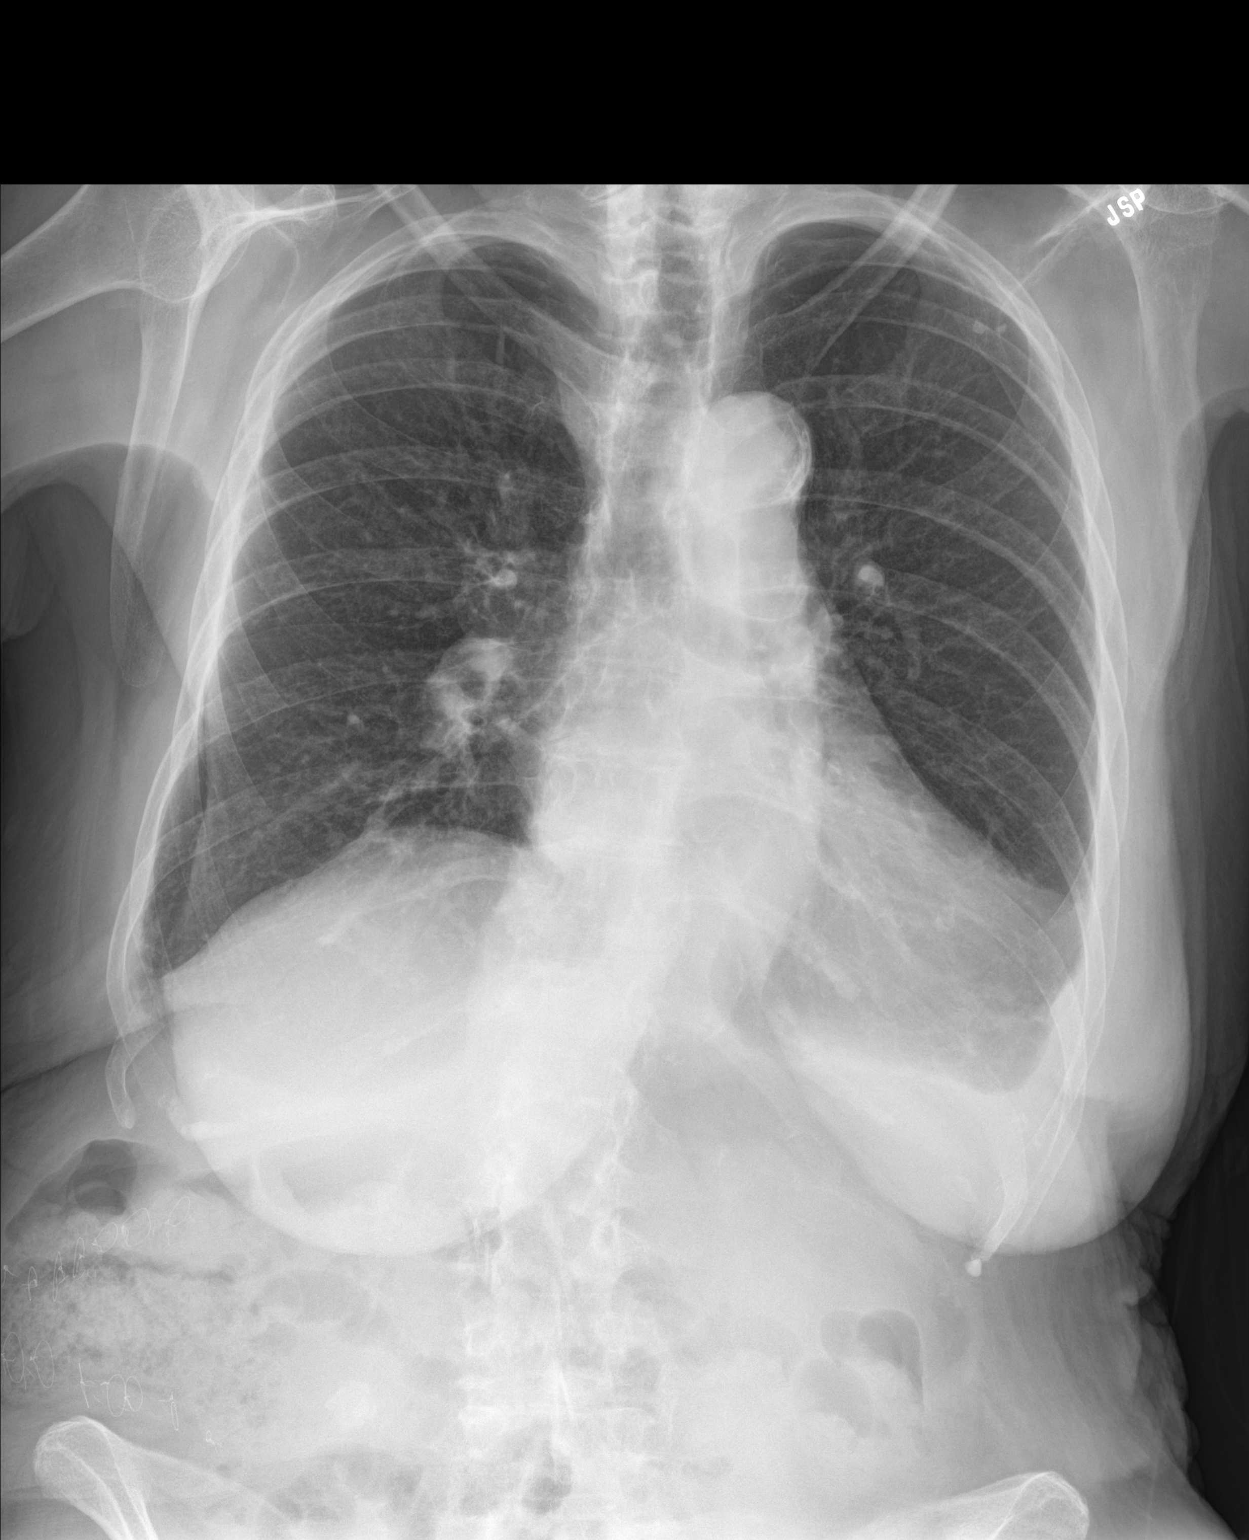

[chest lat]
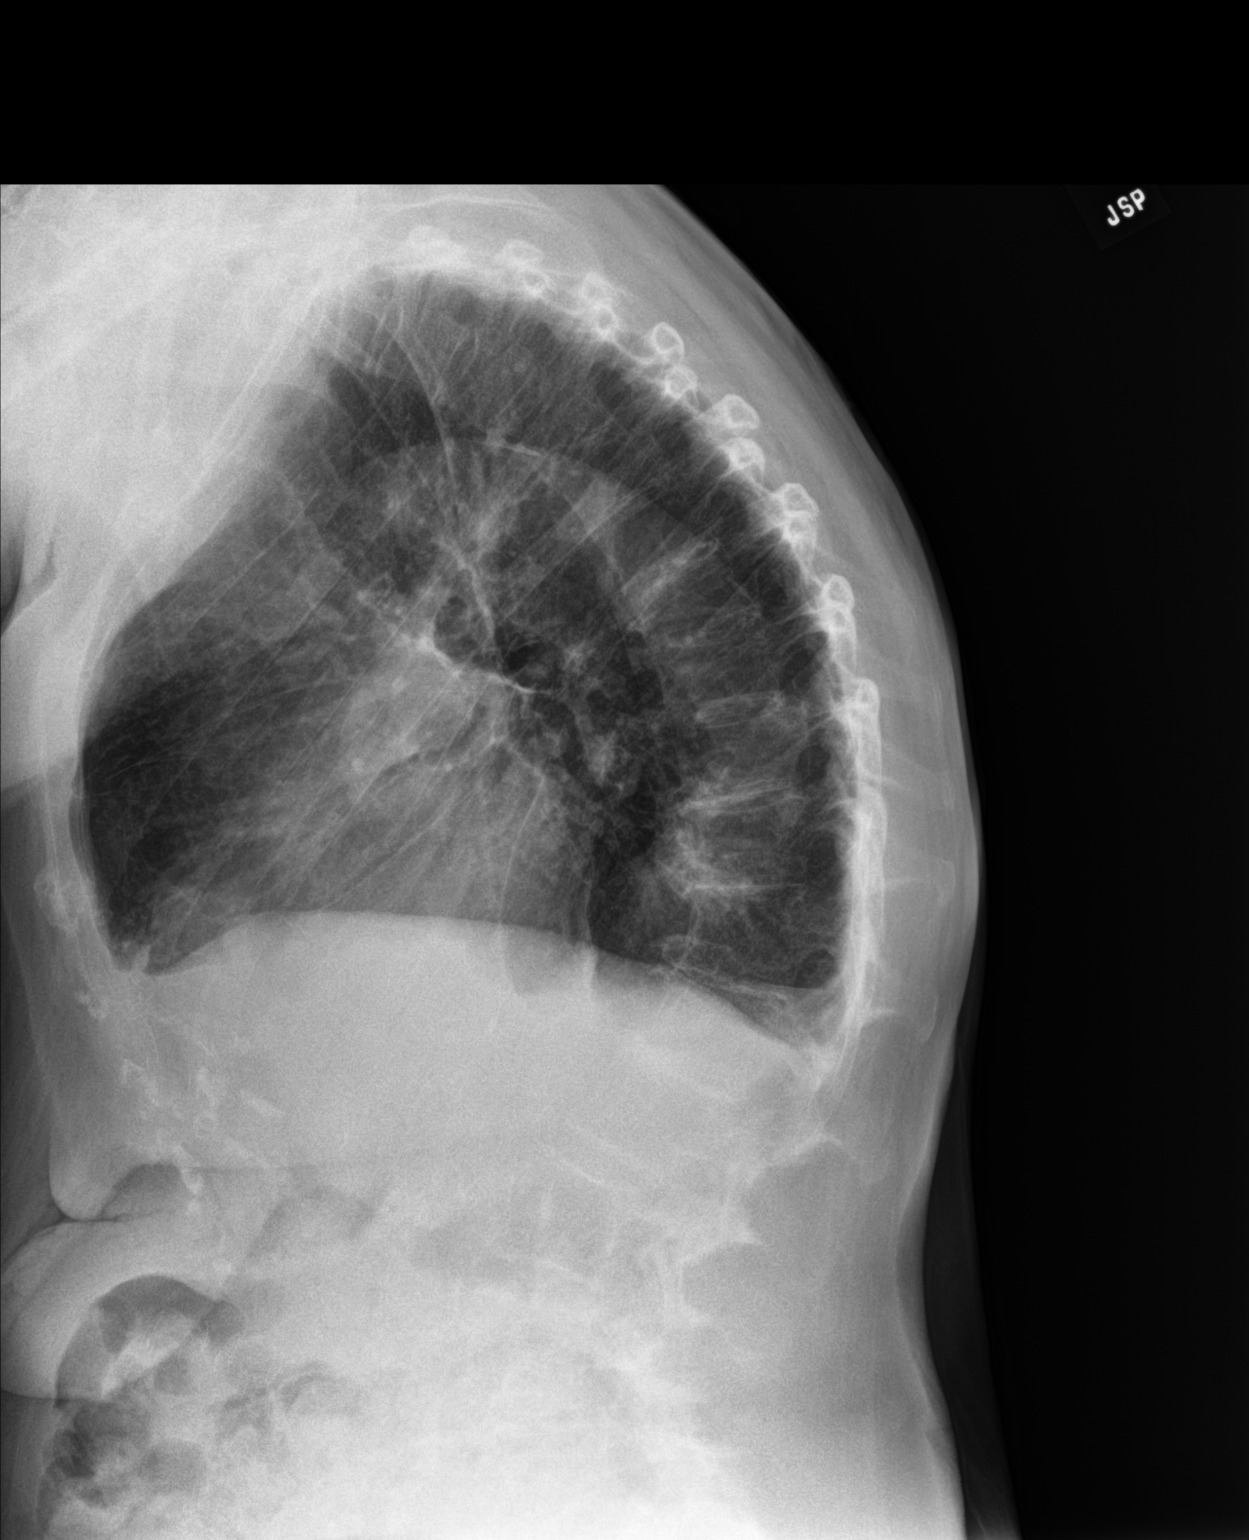

[2 of 2 positions shown; findings below may reference images not displayed]

FINDINGS: Mild cardiomegaly noted. Aortic atherosclerosis. Mild
pleural-parenchymal scarring seen in left lung base. No evidence of
pulmonary infiltrate or edema. Moderate to severe lower thoracic
spine degenerative disc disease noted with associated kyphosis.
IMPRESSION: Mild cardiomegaly and left basilar scarring. No active lung disease.

## 2019-01-01 MED ORDER — AMLODIPINE BESYLATE 5 MG PO TABS: 5.0000 mg | ORAL_TABLET | Freq: Every day | ORAL | 2 refills | Status: DC

## 2019-01-01 MED ORDER — BENAZEPRIL HCL 40 MG PO TABS: 40.0000 mg | ORAL_TABLET | Freq: Every day | ORAL | 2 refills | Status: DC

## 2019-01-01 NOTE — Progress Notes (Signed)
Subjective:    Patient ID: Paige Ferguson, female    DOB: 1921/04/04, 85 y.o.   MRN: [id number]  Chief Complaint  Patient presents with  . Medical Management of Chronic Issues    six month recheck    Hypertension  This is a chronic problem. The current episode started more than 1 year ago. The problem has been waxing and waning since onset. The problem is controlled. Associated symptoms include malaise/fatigue, peripheral edema ("a little") and shortness of breath (with exertion). Pertinent negatives include no headaches or orthopnea. Risk factors for coronary artery disease include dyslipidemia and sedentary lifestyle. The current treatment provides moderate improvement. There is no history of kidney disease, CAD/MI or heart failure.  Arthritis  Presents for follow-up visit. She complains of pain and stiffness. The symptoms have been stable. Affected locations include the right knee and left knee. Her pain is at a severity of 7/10.  Hyperlipidemia  This is a chronic problem. The current episode started more than 1 year ago. Recent lipid tests were reviewed and are normal. Associated symptoms include shortness of breath (with exertion). Current antihyperlipidemic treatment includes diet change. The current treatment provides moderate improvement of lipids. Risk factors for coronary artery disease include dyslipidemia, a sedentary lifestyle and post-menopausal.  Shortness of Breath  This is a new problem. The current episode started more than 1 month ago. The problem occurs intermittently. The problem has been waxing and waning. Pertinent negatives include no headaches, orthopnea, sore throat or sputum production. Treatments tried: zpak. The treatment provided mild relief. There is no history of a heart failure.  Metabolic Syndrome Pt states she tries to eat healthy. She is not very active.  Osteoporosis  Pt's last dexa scan was 12/02/14.    Review of Systems  Constitutional: Positive for  malaise/fatigue.  HENT: Negative for sore throat.   Respiratory: Positive for shortness of breath (with exertion). Negative for sputum production.   Cardiovascular: Negative for orthopnea.  Musculoskeletal: Positive for arthritis and stiffness.  Neurological: Negative for headaches.  All other systems reviewed and are negative.      Objective:   Physical Exam Vitals signs reviewed.  Constitutional:      General: She is not in acute distress.    Appearance: She is well-developed.  HENT:     Head: Normocephalic and atraumatic.     Right Ear: Tympanic membrane normal.     Left Ear: Tympanic membrane normal.  Eyes:     Pupils: Pupils are equal, round, and reactive to light.  Neck:     Musculoskeletal: Normal range of motion and neck supple.     Thyroid: No thyromegaly.  Cardiovascular:     Rate and Rhythm: Normal rate and regular rhythm.     Heart sounds: Normal heart sounds. No murmur.  Pulmonary:     Effort: Pulmonary effort is normal. No respiratory distress.     Breath sounds: Normal breath sounds. No wheezing.  Abdominal:     General: Bowel sounds are normal. There is no distension.     Palpations: Abdomen is soft.     Tenderness: There is no abdominal tenderness.  Musculoskeletal:        General: Swelling present. No tenderness.     Right lower leg: Edema (3+) present.     Left lower leg: Edema (3+) present.     Comments: Generalized weakness, using rolling walker  Skin:    General: Skin is warm and dry.  Neurological:     Mental  Status: She is alert and oriented to person, place, and time.     Cranial Nerves: No cranial nerve deficit.     Deep Tendon Reflexes: Reflexes are normal and symmetric.  Psychiatric:        Behavior: Behavior normal.        Thought Content: Thought content normal.        Judgment: Judgment normal.       BP (!) 137/93   Pulse 92   Temp 97.7 F (36.5 C) (Oral)   Ht _0  (1.448 m)   Wt 141 lb 6.4 oz (64.1 kg)   BMI 30.60 kg/m       Assessment & Plan:  Paige Ferguson comes in today with chief complaint of Medical Management of Chronic Issues (six month recheck)   Diagnosis and orders addressed:  1. Essential hypertension - amLODipine (NORVASC) 5 MG tablet; Take 1 tablet (5 mg total) by mouth daily.  Dispense: 90 tablet; Refill: 2 - benazepril (LOTENSIN) 40 MG tablet; Take 1 tablet (40 mg total) by mouth daily.  Dispense: 90 tablet; Refill: 2 - CMP14+EGFR - CBC with Differential/Platelet  2. Primary osteoarthritis of both knees - CMP14+EGFR - CBC with Differential/Platelet  3. Metabolic syndrome - MCN47+SJGG - CBC with Differential/Platelet - Lipid panel  4. Obesity (BMI 30-39.9) - CMP14+EGFR - CBC with Differential/Platelet  5. Osteoporosis, unspecified osteoporosis type, unspecified pathological fracture presence - CMP14+EGFR - CBC with Differential/Platelet  6. SOB (shortness of breath) - CMP14+EGFR - CBC with Differential/Platelet - DG Chest 2 View; Future   Labs pending Health Maintenance reviewed Diet and exercise encouraged  Follow up plan: 6 months    Evelina Dun, FNP

## 2019-01-01 NOTE — Patient Instructions (Signed)

## 2019-01-02 LAB — CBC WITH DIFFERENTIAL/PLATELET
Basophils Absolute: 0.1 10*3/uL (ref 0.0–0.2)
Basos: 1 %
EOS (ABSOLUTE): 0.2 10*3/uL (ref 0.0–0.4)
Eos: 3 %
Hematocrit: 39 % (ref 34.0–46.6)
Hemoglobin: 13.7 g/dL (ref 11.1–15.9)
Immature Grans (Abs): 0 10*3/uL (ref 0.0–0.1)
Immature Granulocytes: 0 %
Lymphocytes Absolute: 2.6 10*3/uL (ref 0.7–3.1)
Lymphs: 41 %
MCH: 33.7 pg — AB (ref 26.6–33.0)
MCHC: 35.1 g/dL (ref 31.5–35.7)
MCV: 96 fL (ref 79–97)
Monocytes Absolute: 0.9 10*3/uL (ref 0.1–0.9)
Monocytes: 15 %
Neutrophils Absolute: 2.5 10*3/uL (ref 1.4–7.0)
Neutrophils: 40 %
PLATELETS: 242 10*3/uL (ref 150–450)
RBC: 4.07 x10E6/uL (ref 3.77–5.28)
RDW: 13.4 % (ref 11.7–15.4)
WBC: 6.3 10*3/uL (ref 3.4–10.8)

## 2019-01-02 LAB — CMP14+EGFR
A/G RATIO: 1.5 (ref 1.2–2.2)
ALT: 13 IU/L (ref 0–32)
AST: 23 IU/L (ref 0–40)
Albumin: 4 g/dL (ref 3.5–4.6)
Alkaline Phosphatase: 65 IU/L (ref 39–117)
BUN/Creatinine Ratio: 28 (ref 12–28)
BUN: 29 mg/dL (ref 10–36)
Bilirubin Total: 0.6 mg/dL (ref 0.0–1.2)
CALCIUM: 10 mg/dL (ref 8.7–10.3)
CO2: 28 mmol/L (ref 20–29)
Chloride: 99 mmol/L (ref 96–106)
Creatinine, Ser: 1.03 mg/dL — ABNORMAL HIGH (ref 0.57–1.00)
GFR calc Af Amer: 53 mL/min/{1.73_m2} — ABNORMAL LOW (ref >59)
GFR calc non Af Amer: 46 mL/min/{1.73_m2} — ABNORMAL LOW (ref >59)
Globulin, Total: 2.6 g/dL (ref 1.5–4.5)
Glucose: 92 mg/dL (ref 65–99)
POTASSIUM: 4.4 mmol/L (ref 3.5–5.2)
Sodium: 143 mmol/L (ref 134–144)
Total Protein: 6.6 g/dL (ref 6.0–8.5)

## 2019-01-02 LAB — LIPID PANEL
Chol/HDL Ratio: 3.1 ratio (ref 0.0–4.4)
Cholesterol, Total: 137 mg/dL (ref 100–199)
HDL: 44 mg/dL (ref >39)
LDL Calculated: 81 mg/dL (ref 0–99)
Triglycerides: 58 mg/dL (ref 0–149)
VLDL Cholesterol Cal: 12 mg/dL (ref 5–40)

## 2019-01-27 ENCOUNTER — Other Ambulatory Visit: Payer: Self-pay | Admitting: *Deleted

## 2019-01-27 ENCOUNTER — Other Ambulatory Visit: Payer: Self-pay | Admitting: Family

## 2019-01-27 ENCOUNTER — Telehealth: Payer: Self-pay | Admitting: Family

## 2019-01-27 DIAGNOSIS — M810 Age-related osteoporosis without current pathological fracture: Secondary | ICD-10-CM

## 2019-01-27 MED ORDER — RALOXIFENE HCL 60 MG PO TABS: 60.0000 mg | ORAL_TABLET | Freq: Every day | ORAL | 5 refills | Status: DC

## 2019-01-27 NOTE — Telephone Encounter (Signed)
Patient aware, per message left on voice mail,   script is ready. 

## 2019-02-06 DIAGNOSIS — D229 Melanocytic nevi, unspecified: Secondary | ICD-10-CM | POA: Diagnosis not present

## 2019-02-06 DIAGNOSIS — C44529 Squamous cell carcinoma of skin of other part of trunk: Secondary | ICD-10-CM | POA: Diagnosis not present

## 2019-02-06 DIAGNOSIS — L821 Other seborrheic keratosis: Secondary | ICD-10-CM | POA: Diagnosis not present

## 2019-02-10 DIAGNOSIS — M1712 Unilateral primary osteoarthritis, left knee: Secondary | ICD-10-CM | POA: Diagnosis not present

## 2019-02-10 DIAGNOSIS — M25562 Pain in left knee: Secondary | ICD-10-CM | POA: Diagnosis not present

## 2019-02-24 DIAGNOSIS — M1712 Unilateral primary osteoarthritis, left knee: Secondary | ICD-10-CM | POA: Diagnosis not present

## 2019-03-06 DIAGNOSIS — M1712 Unilateral primary osteoarthritis, left knee: Secondary | ICD-10-CM | POA: Diagnosis not present

## 2019-04-22 ENCOUNTER — Other Ambulatory Visit: Payer: Self-pay

## 2019-04-22 ENCOUNTER — Ambulatory Visit (INDEPENDENT_AMBULATORY_CARE_PROVIDER_SITE_OTHER): Payer: Medicare Other | Admitting: Family

## 2019-04-22 ENCOUNTER — Encounter: Payer: Self-pay | Admitting: Family

## 2019-04-22 DIAGNOSIS — R519 Headache, unspecified: Secondary | ICD-10-CM

## 2019-04-22 DIAGNOSIS — R51 Headache: Secondary | ICD-10-CM

## 2019-04-22 DIAGNOSIS — R42 Dizziness and giddiness: Secondary | ICD-10-CM | POA: Diagnosis not present

## 2019-04-22 NOTE — Progress Notes (Signed)
   Virtual Visit via telephone Note  I connected with Paige Ferguson on 04/22/19 at 11:40 AM by telephone and verified that I am speaking with the correct person using two identifiers. SHALAMAR PLOURDE is currently located at home and grandson is currently with her during visit. The provider, Evelina Dun, FNP is located in their office at time of visit.  I discussed the limitations, risks, security and privacy concerns of performing an evaluation and management service by telephone and the availability of in person appointments. I also discussed with the patient that there may be a patient responsible charge related to this service. The patient expressed understanding and agreed to proceed.   History and Present Illness:  PT calls the office with dizziness with standing. She reports her BP is 109/75.   She states she is having a sensation on the top left of her head. She states this comes and goes. She states she has been the Neurologists in the past and was told this was benign.  Dizziness  This is a recurrent problem. The current episode started in the past 7 days. The problem occurs intermittently. The problem has been waxing and waning. Pertinent negatives include no arthralgias, chest pain, congestion, coughing, diaphoresis, headaches, nausea, visual change or vomiting.      Review of Systems  Constitutional: Negative for diaphoresis.  HENT: Negative for congestion.   Respiratory: Negative for cough.   Cardiovascular: Negative for chest pain.  Gastrointestinal: Negative for nausea and vomiting.  Musculoskeletal: Negative for arthralgias.  Neurological: Positive for dizziness. Negative for headaches.     Observations/Objective: No SOB or distress, 109/75   Assessment and Plan: 1. Dizziness We will decrease lasix to only 40 mg daily and she will hold the 20 mg in the evening unless she starts having increased edema Stay hydrated  Fall preventions discussed Follow up in 1 week  or sooner if dizziness worsens. If dizziness and hypotension continues we will decrease BP medication.   2. Scalp tenderness We will hold off on Neurologists referral at this time. Denies any changes with gait, speech, or vision. Discussed the signs of shingles as this may be the beginning of shingles?    I discussed the assessment and treatment plan with the patient. The patient was provided an opportunity to ask questions and all were answered. The patient agreed with the plan and demonstrated an understanding of the instructions.   The patient was advised to call back or seek an in-person evaluation if the symptoms worsen or if the condition fails to improve as anticipated.  The above assessment and management plan was discussed with the patient. The patient verbalized understanding of and has agreed to the management plan. Patient is aware to call the clinic if symptoms persist or worsen. Patient is aware when to return to the clinic for a follow-up visit. Patient educated on when it is appropriate to go to the emergency department.   Time call ended:  12:09pm  I provided 29 minutes of non-face-to-face time during this encounter.    Evelina Dun, FNP

## 2019-05-02 ENCOUNTER — Ambulatory Visit: Payer: Medicare Other | Admitting: Family

## 2019-05-07 ENCOUNTER — Other Ambulatory Visit: Payer: Self-pay

## 2019-05-08 ENCOUNTER — Ambulatory Visit (INDEPENDENT_AMBULATORY_CARE_PROVIDER_SITE_OTHER): Payer: Medicare Other | Admitting: Family

## 2019-05-08 ENCOUNTER — Encounter: Payer: Self-pay | Admitting: Family

## 2019-05-08 VITALS — BP 147/87 | HR 126 | Temp 98.3°F | Ht <= 58 in | Wt 143.8 lb

## 2019-05-08 DIAGNOSIS — I10 Essential (primary) hypertension: Secondary | ICD-10-CM

## 2019-05-08 DIAGNOSIS — E669 Obesity, unspecified: Secondary | ICD-10-CM

## 2019-05-08 DIAGNOSIS — R609 Edema, unspecified: Secondary | ICD-10-CM

## 2019-05-08 DIAGNOSIS — R531 Weakness: Secondary | ICD-10-CM | POA: Diagnosis not present

## 2019-05-08 DIAGNOSIS — R0602 Shortness of breath: Secondary | ICD-10-CM

## 2019-05-08 NOTE — Progress Notes (Signed)
Subjective:    Patient ID: Paige Ferguson, female    DOB: [birth year], 85 y.o.   MRN: [id number]  Chief Complaint  Patient presents with  . follow up dizziness, short of breath    HPI PT presents to the office today to recheck dizziness. She had a telephone visit on 04/22/19 and we decreased her lasix to 40 mg daily  from 40 mg in AM and 20 mg in the evening. It was felt her dizziness was from hypotension and some dehydration, because she said she was getting hypotensive when standing. She states she stays hydrated. She states she started having edema so she restarted her lasix.   She states her dizziness is improved, but continues to have intermittent weakness. She gets SOB when walking or exertion. She has had  2 lbs weight gain since 12/2018.    Review of Systems  Neurological: Positive for weakness.  All other systems reviewed and are negative.      Objective:   Physical Exam Vitals signs reviewed.  Constitutional:      General: She is not in acute distress.    Appearance: She is well-developed. She is obese.  HENT:     Head: Normocephalic and atraumatic.     Right Ear: Tympanic membrane normal.     Left Ear: Tympanic membrane normal.  Eyes:     Pupils: Pupils are equal, round, and reactive to light.  Neck:     Musculoskeletal: Normal range of motion and neck supple.     Thyroid: No thyromegaly.  Cardiovascular:     Rate and Rhythm: Normal rate and regular rhythm.     Heart sounds: Murmur present.  Pulmonary:     Effort: Pulmonary effort is normal. No respiratory distress.     Breath sounds: Decreased breath sounds and rales present. No wheezing.  Abdominal:     General: Bowel sounds are normal. There is no distension.     Palpations: Abdomen is soft.     Tenderness: There is no abdominal tenderness.  Musculoskeletal:        General: No tenderness.     Right lower leg: Edema (3+) present.     Left lower leg: Edema (2+) present.     Comments: Generalized weakness,  using rolling walker  Skin:    General: Skin is warm and dry.  Neurological:     Mental Status: She is alert and oriented to person, place, and time.     Cranial Nerves: No cranial nerve deficit.     Deep Tendon Reflexes: Reflexes are normal and symmetric.  Psychiatric:        Behavior: Behavior normal.        Thought Content: Thought content normal.        Judgment: Judgment normal.       BP (!) 147/87   Pulse 98   Temp 98.3 F (36.8 C) (Oral)   Ht 4' 9"  (1.448 m)   Wt 143 lb 12.8 oz (65.2 kg)   SpO2 95% Comment: Resting on room air.  BMI 31.12 kg/m      Assessment & Plan:  Paige Ferguson comes in today with chief complaint of follow up dizziness, short of breath   Diagnosis and orders addressed:  1. Essential hypertension - CMP14+EGFR - CBC with Differential/Platelet - Brain natriuretic peptide  2. Peripheral edema Will increase Lasix to 40 mg BID from 40 mg in AM and 20 mg in evening Given age and symptoms, I'm sure she has  CHF - CMP14+EGFR - CBC with Differential/Platelet - Brain natriuretic peptide  3. SOB (shortness of breath) Low salt diet Continue lasix Will order prn oxygen - CMP14+EGFR - CBC with Differential/Platelet - Brain natriuretic peptide - For home use only DME oxygen  4. Obesity (BMI 30-39.9)  5. Weakness - Ambulatory referral to Cardiology   RTO in 1 week   Evelina Dun, FNP

## 2019-05-08 NOTE — Patient Instructions (Signed)

## 2019-05-09 LAB — CMP14+EGFR
ALT: 14 IU/L (ref 0–32)
AST: 23 IU/L (ref 0–40)
Albumin/Globulin Ratio: 1.6 (ref 1.2–2.2)
Albumin: 4.1 g/dL (ref 3.5–4.6)
Alkaline Phosphatase: 70 IU/L (ref 39–117)
BUN/Creatinine Ratio: 27 (ref 12–28)
BUN: 26 mg/dL (ref 10–36)
Bilirubin Total: 0.7 mg/dL (ref 0.0–1.2)
CO2: 28 mmol/L (ref 20–29)
Calcium: 9.6 mg/dL (ref 8.7–10.3)
Chloride: 98 mmol/L (ref 96–106)
Creatinine, Ser: 0.97 mg/dL (ref 0.57–1.00)
GFR calc Af Amer: 57 mL/min/{1.73_m2} — ABNORMAL LOW (ref >59)
GFR calc non Af Amer: 49 mL/min/{1.73_m2} — ABNORMAL LOW (ref >59)
Globulin, Total: 2.5 g/dL (ref 1.5–4.5)
Glucose: 96 mg/dL (ref 65–99)
Potassium: 4.1 mmol/L (ref 3.5–5.2)
Sodium: 140 mmol/L (ref 134–144)
Total Protein: 6.6 g/dL (ref 6.0–8.5)

## 2019-05-09 LAB — CBC WITH DIFFERENTIAL/PLATELET
Basophils Absolute: 0.1 10*3/uL (ref 0.0–0.2)
Basos: 1 %
EOS (ABSOLUTE): 0.3 10*3/uL (ref 0.0–0.4)
Eos: 4 %
Hematocrit: 40.5 % (ref 34.0–46.6)
Hemoglobin: 14 g/dL (ref 11.1–15.9)
Immature Grans (Abs): 0 10*3/uL (ref 0.0–0.1)
Immature Granulocytes: 0 %
Lymphocytes Absolute: 2 10*3/uL (ref 0.7–3.1)
Lymphs: 30 %
MCH: 33.3 pg — ABNORMAL HIGH (ref 26.6–33.0)
MCHC: 34.6 g/dL (ref 31.5–35.7)
MCV: 96 fL (ref 79–97)
Monocytes Absolute: 1.2 10*3/uL — ABNORMAL HIGH (ref 0.1–0.9)
Monocytes: 17 %
Neutrophils Absolute: 3.3 10*3/uL (ref 1.4–7.0)
Neutrophils: 48 %
Platelets: 265 10*3/uL (ref 150–450)
RBC: 4.21 x10E6/uL (ref 3.77–5.28)
RDW: 12.7 % (ref 11.7–15.4)
WBC: 6.9 10*3/uL (ref 3.4–10.8)

## 2019-05-09 LAB — BRAIN NATRIURETIC PEPTIDE: BNP: 165.7 pg/mL — ABNORMAL HIGH (ref 0.0–100.0)

## 2019-05-12 ENCOUNTER — Ambulatory Visit (INDEPENDENT_AMBULATORY_CARE_PROVIDER_SITE_OTHER): Payer: Medicare Other

## 2019-05-12 ENCOUNTER — Other Ambulatory Visit: Payer: Self-pay

## 2019-05-12 ENCOUNTER — Ambulatory Visit (INDEPENDENT_AMBULATORY_CARE_PROVIDER_SITE_OTHER): Payer: Medicare Other | Admitting: Family

## 2019-05-12 ENCOUNTER — Encounter: Payer: Self-pay | Admitting: Family

## 2019-05-12 VITALS — BP 132/84 | HR 100 | Temp 98.2°F | Ht <= 58 in | Wt 143.0 lb

## 2019-05-12 DIAGNOSIS — I509 Heart failure, unspecified: Secondary | ICD-10-CM | POA: Diagnosis not present

## 2019-05-12 DIAGNOSIS — R600 Localized edema: Secondary | ICD-10-CM

## 2019-05-12 DIAGNOSIS — R609 Edema, unspecified: Secondary | ICD-10-CM

## 2019-05-12 DIAGNOSIS — R0602 Shortness of breath: Secondary | ICD-10-CM

## 2019-05-12 IMAGING — DX CHEST - 2 VIEW
2 series · 2 of 2 positions shown · non-contrast
Comparison: [DATE]

CLINICAL DATA: Shortness of breath. Congestive heart failure.
Peripheral edema.

EXAM:
CHEST - 2 VIEW

[chest ap]
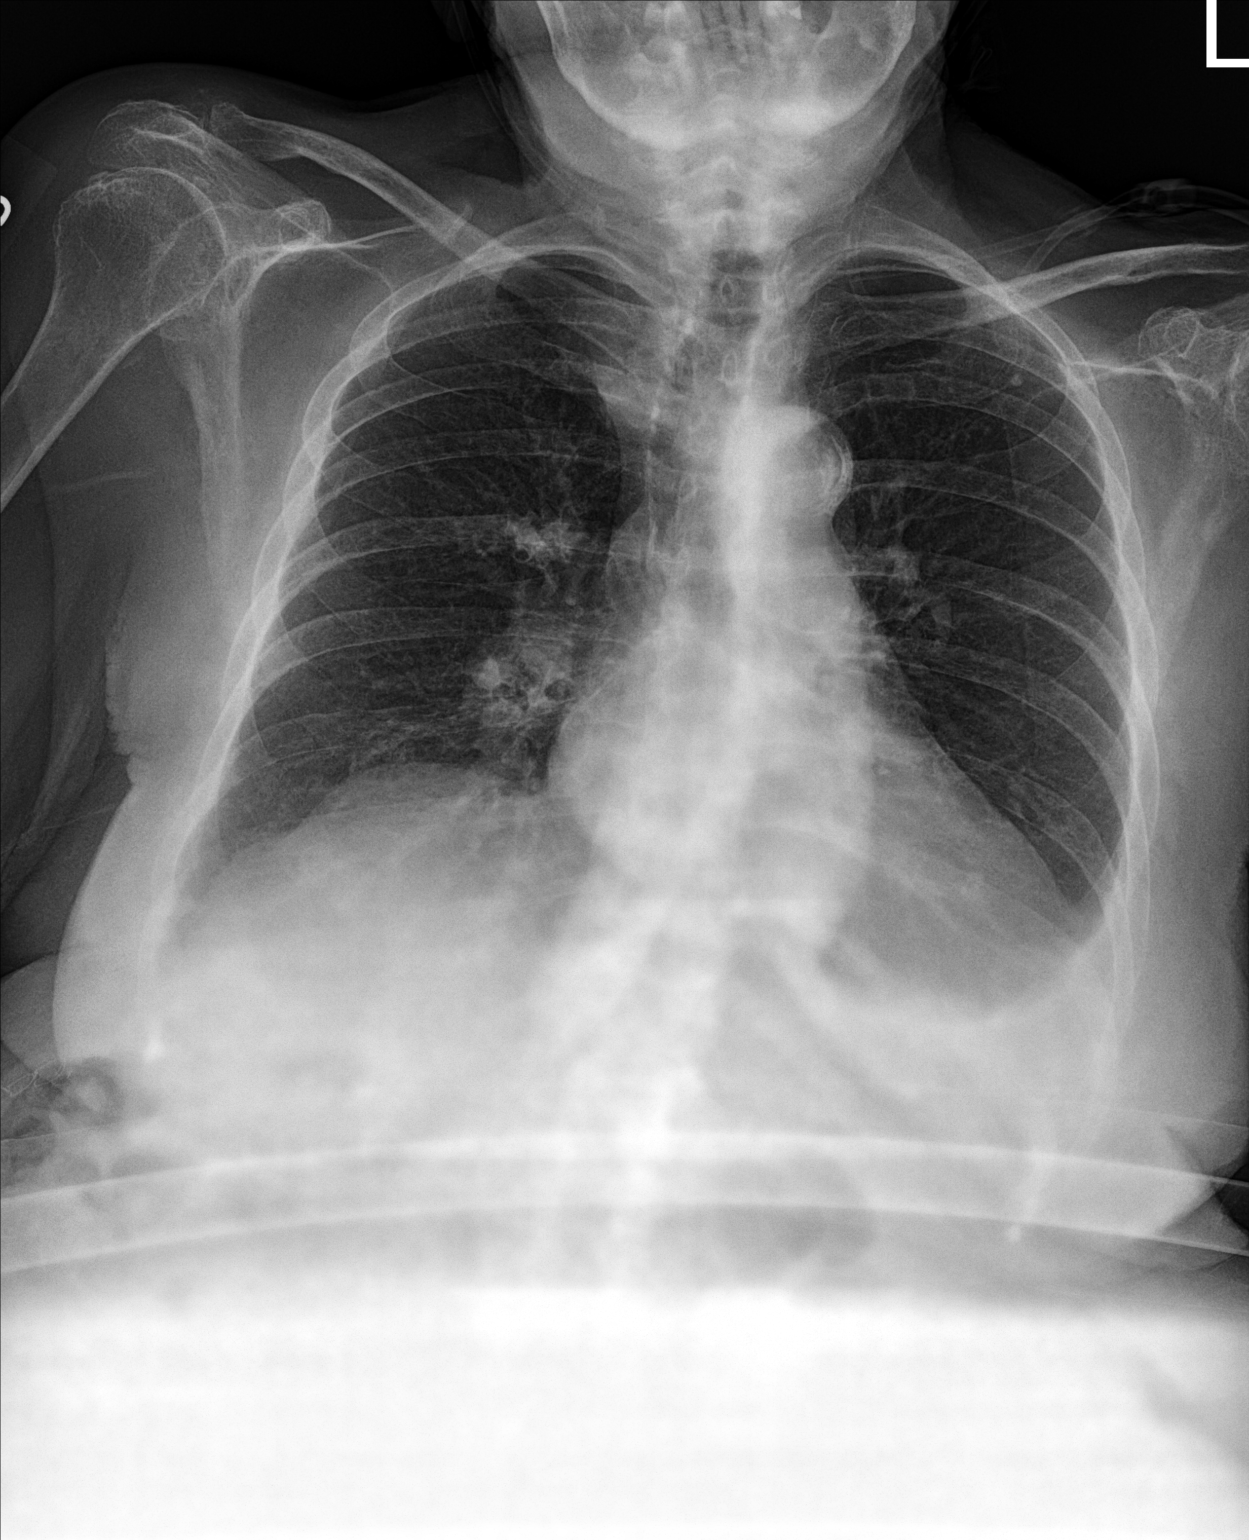

[chest lat]
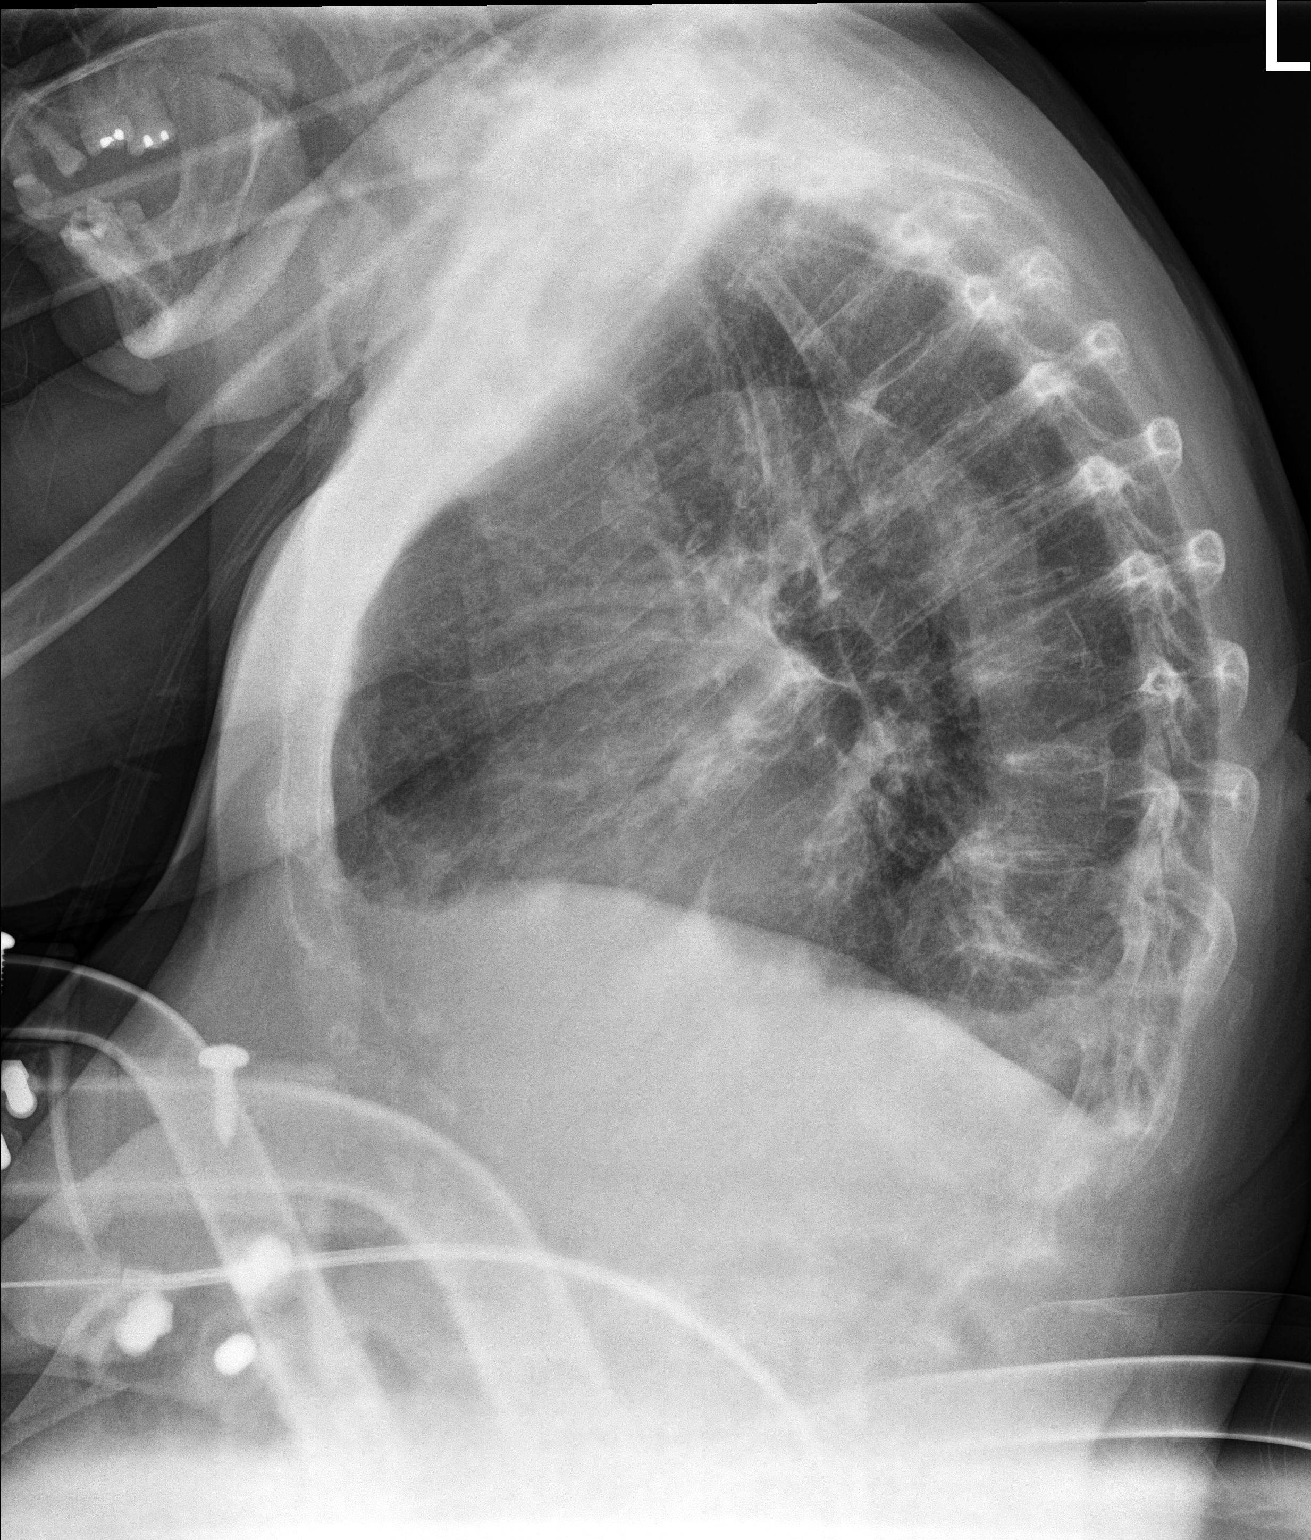

[2 of 2 positions shown; findings below may reference images not displayed]

FINDINGS: The heart size and pulmonary vascularity are normal. Tortuosity and
calcification of the thoracic aorta. Chronic blunting of the left
costophrenic angle laterally. No consolidative infiltrates or new
effusions. Stable small calcified granuloma in the left upper lobe.

Chronic accentuation of the thoracic kyphosis.
IMPRESSION: No acute abnormalities. Chronic blunting of the left costophrenic
angle

Aortic Atherosclerosis ([ZW]-[ZW]).

## 2019-05-12 NOTE — Progress Notes (Signed)
   Subjective:    Patient ID: Paige Ferguson, female    DOB: April 25, 1921, 85 y.o.   MRN: [id number]  Chief Complaint  Patient presents with  . check oxygen    HPI PT presents to the office for recheck of SOB and peripheral edema. She was seen on 05/08/19 and we increased her lasix to BID. She reports her peripheral edema is greatly improved, but continues to have SOB. We were unable to order oxygen because we needed an oxygen reading with 2 L on.   She states her breathing continues to have SOB. Her daughter is upset today that she has not already received the oxygen. This could not be filled because we did not have the O2 sat while walking with O2 on.    Review of Systems  Constitutional: Positive for fatigue.  Respiratory: Positive for shortness of breath.   All other systems reviewed and are negative.      Objective:   Physical Exam Vitals signs reviewed.  Constitutional:      General: She is not in acute distress.    Appearance: She is well-developed.  HENT:     Head: Normocephalic and atraumatic.  Eyes:     Pupils: Pupils are equal, round, and reactive to light.  Neck:     Musculoskeletal: Normal range of motion and neck supple.     Thyroid: No thyromegaly.  Cardiovascular:     Rate and Rhythm: Normal rate and regular rhythm.     Heart sounds: Normal heart sounds. No murmur.  Pulmonary:     Effort: Pulmonary effort is normal. Tachypnea present. No respiratory distress.     Breath sounds: Decreased breath sounds present. No wheezing.     Comments: Tachypnea improved once she sat down with O2 Abdominal:     General: Bowel sounds are normal. There is no distension.     Palpations: Abdomen is soft.     Tenderness: There is no abdominal tenderness.  Musculoskeletal: Normal range of motion.        General: No tenderness.  Skin:    General: Skin is warm and dry.  Neurological:     Mental Status: She is alert and oriented to person, place, and time.     Cranial Nerves: No  cranial nerve deficit.     Deep Tendon Reflexes: Reflexes are normal and symmetric.  Psychiatric:        Behavior: Behavior normal.        Thought Content: Thought content normal.        Judgment: Judgment normal.      BP 132/84   Pulse 100   Temp 98.2 F (36.8 C)   Ht '4\' 9"'$  (1.448 m)   Wt 143 lb (64.9 kg)   BMI 30.94 kg/m      Assessment & Plan:  Paige Ferguson comes in today with chief complaint of check oxygen   Diagnosis and orders addressed:  1. SOB (shortness of breath) - BMP8+EGFR - DG Chest 2 View; Future  2. Congestive heart failure, unspecified HF chronicity, unspecified heart failure type ([place]) - BMP8+EGFR - DG Chest 2 View; Future  3. Peripheral edema - BMP8+EGFR - DG Chest 2 View; Future  Will order home oxygen Keep Cardiologists appt Low salt diet Continue Lasix for next 2-3 days then we can decrease to 40 mg AM in and 20 mg in evening Labs pending RTO in 1 week to recheck    Evelina Dun, FNP

## 2019-05-13 ENCOUNTER — Telehealth: Payer: Self-pay | Admitting: Family

## 2019-05-13 DIAGNOSIS — I509 Heart failure, unspecified: Secondary | ICD-10-CM | POA: Diagnosis not present

## 2019-05-13 DIAGNOSIS — R0602 Shortness of breath: Secondary | ICD-10-CM | POA: Diagnosis not present

## 2019-05-13 DIAGNOSIS — R609 Edema, unspecified: Secondary | ICD-10-CM | POA: Diagnosis not present

## 2019-05-13 LAB — BMP8+EGFR
BUN/Creatinine Ratio: 27 (ref 12–28)
BUN: 26 mg/dL (ref 10–36)
CO2: 26 mmol/L (ref 20–29)
Calcium: 9.3 mg/dL (ref 8.7–10.3)
Chloride: 100 mmol/L (ref 96–106)
Creatinine, Ser: 0.98 mg/dL (ref 0.57–1.00)
GFR calc Af Amer: 56 mL/min/{1.73_m2} — ABNORMAL LOW (ref >59)
GFR calc non Af Amer: 49 mL/min/{1.73_m2} — ABNORMAL LOW (ref >59)
Glucose: 96 mg/dL (ref 65–99)
Potassium: 4 mmol/L (ref 3.5–5.2)
Sodium: 143 mmol/L (ref 134–144)

## 2019-05-13 NOTE — Telephone Encounter (Signed)
Faxed clinical support notes to [facility] on 05/13/19 at 2:45

## 2019-05-13 NOTE — Telephone Encounter (Signed)
Orders for oxygen have been faxed to [place] at [phone].  Confirmation on fax machine that it went through. Daughter aware.

## 2019-05-19 ENCOUNTER — Other Ambulatory Visit: Payer: Self-pay

## 2019-05-20 ENCOUNTER — Encounter: Payer: Self-pay | Admitting: Family

## 2019-05-20 ENCOUNTER — Ambulatory Visit (INDEPENDENT_AMBULATORY_CARE_PROVIDER_SITE_OTHER): Payer: Medicare Other | Admitting: Family

## 2019-05-20 DIAGNOSIS — I509 Heart failure, unspecified: Secondary | ICD-10-CM | POA: Diagnosis not present

## 2019-05-20 DIAGNOSIS — R0602 Shortness of breath: Secondary | ICD-10-CM

## 2019-05-20 NOTE — Progress Notes (Signed)
   Virtual Visit via telephone Note  I connected with [name] on 05/20/19 at 3:24 pm by telephone and verified that I am speaking with the correct person using two identifiers. TYLIE GOLONKA is currently located at home and daughter and grandson is currently with her during visit. The provider, Evelina Dun, FNP is located in their office at time of visit.  I discussed the limitations, risks, security and privacy concerns of performing an evaluation and management service by telephone and the availability of in person appointments. I also discussed with the patient that there may be a patient responsible charge related to this service. The patient expressed understanding and agreed to proceed.   History and Present Illness:  HPI PT calls today to recheck on SOB. Daughter states her breathing has improved, but continues to have SOB. She is currently using 2L of O2 continuously. Daughter feels like she needs this increased to 3L at times.   She states her swelling improving, but still has some slight swelling in her feet and ankles when she sits for awhile. She is continuing her lasix 40 mg BID.  We placed a referral to a Cardiologists, but she has not been scheduled for this yet. She continues to wear her compression every day.    She states she feels ok, but still feels weak and SOB. She is having some bleeding from her nose from the oxygen and is requesting humidifier.     Review of Systems  Constitutional: Positive for malaise/fatigue.  All other systems reviewed and are negative.    Observations/Objective: SOB with exertion, no distress   Assessment and Plan: 1. SOB (shortness of breath)  2. Congestive heart failure, unspecified HF chronicity, unspecified heart failure type ([place])  Will fax script to increase O2 to 2-3 L as needed for SOB. Discussed she could increase to 3 L when walking to bathroom or kitchen, ect and then decrease to 2 L while sitting. Will also add  humidifer to oxygen.  Will check on Cardiologists referral Continue Lasix 40 mg BID at this time RTO in 1 month      I discussed the assessment and treatment plan with the patient. The patient was provided an opportunity to ask questions and all were answered. The patient agreed with the plan and demonstrated an understanding of the instructions.   The patient was advised to call back or seek an in-person evaluation if the symptoms worsen or if the condition fails to improve as anticipated.  The above assessment and management plan was discussed with the patient. The patient verbalized understanding of and has agreed to the management plan. Patient is aware to call the clinic if symptoms persist or worsen. Patient is aware when to return to the clinic for a follow-up visit. Patient educated on when it is appropriate to go to the emergency department.   Time call ended: 3:44 pm   I provided 20 minutes of non-face-to-face time during this encounter.    Evelina Dun, FNP

## 2019-05-22 ENCOUNTER — Telehealth: Payer: Self-pay | Admitting: *Deleted

## 2019-05-22 DIAGNOSIS — D045 Carcinoma in situ of skin of trunk: Secondary | ICD-10-CM | POA: Diagnosis not present

## 2019-05-22 NOTE — Telephone Encounter (Signed)
I placed call to pt to do covid screening questions prior to appt on July 6,2020.  Left message to call office on Monday

## 2019-05-26 ENCOUNTER — Other Ambulatory Visit: Payer: Self-pay

## 2019-05-26 ENCOUNTER — Encounter: Payer: Self-pay | Admitting: Cardiovascular Disease

## 2019-05-26 ENCOUNTER — Ambulatory Visit (INDEPENDENT_AMBULATORY_CARE_PROVIDER_SITE_OTHER): Payer: Medicare Other | Admitting: Cardiovascular Disease

## 2019-05-26 VITALS — BP 140/66 | HR 90 | Ht <= 58 in | Wt 143.0 lb

## 2019-05-26 DIAGNOSIS — R0602 Shortness of breath: Secondary | ICD-10-CM | POA: Diagnosis not present

## 2019-05-26 DIAGNOSIS — I5032 Chronic diastolic (congestive) heart failure: Secondary | ICD-10-CM

## 2019-05-26 DIAGNOSIS — I4819 Other persistent atrial fibrillation: Secondary | ICD-10-CM

## 2019-05-26 NOTE — Patient Instructions (Addendum)
Medication Instructions:  Please contact our office if you decide you'd like to start a blood thinner for your irregular heart rhythm.   Labwork: None  Testing/Procedures: Your provider has requested that you have an echocardiogram. Echocardiography is a painless test that uses sound waves to create images of your heart. It provides your doctor with information about the size and shape of your heart and how well your heart's chambers and valves are working. This procedure takes approximately one hour. There are no restrictions for this procedure.  Follow-Up: Your provider wants you to follow-up in: 3 months with Dr. Angelena Form or his assistant. You will receive a reminder letter in the mail two months in advance. If you don't receive a letter, please call our office to schedule the follow-up appointment.

## 2019-05-26 NOTE — Telephone Encounter (Signed)
    COVID-19 Pre-Screening Questions:  . In the past 7 to 10 days have you had a cough,  shortness of breath, headache, congestion, fever (100 or greater) body aches, chills, sore throat, or sudden loss of taste or sense of smell? Has chronic shortness of breath and uses oxygen.  No to other questions.  . Have you been around anyone with known Covid 19.-no . Have you been around anyone who is awaiting Covid 19 test results in the past 7 to 10 days? no . Have you been around anyone who has been exposed to Covid 19, or has mentioned symptoms of Covid 19 within the past 7 to 10 days? no  If you have any concerns/questions about symptoms patients report during screening (either on the phone or at threshold). Contact the provider seeing the patient or DOD for further guidance.  If neither are available contact a member of the leadership team.     I spoke with pt's grandson who answered above questions.  Grandson made aware pt will need to wear mask.  She has one and will wear to appointment. He is aware pt should arrive 15 minutes prior to appointment.  Pt's daughter Sheran Spine) will accompany pt to visit. Pt uses push chair and has oxygen and needs assistance with this.

## 2019-05-26 NOTE — Progress Notes (Signed)
Chief Complaint  Patient presents with  . New Patient (Initial Visit)   History of Present Illness: 85 yo female with history of chronic kidney disease, hyperlipidemia, HTN and TIA who is here today as a new consult, referred by Evelina Dun, NP, for evaluation of dyspnea and lower extremity edema. She has been on Lasix in primary care. No known cardiac issues. No chest pain, palpitations. She has had lower extremity edema for several months. Much improved on increased dose of Lasix recently during her primary care visit. (Now Lasix 40 mg BID). She has never been told that she had atrial fibrillation. She is now wearing supplemental O2 continuously. No palpitations.   Primary Care Physician: Sharion Balloon, FNP   Past Medical History:  Diagnosis Date  . Allergy   . Arthritis of knee   . Cataract   . Cholelithiases   . Chronic kidney disease   . Endometrial polyp 7/98   2 degree polyps   . Genu valgum   . Gout   . Hyperlipidemia   . Hypertension   . Left knee pain   . Osteoarthritis   . Osteoporosis   . Postmenopausal bleeding   . Recurrent UTI   . Renal abscess, right   . Shoulder fracture, left   . TIA (transient ischemic attack)   . Ureteral calculi   . URI (upper respiratory infection)     Past Surgical History:  Procedure Laterality Date  . EYE SURGERY    . HERNIA REPAIR    . HYSTEROSCOPY , FRACTIONAL D&C  7/98/  . [place]  . LEFT CATARACT SURGERY  10/99  . RIGHT CATARACT SURGERY  7/99   Dr. Dolores Lory   . RIGHT HEMINEPHRECTOMY FOR ABSCESS  2/98  . RIGHT NEPHRELITHOTOMY  1982    Current Outpatient Medications  Medication Sig Dispense Refill  . amLODipine (NORVASC) 5 MG tablet Take 1 tablet (5 mg total) by mouth daily. 90 tablet 2  . benazepril (LOTENSIN) 40 MG tablet Take 1 tablet (40 mg total) by mouth daily. 90 tablet 2  . Calcium-Magnesium-Vitamin D (CALCIUM 1200+D3 PO) Take by mouth.    . carvedilol (COREG) 6.25 MG tablet Take 1  tablet (6.25 mg total) by mouth 2 (two) times daily with a meal. 180 tablet 3  . furosemide (LASIX) 40 MG tablet Take 40 mg by mouth 2 (two) times daily.    . meloxicam (MOBIC) 7.5 MG tablet Take 1 tablet (7.5 mg total) by mouth daily. 90 tablet 4  . Misc Natural Products (GNP GLUCOSAME CHONDROITIN DS) TABS Take by mouth.    . Multiple Vitamins-Minerals (CENTRUM SILVER ADULT 50+) TABS Take 1 tablet by mouth daily.    . Omega-3 Fatty Acids (FISH OIL) 1000 MG CAPS Take 1 capsule by mouth 2 (two) times daily.    . raloxifene (EVISTA) 60 MG tablet Take 1 tablet (60 mg total) by mouth daily. 30 tablet 5  . vitamin E 200 UNIT capsule Take 200 Units by mouth daily.     No current facility-administered medications for this visit.     Allergies  Allergen Reactions  . Sulfa Antibiotics Nausea And Vomiting  . Amoxicillin   . Nitrofurantoin Monohyd Macro     Social History   Socioeconomic History  . Marital status: Widowed    Spouse name: Not on file  . Number of children: Not on file  . Years of education: Not on file  . Highest education level: Not on file  Occupational History  . Not on file  Social Needs  . Financial resource strain: Not on file  . Food insecurity    Worry: Not on file    Inability: Not on file  . Transportation needs    Medical: Not on file    Non-medical: Not on file  Tobacco Use  . Smoking status: Former Smoker    Types: Cigarettes    Quit date: 11/20/1944    Years since quitting: 74.5  . Smokeless tobacco: Never Used  Substance and Sexual Activity  . Alcohol use: No  . Drug use: No  . Sexual activity: Never  Lifestyle  . Physical activity    Days per week: Not on file    Minutes per session: Not on file  . Stress: Not on file  Relationships  . Social Herbalist on phone: Not on file    Gets together: Not on file    Attends religious service: Not on file    Active member of club or organization: Not on file    Attends meetings of clubs or  organizations: Not on file    Relationship status: Not on file  . Intimate partner violence    Fear of current or ex partner: Not on file    Emotionally abused: Not on file    Physically abused: Not on file    Forced sexual activity: Not on file  Other Topics Concern  . Not on file  Social History Narrative   WIDOWED   3 CHILDREN 1 DECEASED    Family History  Problem Relation Age of Onset  . Stroke Maternal Grandmother   . Hypertension Maternal Grandmother   . Diabetes Maternal Grandmother   . Pneumonia Father   . Coronary artery disease Paternal Grandfather   . Heart defect Son     Review of Systems:  As stated in the HPI and otherwise negative.   BP 140/66   Pulse 90   Ht 4\' 9"  (1.448 m)   Wt 143 lb (64.9 kg)   SpO2 98%   BMI 30.94 kg/m   Physical Examination: General: Well developed, well nourished, NAD  HEENT: OP clear, mucus membranes moist  SKIN: warm, dry. No rashes. Neuro: No focal deficits  Musculoskeletal: Muscle strength 5/5 all ext  Psychiatric: Mood and affect normal  Neck: No JVD, no carotid bruits, no thyromegaly, no lymphadenopathy.  Lungs:Clear bilaterally, no wheezes, rhonci, crackles Cardiovascular: Irreg irreg. No murmurs, gallops or rubs. Abdomen:Soft. Bowel sounds present. Non-tender.  Extremities: Trace bilateral lower extremity edema. Pulses are 2 + in the bilateral DP/PT.  EKG:  EKG is ordered today. The ekg ordered today demonstrates Atrial fib, rate 90 bpm  Recent Labs: 05/08/2019: ALT 14; BNP 165.7; Hemoglobin 14.0; Platelets 265 05/12/2019: BUN 26; Creatinine, Ser 0.98; Potassium 4.0; Sodium 143   Lipid Panel    Component Value Date/Time   CHOL 137 01/01/2019 1500   TRIG 58 01/01/2019 1500   HDL 44 01/01/2019 1500   CHOLHDL 3.1 01/01/2019 1500   CHOLHDL 3.4 06/05/2013 1007   VLDL 18 06/05/2013 1007   LDLCALC 81 01/01/2019 1500     Wt Readings from Last 3 Encounters:  05/26/19 143 lb (64.9 kg)  05/12/19 143 lb (64.9 kg)   05/08/19 143 lb 12.8 oz (65.2 kg)     Other studies Reviewed: Additional studies/ records that were reviewed today include: office notes. Review of the above records demonstrates:   Assessment and Plan:   1. Congestive  heart failure; Overall improved on higher dose of Lasix. Minimal LE edema. Presumed to be diastolic CHF. Will arrange echo to assess LV systolic function. Will continue Lasix 40 mg BID  2. Atrial fibrillation: Rate controlled. She has never had this documented before. CHADS VASC score 7. We discussed long term anti-coagulation. She is not sure that she would like to be on a blood thinner. I reviewed the risk of stroke with atrial fib. She will discuss with family and call back if she elects to starts anti-coagulation.   Current medicines are reviewed at length with the patient today.  The patient does not have concerns regarding medicines.  The following changes have been made:  no change  Labs/ tests ordered today include:   Orders Placed This Encounter  Procedures  . ECHOCARDIOGRAM COMPLETE     Disposition:   FU with me or office APP in 3 months   Signed, Lauree Chandler, MD 05/26/2019 11:44 AM    [facility] [address], [place], [place]  [address] Phone: [phone]; Fax: [phone]

## 2019-05-26 NOTE — Telephone Encounter (Signed)
  Grandson returning call

## 2019-06-04 ENCOUNTER — Other Ambulatory Visit: Payer: Self-pay

## 2019-06-04 ENCOUNTER — Ambulatory Visit (HOSPITAL_COMMUNITY): Payer: Medicare Other | Attending: Cardiology

## 2019-06-04 DIAGNOSIS — R0602 Shortness of breath: Secondary | ICD-10-CM | POA: Insufficient documentation

## 2019-06-05 ENCOUNTER — Telehealth: Payer: Self-pay | Admitting: *Deleted

## 2019-06-05 DIAGNOSIS — Z7901 Long term (current) use of anticoagulants: Secondary | ICD-10-CM

## 2019-06-05 NOTE — Telephone Encounter (Signed)
-----   Message from Burnell Blanks, MD sent at 06/05/2019  9:07 AM EDT ----- Her echo is overall normal. I suspect that her heart muscle has become more stiff over time and does not relax well thus contributing to lower ext edema. Can we let them know? Also check and see if she has given any more thought to going on a blood thinner for her atrial fib? Thanks, chris

## 2019-06-05 NOTE — Telephone Encounter (Signed)
I spoke with pt's daughter and reviewed echo results with her.   Daughter reports pt would like to start blood thinner.  She also reports pt has a tooth that is bothering her and they are planning to contact dentist to have this evaluated. I told her if tooth needed to be removed we usually have patients hold blood thinner for a few days prior to extraction if required by dentist.

## 2019-06-06 MED ORDER — APIXABAN 5 MG PO TABS: 5.0000 mg | ORAL_TABLET | Freq: Two times a day (BID) | ORAL | 5 refills | Status: DC

## 2019-06-06 NOTE — Telephone Encounter (Signed)
Patient is >85 years old, but weight is >60kg and scr is <1.5 Weight 65kg Scr 0.98 on 05/12/2019 Patient qualifies for 5mg  BID  Called and spoke with patients daughter Paige Ferguson. She was educated on why patient is being started on Elqiuis, s/sx of bleeding, to avoid NSAIDs (use meloxicam sparingly).  Rx sent into pharmacy along with free 30 day coupon.  Will check scr and CBC at appointment with Dr. Angelena Form in October. Orders placed and appt made.

## 2019-06-06 NOTE — Telephone Encounter (Signed)
Pat, I think we should start Eliquis. I will include our PharmDs on this to help with dosing. She is 85 years old but her creatinine is normal (GFR 49) and her weight is 65 kg. She is borderline for the 5 mg BID dosing.

## 2019-06-09 NOTE — Telephone Encounter (Signed)
Thank you for handling this. I appreciate it. Gerald Stabs

## 2019-06-12 DIAGNOSIS — R0602 Shortness of breath: Secondary | ICD-10-CM | POA: Diagnosis not present

## 2019-06-12 DIAGNOSIS — I509 Heart failure, unspecified: Secondary | ICD-10-CM | POA: Diagnosis not present

## 2019-06-12 DIAGNOSIS — R609 Edema, unspecified: Secondary | ICD-10-CM | POA: Diagnosis not present

## 2019-06-25 ENCOUNTER — Other Ambulatory Visit: Payer: Self-pay

## 2019-06-26 ENCOUNTER — Ambulatory Visit (INDEPENDENT_AMBULATORY_CARE_PROVIDER_SITE_OTHER): Payer: Medicare Other | Admitting: Family

## 2019-06-26 ENCOUNTER — Ambulatory Visit (INDEPENDENT_AMBULATORY_CARE_PROVIDER_SITE_OTHER): Payer: Medicare Other

## 2019-06-26 ENCOUNTER — Encounter: Payer: Self-pay | Admitting: Family

## 2019-06-26 VITALS — BP 137/86 | HR 72 | Temp 98.4°F | Ht <= 58 in | Wt 140.0 lb

## 2019-06-26 DIAGNOSIS — R05 Cough: Secondary | ICD-10-CM | POA: Diagnosis not present

## 2019-06-26 DIAGNOSIS — E8881 Metabolic syndrome: Secondary | ICD-10-CM

## 2019-06-26 DIAGNOSIS — R0602 Shortness of breath: Secondary | ICD-10-CM

## 2019-06-26 DIAGNOSIS — M170 Bilateral primary osteoarthritis of knee: Secondary | ICD-10-CM

## 2019-06-26 DIAGNOSIS — E669 Obesity, unspecified: Secondary | ICD-10-CM

## 2019-06-26 DIAGNOSIS — I509 Heart failure, unspecified: Secondary | ICD-10-CM | POA: Diagnosis not present

## 2019-06-26 DIAGNOSIS — I4891 Unspecified atrial fibrillation: Secondary | ICD-10-CM

## 2019-06-26 DIAGNOSIS — E782 Mixed hyperlipidemia: Secondary | ICD-10-CM

## 2019-06-26 DIAGNOSIS — R350 Frequency of micturition: Secondary | ICD-10-CM | POA: Diagnosis not present

## 2019-06-26 DIAGNOSIS — I10 Essential (primary) hypertension: Secondary | ICD-10-CM | POA: Diagnosis not present

## 2019-06-26 LAB — URINALYSIS, COMPLETE
Bilirubin, UA: NEGATIVE
Glucose, UA: NEGATIVE
Ketones, UA: NEGATIVE
Leukocytes,UA: NEGATIVE
Nitrite, UA: NEGATIVE
Protein,UA: NEGATIVE
RBC, UA: NEGATIVE
Specific Gravity, UA: 1.02 (ref 1.005–1.030)
Urobilinogen, Ur: 0.2 mg/dL (ref 0.2–1.0)
pH, UA: 8.5 — ABNORMAL HIGH (ref 5.0–7.5)

## 2019-06-26 LAB — MICROSCOPIC EXAMINATION
RBC: NONE SEEN /hpf (ref 0–2)
Renal Epithel, UA: NONE SEEN /hpf

## 2019-06-26 IMAGING — DX CHEST - 2 VIEW
2 series · 2 of 2 positions shown · non-contrast
Comparison: [DATE]

CLINICAL DATA: Shortness of breath and cough

EXAM:
CHEST - 2 VIEW

[chest ap]
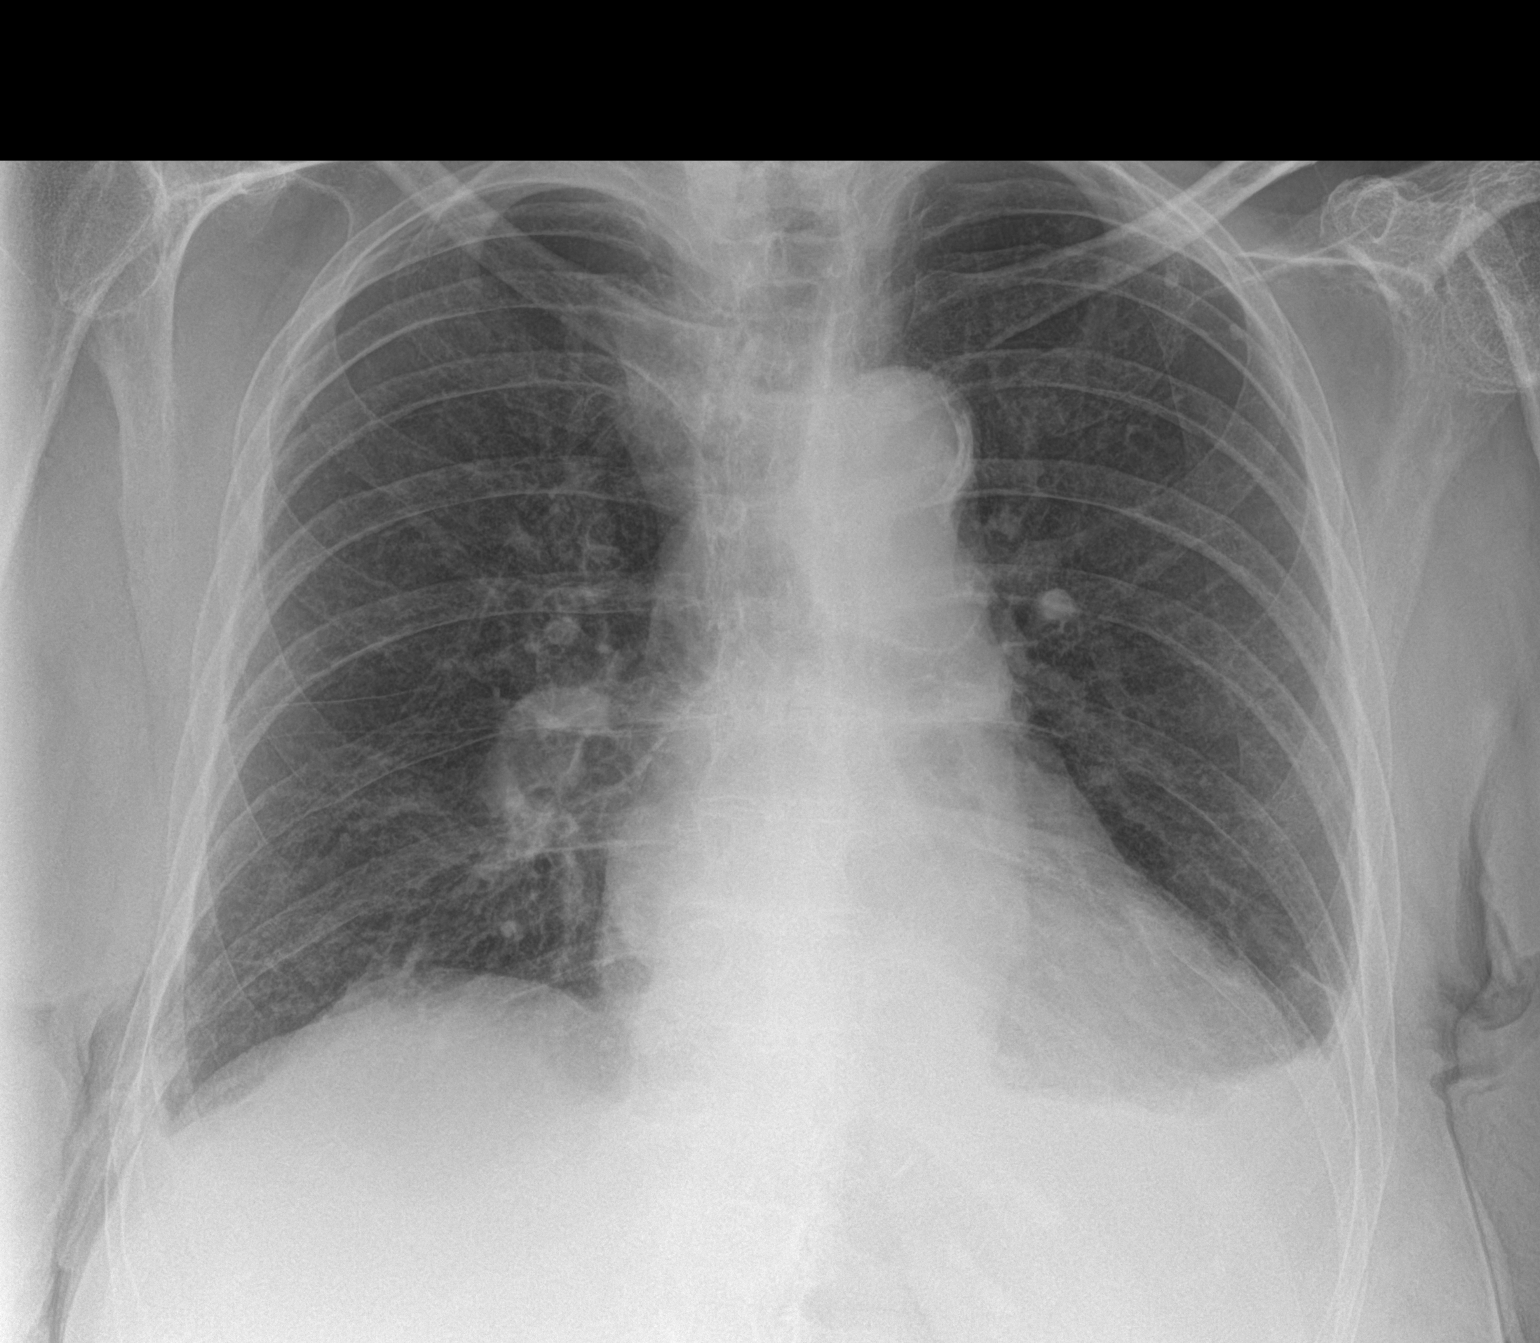

[chest lat]
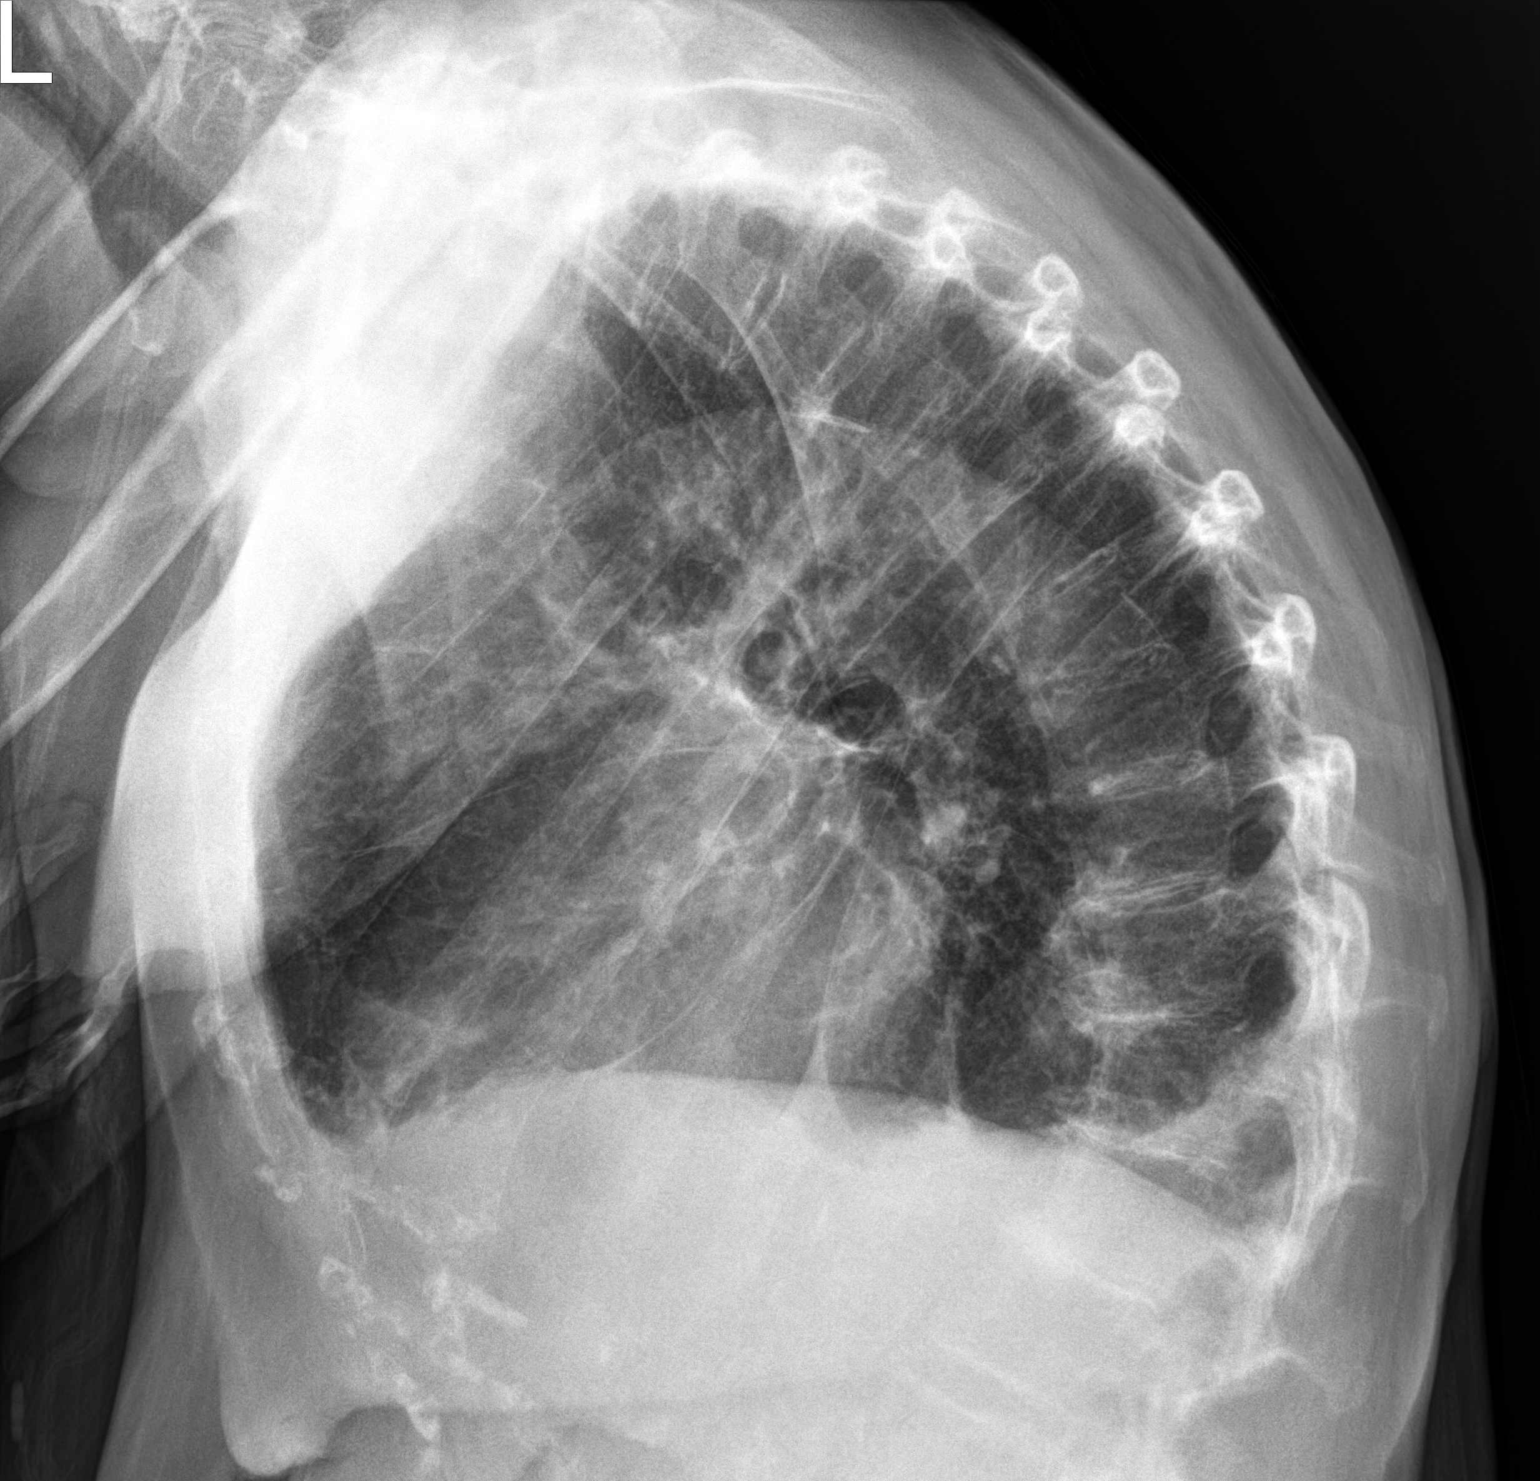

[2 of 2 positions shown; findings below may reference images not displayed]

FINDINGS: Cardiac shadow is stable. Aortic calcifications are again seen. The
lungs are well aerated bilaterally. Minimal pleural effusion is
again seen on the left stable from the prior study. No focal
infiltrate or sizable effusion is seen. Calcified granulomas are
noted on the left. Degenerative changes of the thoracic spine are
seen.
IMPRESSION: Chronic changes without acute abnormality.

## 2019-06-26 NOTE — Progress Notes (Signed)
Subjective:    Patient ID: Paige Ferguson, female    DOB: May 03, 1921, 85 y.o.   MRN: [id number]  Chief Complaint  Patient presents with  . Medical Management of Chronic Issues   PT presents to the office today for chronic follow up. PT is currently on 3L of O2 continuous. She saw the Cardiologists on 05/20/19 and diagnosed her with A Fib and CHF. She reports her SOB has greatly improved since her fluid has improved.   She had an ECHO on 60-65%. She is currently taking Eliquis 5 mg daily.  Hypertension This is a chronic problem. The current episode started more than 1 year ago. The problem has been resolved since onset. The problem is controlled. Associated symptoms include malaise/fatigue, peripheral edema and shortness of breath (at times). Pertinent negatives include no headaches. Risk factors for coronary artery disease include dyslipidemia, obesity and sedentary lifestyle. The current treatment provides moderate improvement. Hypertensive end-organ damage includes CAD/MI and heart failure.  Arthritis Presents for follow-up visit. She complains of pain. The symptoms have been stable. Affected locations include the right knee, left knee and left ankle. Her pain is at a severity of 3/10.  Hyperlipidemia This is a chronic problem. The current episode started more than 1 year ago. Associated symptoms include shortness of breath (at times).  Urinary Frequency  Associated symptoms include frequency.  Metabolic Syndrome PT is not acitve, but does take all of medications daily.     Review of Systems  Constitutional: Positive for malaise/fatigue.  Respiratory: Positive for shortness of breath (at times).   Genitourinary: Positive for frequency.  Musculoskeletal: Positive for arthritis.  Neurological: Negative for headaches.  All other systems reviewed and are negative.      Objective:   Physical Exam Vitals signs reviewed.  Constitutional:      General: She is not in acute distress.   Appearance: She is well-developed.  HENT:     Head: Normocephalic and atraumatic.     Right Ear: Tympanic membrane normal.     Left Ear: Tympanic membrane normal.  Eyes:     Pupils: Pupils are equal, round, and reactive to light.  Neck:     Musculoskeletal: Normal range of motion and neck supple.     Thyroid: No thyromegaly.  Cardiovascular:     Rate and Rhythm: Normal rate and regular rhythm.     Heart sounds: Normal heart sounds. No murmur.  Pulmonary:     Effort: Pulmonary effort is normal. No respiratory distress.     Breath sounds: Examination of the left-middle field reveals rales. Examination of the left-lower field reveals rales. Rales present. No wheezing.     Comments: 3L of O2 Abdominal:     General: Bowel sounds are normal. There is no distension.     Palpations: Abdomen is soft.     Tenderness: There is no abdominal tenderness.  Musculoskeletal: Normal range of motion.        General: No tenderness.     Right lower leg: Edema (3+) present.     Left lower leg: Edema (3+) present.  Skin:    General: Skin is warm and dry.  Neurological:     Mental Status: She is alert and oriented to person, place, and time.     Cranial Nerves: No cranial nerve deficit.     Deep Tendon Reflexes: Reflexes are normal and symmetric.  Psychiatric:        Behavior: Behavior normal.        Thought  Content: Thought content normal.        Judgment: Judgment normal.       BP 137/86   Pulse 72   Temp 98.4 F (36.9 C) (Temporal)   Ht 4' 9" (1.448 m)   Wt 140 lb (63.5 kg)   BMI 30.30 kg/m      Assessment & Plan:  Paige Ferguson comes in today with chief complaint of Medical Management of Chronic Issues   Diagnosis and orders addressed:  1. Essential hypertension - CBC with Differential/Platelet - CMP14+EGFR  2. Primary osteoarthritis of both knees - CBC with Differential/Platelet - HUD14+HFWY  3. Metabolic syndrome - CBC with Differential/Platelet - CMP14+EGFR  4.  Obesity (BMI 30-39.9) - CBC with Differential/Platelet - CMP14+EGFR  5. Mixed hyperlipidemia - CBC with Differential/Platelet - CMP14+EGFR  6. Atrial fibrillation, unspecified type ([place]) - CBC with Differential/Platelet - CMP14+EGFR  7. Congestive heart failure, unspecified HF chronicity, unspecified heart failure type ([place]) - CBC with Differential/Platelet - CMP14+EGFR - DG Chest 2 View; Future  8. SOB (shortness of breath) - DG Chest 2 View; Future  9. Urinary frequency - Urinalysis, Complete - Urine Culture   Labs pending Health Maintenance reviewed Diet and exercise encouraged  Follow up plan: 4 months, keep Cardiologists follow up   Evelina Dun, FNP

## 2019-06-26 NOTE — Patient Instructions (Signed)
Urinary Frequency, Adult Urinary frequency means urinating more often than usual. You may urinate every 1-2 hours even though you drink a normal amount of fluid and do not have a bladder infection or condition. Although you urinate more often than normal, the total amount of urine produced in a day is normal. With urinary frequency, you may have an urgent need to urinate often. The stress and anxiety of needing to find a bathroom quickly can make this urge worse. This condition may go away on its own or you may need treatment at home. Home treatment may include bladder training, exercises, taking medicines, or making changes to your diet. Follow these instructions at home: Bladder health   Keep a bladder diary if told by your health care provider. Keep track of: ? What you eat and drink. ? How often you urinate. ? How much you urinate.  Follow a bladder training program if told by your health care provider. This may include: ? Learning to delay going to the bathroom. ? Double urinating (voiding). This helps if you are not completely emptying your bladder. ? Scheduled voiding.  Do Kegel exercises as told by your health care provider. Kegel exercises strengthen the muscles that help control urination, which may help the condition. Eating and drinking  If told by your health care provider, make diet changes, such as: ? Avoiding caffeine. ? Drinking fewer fluids, especially alcohol. ? Not drinking in the evening. ? Avoiding foods or drinks that may irritate the bladder. These include coffee, tea, soda, artificial sweeteners, citrus, tomato-based foods, and chocolate. ? Eating foods that help prevent or ease constipation. Constipation can make this condition worse. Your health care provider may recommend that you:  Drink enough fluid to keep your urine pale yellow.  Take over-the-counter or prescription medicines.  Eat foods that are high in fiber, such as beans, whole grains, and fresh  fruits and vegetables.  Limit foods that are high in fat and processed sugars, such as fried or sweet foods. General instructions  Take over-the-counter and prescription medicines only as told by your health care provider.  Keep all follow-up visits as told by your health care provider. This is important. Contact a health care provider if:  You start urinating more often.  You feel pain or irritation when you urinate.  You notice blood in your urine.  Your urine looks cloudy.  You develop a fever.  You begin vomiting. Get help right away if:  You are unable to urinate. Summary  Urinary frequency means urinating more often than usual. With urinary frequency, you may urinate every 1-2 hours even though you drink a normal amount of fluid and do not have a bladder infection or other bladder condition.  Your health care provider may recommend that you keep a bladder diary, follow a bladder training program, or make dietary changes.  If told by your health care provider, do Kegel exercises to strengthen the muscles that help control urination.  Take over-the-counter and prescription medicines only as told by your health care provider.  Contact a health care provider if your symptoms do not improve or get worse. This information is not intended to replace advice given to you by your health care provider. Make sure you discuss any questions you have with your health care provider. Document Released: 09/02/2009 Document Revised: 05/16/2018 Document Reviewed: 05/16/2018 Elsevier Patient Education  2020 [employer].

## 2019-06-27 LAB — CMP14+EGFR
ALT: 13 IU/L (ref 0–32)
AST: 25 IU/L (ref 0–40)
Albumin/Globulin Ratio: 1.7 (ref 1.2–2.2)
Albumin: 4 g/dL (ref 3.5–4.6)
Alkaline Phosphatase: 64 IU/L (ref 39–117)
BUN/Creatinine Ratio: 20 (ref 12–28)
BUN: 17 mg/dL (ref 10–36)
Bilirubin Total: 1 mg/dL (ref 0.0–1.2)
CO2: 32 mmol/L — ABNORMAL HIGH (ref 20–29)
Calcium: 9.4 mg/dL (ref 8.7–10.3)
Chloride: 98 mmol/L (ref 96–106)
Creatinine, Ser: 0.86 mg/dL (ref 0.57–1.00)
GFR calc Af Amer: 66 mL/min/{1.73_m2} (ref >59)
GFR calc non Af Amer: 57 mL/min/{1.73_m2} — ABNORMAL LOW (ref >59)
Globulin, Total: 2.3 g/dL (ref 1.5–4.5)
Glucose: 95 mg/dL (ref 65–99)
Potassium: 3.8 mmol/L (ref 3.5–5.2)
Sodium: 143 mmol/L (ref 134–144)
Total Protein: 6.3 g/dL (ref 6.0–8.5)

## 2019-06-27 LAB — CBC WITH DIFFERENTIAL/PLATELET
Basophils Absolute: 0 10*3/uL (ref 0.0–0.2)
Basos: 1 %
EOS (ABSOLUTE): 0.2 10*3/uL (ref 0.0–0.4)
Eos: 2 %
Hematocrit: 36.9 % (ref 34.0–46.6)
Hemoglobin: 12.6 g/dL (ref 11.1–15.9)
Immature Grans (Abs): 0 10*3/uL (ref 0.0–0.1)
Immature Granulocytes: 0 %
Lymphocytes Absolute: 2.2 10*3/uL (ref 0.7–3.1)
Lymphs: 29 %
MCH: 32.7 pg (ref 26.6–33.0)
MCHC: 34.1 g/dL (ref 31.5–35.7)
MCV: 96 fL (ref 79–97)
Monocytes Absolute: 1.3 10*3/uL — ABNORMAL HIGH (ref 0.1–0.9)
Monocytes: 17 %
Neutrophils Absolute: 3.9 10*3/uL (ref 1.4–7.0)
Neutrophils: 51 %
Platelets: 239 10*3/uL (ref 150–450)
RBC: 3.85 x10E6/uL (ref 3.77–5.28)
RDW: 12.9 % (ref 11.7–15.4)
WBC: 7.7 10*3/uL (ref 3.4–10.8)

## 2019-06-27 LAB — URINE CULTURE

## 2019-07-04 DIAGNOSIS — I509 Heart failure, unspecified: Secondary | ICD-10-CM | POA: Diagnosis not present

## 2019-07-04 DIAGNOSIS — R0602 Shortness of breath: Secondary | ICD-10-CM | POA: Diagnosis not present

## 2019-07-13 DIAGNOSIS — R0602 Shortness of breath: Secondary | ICD-10-CM | POA: Diagnosis not present

## 2019-07-13 DIAGNOSIS — R609 Edema, unspecified: Secondary | ICD-10-CM | POA: Diagnosis not present

## 2019-07-13 DIAGNOSIS — I509 Heart failure, unspecified: Secondary | ICD-10-CM | POA: Diagnosis not present

## 2019-07-16 ENCOUNTER — Telehealth: Payer: Self-pay | Admitting: *Deleted

## 2019-07-16 MED ORDER — FUROSEMIDE 40 MG PO TABS: 40.0000 mg | ORAL_TABLET | Freq: Two times a day (BID) | ORAL | 1 refills | Status: DC

## 2019-07-16 NOTE — Telephone Encounter (Signed)
Prescription sent to pharmacy.

## 2019-07-16 NOTE — Addendum Note (Signed)
Addended by: Evelina Dun A on: 07/16/2019 12:32 PM   Modules accepted: Orders

## 2019-07-16 NOTE — Telephone Encounter (Signed)
Fax from [facility] Furosemide 40 mg Rx w/ pharmacy is 1.5 tabs daily Pt states last visit increased to 1 Qam & 1 Qhs Please clarify and send new Rx if appropriate

## 2019-07-21 ENCOUNTER — Telehealth: Payer: Self-pay | Admitting: Family

## 2019-07-21 NOTE — Telephone Encounter (Signed)
Please advise if script for medicine could be sent in.

## 2019-07-22 NOTE — Telephone Encounter (Signed)
Paige Ferguson, see below.  Can you please take care of this since Dr. Warrick Parisian is off today.

## 2019-07-23 MED ORDER — CLOTRIMAZOLE 1 % EX CREA: 1.0000 "application " | TOPICAL_CREAM | Freq: Two times a day (BID) | CUTANEOUS | 0 refills | Status: DC

## 2019-07-23 NOTE — Telephone Encounter (Signed)
Patient notified

## 2019-07-23 NOTE — Telephone Encounter (Signed)
Please let the patient know that I have sent the cream in for her that she can use twice a day, she can continue to use A&E as well, if it still not improved then she may need to come in and shows what is going on. Caryl Pina, MD [facility] 07/23/2019, 2:17 PM

## 2019-07-23 NOTE — Telephone Encounter (Signed)
Aware.  Script sent in.

## 2019-08-04 DIAGNOSIS — R0602 Shortness of breath: Secondary | ICD-10-CM | POA: Diagnosis not present

## 2019-08-04 DIAGNOSIS — I509 Heart failure, unspecified: Secondary | ICD-10-CM | POA: Diagnosis not present

## 2019-08-09 ENCOUNTER — Other Ambulatory Visit: Payer: Self-pay | Admitting: Family

## 2019-08-09 DIAGNOSIS — M810 Age-related osteoporosis without current pathological fracture: Secondary | ICD-10-CM

## 2019-08-13 DIAGNOSIS — R609 Edema, unspecified: Secondary | ICD-10-CM | POA: Diagnosis not present

## 2019-08-13 DIAGNOSIS — I509 Heart failure, unspecified: Secondary | ICD-10-CM | POA: Diagnosis not present

## 2019-08-13 DIAGNOSIS — R0602 Shortness of breath: Secondary | ICD-10-CM | POA: Diagnosis not present

## 2019-08-25 ENCOUNTER — Telehealth: Payer: Self-pay | Admitting: Cardiovascular Disease

## 2019-08-25 NOTE — Telephone Encounter (Signed)
New Message  Daughter Sheran Spine is calling in to get approval to accompany patient to her appointment on 08/28/19 at 4 pm with Dr. Angelena Form. States that patient is in a transport chair and is on oxygen and needs assistance and daughter states that she will be there to assist. Please give patient's daughter a call back to assist.

## 2019-08-25 NOTE — Telephone Encounter (Signed)
Called Ms. Saccomanno' daughter Fuller Song phone and left VM that she is permitted to accompany pt to appointment with Dr. Angelena Form on 08/28/19.

## 2019-08-27 ENCOUNTER — Ambulatory Visit (INDEPENDENT_AMBULATORY_CARE_PROVIDER_SITE_OTHER): Payer: Medicare Other

## 2019-08-27 ENCOUNTER — Other Ambulatory Visit: Payer: Self-pay

## 2019-08-27 DIAGNOSIS — Z23 Encounter for immunization: Secondary | ICD-10-CM

## 2019-08-28 ENCOUNTER — Encounter: Payer: Self-pay | Admitting: Cardiovascular Disease

## 2019-08-28 ENCOUNTER — Other Ambulatory Visit: Payer: Medicare Other | Admitting: *Deleted

## 2019-08-28 ENCOUNTER — Ambulatory Visit (INDEPENDENT_AMBULATORY_CARE_PROVIDER_SITE_OTHER): Payer: Medicare Other | Admitting: Cardiovascular Disease

## 2019-08-28 VITALS — BP 132/76 | HR 86 | Ht <= 58 in | Wt 135.0 lb

## 2019-08-28 DIAGNOSIS — I5032 Chronic diastolic (congestive) heart failure: Secondary | ICD-10-CM

## 2019-08-28 DIAGNOSIS — I4819 Other persistent atrial fibrillation: Secondary | ICD-10-CM

## 2019-08-28 DIAGNOSIS — Z7901 Long term (current) use of anticoagulants: Secondary | ICD-10-CM

## 2019-08-28 NOTE — Progress Notes (Signed)
Chief Complaint  Patient presents with  . Follow-up    Atrial fibrillation   History of Present Illness: 85 yo female with history of chronic kidney disease, hyperlipidemia, HTN and TIA who is here today for cardiac follow up. I saw her as a new patient in July 2020. She has been on Lasix in primary care. No known cardiac issues. No chest pain, palpitations. She had lower extremity edema for several months. Much improved on increased dose of Lasix recently during her primary care visit. Her EKG at her first visit with me showed atrial fibrillation. She had no awareness of this and no palpitations at home. She was started on Eliquis. Her Coreg was continued. Echo July 2020 with normal LV systolic function, [id number]. No significant valve disease.   She is here today for follow up. The patient denies any chest pain, dyspnea, palpitations, lower extremity edema, orthopnea, PND, dizziness, near syncope or syncope.   Primary Care Physician: Sharion Balloon, FNP  Past Medical History:  Diagnosis Date  . Allergy   . Arthritis of knee   . Cataract   . Cholelithiases   . Chronic kidney disease   . Endometrial polyp 7/98   2 degree polyps   . Genu valgum   . Gout   . Hyperlipidemia   . Hypertension   . Left knee pain   . Osteoarthritis   . Osteoporosis   . Postmenopausal bleeding   . Recurrent UTI   . Renal abscess, right   . Shoulder fracture, left   . TIA (transient ischemic attack)   . Ureteral calculi   . URI (upper respiratory infection)     Past Surgical History:  Procedure Laterality Date  . EYE SURGERY    . HERNIA REPAIR    . HYSTEROSCOPY , FRACTIONAL D&C  7/98/  . [place]  . LEFT CATARACT SURGERY  10/99  . RIGHT CATARACT SURGERY  7/99   Dr. Dolores Lory   . RIGHT HEMINEPHRECTOMY FOR ABSCESS  2/98  . RIGHT NEPHRELITHOTOMY  1982    Current Outpatient Medications  Medication Sig Dispense Refill  . amLODipine (NORVASC) 5 MG tablet Take 1 tablet (5  mg total) by mouth daily. 90 tablet 2  . apixaban (ELIQUIS) 5 MG TABS tablet Take 1 tablet (5 mg total) by mouth 2 (two) times daily. 60 tablet 5  . benazepril (LOTENSIN) 40 MG tablet Take 1 tablet (40 mg total) by mouth daily. 90 tablet 2  . Calcium-Magnesium-Vitamin D (CALCIUM 1200+D3 PO) Take by mouth.    . carvedilol (COREG) 6.25 MG tablet Take 1 tablet (6.25 mg total) by mouth 2 (two) times daily with a meal. 180 tablet 3  . clotrimazole (CLOTRIMAZOLE ANTI-FUNGAL) 1 % cream Apply 1 application topically 2 (two) times daily. 30 g 0  . furosemide (LASIX) 40 MG tablet Take 1 tablet (40 mg total) by mouth 2 (two) times daily. 60 tablet 1  . meloxicam (MOBIC) 7.5 MG tablet Take 1 tablet (7.5 mg total) by mouth daily. 90 tablet 4  . Misc Natural Products (GNP GLUCOSAME CHONDROITIN DS) TABS Take by mouth.    . Multiple Vitamins-Minerals (CENTRUM SILVER ADULT 50+) TABS Take 1 tablet by mouth daily.    . Omega-3 Fatty Acids (FISH OIL) 1000 MG CAPS Take 1 capsule by mouth 2 (two) times daily.    . raloxifene (EVISTA) 60 MG tablet TAKE 1 TABLET BY MOUTH DAILY. 30 tablet 0  . vitamin E 200 UNIT capsule Take  200 Units by mouth daily.     No current facility-administered medications for this visit.     Allergies  Allergen Reactions  . Sulfa Antibiotics Nausea And Vomiting  . Amoxicillin   . Nitrofurantoin Monohyd Macro     Social History   Socioeconomic History  . Marital status: Widowed    Spouse name: Not on file  . Number of children: Not on file  . Years of education: Not on file  . Highest education level: Not on file  Occupational History  . Not on file  Social Needs  . Financial resource strain: Not on file  . Food insecurity    Worry: Not on file    Inability: Not on file  . Transportation needs    Medical: Not on file    Non-medical: Not on file  Tobacco Use  . Smoking status: Former Smoker    Types: Cigarettes    Quit date: 11/20/1944    Years since quitting: 74.8  .  Smokeless tobacco: Never Used  Substance and Sexual Activity  . Alcohol use: No  . Drug use: No  . Sexual activity: Never  Lifestyle  . Physical activity    Days per week: Not on file    Minutes per session: Not on file  . Stress: Not on file  Relationships  . Social Herbalist on phone: Not on file    Gets together: Not on file    Attends religious service: Not on file    Active member of club or organization: Not on file    Attends meetings of clubs or organizations: Not on file    Relationship status: Not on file  . Intimate partner violence    Fear of current or ex partner: Not on file    Emotionally abused: Not on file    Physically abused: Not on file    Forced sexual activity: Not on file  Other Topics Concern  . Not on file  Social History Narrative   WIDOWED   3 CHILDREN 1 DECEASED    Family History  Problem Relation Age of Onset  . Stroke Maternal Grandmother   . Hypertension Maternal Grandmother   . Diabetes Maternal Grandmother   . Pneumonia Father   . Coronary artery disease Paternal Grandfather   . Heart defect Son     Review of Systems:  As stated in the HPI and otherwise negative.   BP 132/76   Pulse 86   Ht 4\' 9"  (1.448 m)   Wt 135 lb (61.2 kg)   SpO2 99%   BMI 29.21 kg/m   Physical Examination: General: Well developed, well nourished, NAD  HEENT: OP clear, mucus membranes moist  SKIN: warm, dry. No rashes. Neuro: No focal deficits  Musculoskeletal: Muscle strength 5/5 all ext  Psychiatric: Mood and affect normal  Neck: No JVD, no carotid bruits, no thyromegaly, no lymphadenopathy.  Lungs:Clear bilaterally, no wheezes, rhonci, crackles Cardiovascular: Irregular irregular. No murmurs, gallops or rubs. Abdomen:Soft. Bowel sounds present. Non-tender.  Extremities: Trace bilateral lower extremity edema. Pulses are 2 + in the bilateral DP/PT.  EKG:  EKG is not ordered today. The ekg ordered today demonstrates   Echo 06/04/19:  1.  The left ventricle has normal systolic function with an ejection fraction of 60-65%. The cavity size was normal. Left ventricular diastolic Doppler parameters are consistent with impaired relaxation.  2. The right ventricle has normal systolic function. The cavity was normal. There is no increase in  right ventricular wall thickness.  3. Left atrial size was moderately dilated.  4. Mild thickening of the mitral valve leaflet. Mild calcification of the mitral valve leaflet.  5. The inferior vena cava was dilated in size with >50% respiratory variability.  Recent Labs: 05/08/2019: BNP 165.7 06/26/2019: ALT 13; BUN 17; Creatinine, Ser 0.86; Hemoglobin 12.6; Platelets 239; Potassium 3.8; Sodium 143   Lipid Panel    Component Value Date/Time   CHOL 137 01/01/2019 1500   TRIG 58 01/01/2019 1500   HDL 44 01/01/2019 1500   CHOLHDL 3.1 01/01/2019 1500   CHOLHDL 3.4 06/05/2013 1007   VLDL 18 06/05/2013 1007   LDLCALC 81 01/01/2019 1500     Wt Readings from Last 3 Encounters:  08/28/19 135 lb (61.2 kg)  06/26/19 140 lb (63.5 kg)  05/26/19 143 lb (64.9 kg)    Assessment and Plan:   1. Chronic Congestive heart failure; Echo July 2020 with normal LV systolic function. Weight is stable. Continue Lasix.   2. Atrial fibrillation: Rate is controlled today. Will continue Coreg and Eliquis. (Patient is >57 years old, but weight is >60kg and scr is <1.5, Weight 65kg, Scr 0.98 on 05/12/2019, Patient qualifies for 5mg  BID)  Current medicines are reviewed at length with the patient today.  The patient does not have concerns regarding medicines.  The following changes have been made:  no change  Labs/ tests ordered today include:   No orders of the defined types were placed in this encounter.    Disposition:   FU with me in 6 months   Signed, Lauree Chandler, MD 08/28/2019 4:40 PM    [facility] [place], [place], [place]  [address] Phone: [phone]; Fax: [phone]

## 2019-08-28 NOTE — Patient Instructions (Signed)
Medication Instructions:  No changes If you need a refill on your cardiac medications before your next appointment, please call your pharmacy.   Lab work: none If you have labs (blood work) drawn today and your tests are completely normal, you will receive your results only by: . MyChart Message (if you have MyChart) OR . A paper copy in the mail If you have any lab test that is abnormal or we need to change your treatment, we will call you to review the results.  Testing/Procedures: none  Follow-Up: At [facility], you and your health needs are our priority.  As part of our continuing mission to provide you with exceptional heart care, we have created designated Provider Care Teams.  These Care Teams include your primary Cardiologist (physician) and Advanced Practice Providers (APPs -  Physician Assistants and Nurse Practitioners) who all work together to provide you with the care you need, when you need it. You will need a follow up appointment in 6 months.  Please call our office 2 months in advance to schedule this appointment.  You may see Christopher McAlhany, MD or one of the following Advanced Practice Providers on your designated Care Team:   Brittainy Simmons, PA-C Dayna Dunn, PA-C . Michele Lenze, PA-C  Any Other Special Instructions Will Be Listed Below (If Applicable).    

## 2019-08-29 ENCOUNTER — Telehealth: Payer: Self-pay

## 2019-08-29 LAB — CBC
Hematocrit: 33.6 % — ABNORMAL LOW (ref 34.0–46.6)
Hemoglobin: 11.3 g/dL (ref 11.1–15.9)
MCH: 33 pg (ref 26.6–33.0)
MCHC: 33.6 g/dL (ref 31.5–35.7)
MCV: 98 fL — ABNORMAL HIGH (ref 79–97)
Platelets: 247 10*3/uL (ref 150–450)
RBC: 3.42 x10E6/uL — ABNORMAL LOW (ref 3.77–5.28)
RDW: 12.8 % (ref 11.7–15.4)
WBC: 6.1 10*3/uL (ref 3.4–10.8)

## 2019-08-29 LAB — BASIC METABOLIC PANEL
BUN/Creatinine Ratio: 27 (ref 12–28)
BUN: 25 mg/dL (ref 10–36)
CO2: 31 mmol/L — ABNORMAL HIGH (ref 20–29)
Calcium: 9.4 mg/dL (ref 8.7–10.3)
Chloride: 103 mmol/L (ref 96–106)
Creatinine, Ser: 0.94 mg/dL (ref 0.57–1.00)
GFR calc Af Amer: 59 mL/min/{1.73_m2} — ABNORMAL LOW (ref >59)
GFR calc non Af Amer: 51 mL/min/{1.73_m2} — ABNORMAL LOW (ref >59)
Glucose: 110 mg/dL — ABNORMAL HIGH (ref 65–99)
Potassium: 4.2 mmol/L (ref 3.5–5.2)
Sodium: 148 mmol/L — ABNORMAL HIGH (ref 134–144)

## 2019-08-29 NOTE — Telephone Encounter (Signed)
lmtcb regarding lab work

## 2019-09-02 ENCOUNTER — Telehealth: Payer: Self-pay | Admitting: Family

## 2019-09-02 ENCOUNTER — Other Ambulatory Visit: Payer: Self-pay | Admitting: Family

## 2019-09-02 NOTE — Telephone Encounter (Signed)
Needs face to face appt

## 2019-09-03 DIAGNOSIS — R0602 Shortness of breath: Secondary | ICD-10-CM | POA: Diagnosis not present

## 2019-09-03 DIAGNOSIS — I509 Heart failure, unspecified: Secondary | ICD-10-CM | POA: Diagnosis not present

## 2019-09-05 NOTE — Telephone Encounter (Signed)
LMOVM pt is needed F2F visit for documentation for DME supplies

## 2019-09-08 ENCOUNTER — Other Ambulatory Visit: Payer: Self-pay | Admitting: Family

## 2019-09-08 DIAGNOSIS — M810 Age-related osteoporosis without current pathological fracture: Secondary | ICD-10-CM

## 2019-09-12 DIAGNOSIS — I509 Heart failure, unspecified: Secondary | ICD-10-CM | POA: Diagnosis not present

## 2019-09-12 DIAGNOSIS — R609 Edema, unspecified: Secondary | ICD-10-CM | POA: Diagnosis not present

## 2019-09-12 DIAGNOSIS — R0602 Shortness of breath: Secondary | ICD-10-CM | POA: Diagnosis not present

## 2019-09-18 ENCOUNTER — Other Ambulatory Visit: Payer: Self-pay

## 2019-09-19 ENCOUNTER — Ambulatory Visit (INDEPENDENT_AMBULATORY_CARE_PROVIDER_SITE_OTHER): Payer: Medicare Other | Admitting: Family

## 2019-09-19 ENCOUNTER — Encounter: Payer: Self-pay | Admitting: Family

## 2019-09-19 VITALS — BP 137/88 | HR 108 | Temp 98.0°F | Ht <= 58 in | Wt 143.0 lb

## 2019-09-19 DIAGNOSIS — I509 Heart failure, unspecified: Secondary | ICD-10-CM | POA: Diagnosis not present

## 2019-09-19 DIAGNOSIS — R350 Frequency of micturition: Secondary | ICD-10-CM

## 2019-09-19 DIAGNOSIS — M810 Age-related osteoporosis without current pathological fracture: Secondary | ICD-10-CM

## 2019-09-19 DIAGNOSIS — M170 Bilateral primary osteoarthritis of knee: Secondary | ICD-10-CM

## 2019-09-19 DIAGNOSIS — Z9181 History of falling: Secondary | ICD-10-CM | POA: Diagnosis not present

## 2019-09-19 DIAGNOSIS — I10 Essential (primary) hypertension: Secondary | ICD-10-CM | POA: Diagnosis not present

## 2019-09-19 DIAGNOSIS — Z9981 Dependence on supplemental oxygen: Secondary | ICD-10-CM

## 2019-09-19 DIAGNOSIS — M1712 Unilateral primary osteoarthritis, left knee: Secondary | ICD-10-CM

## 2019-09-19 MED ORDER — AMLODIPINE BESYLATE 5 MG PO TABS: 5.0000 mg | ORAL_TABLET | Freq: Every day | ORAL | 2 refills | Status: DC

## 2019-09-19 MED ORDER — APIXABAN 5 MG PO TABS: 5.0000 mg | ORAL_TABLET | Freq: Two times a day (BID) | ORAL | 5 refills | Status: DC

## 2019-09-19 MED ORDER — MELOXICAM 7.5 MG PO TABS: 7.5000 mg | ORAL_TABLET | Freq: Every day | ORAL | 4 refills | Status: DC

## 2019-09-19 MED ORDER — BENAZEPRIL HCL 40 MG PO TABS: 40.0000 mg | ORAL_TABLET | Freq: Every day | ORAL | 2 refills | Status: DC

## 2019-09-19 MED ORDER — RALOXIFENE HCL 60 MG PO TABS: 60.0000 mg | ORAL_TABLET | Freq: Every day | ORAL | 2 refills | Status: DC

## 2019-09-19 MED ORDER — CARVEDILOL 6.25 MG PO TABS: 6.2500 mg | ORAL_TABLET | Freq: Two times a day (BID) | ORAL | 3 refills | Status: DC

## 2019-09-19 MED ORDER — FUROSEMIDE 40 MG PO TABS: 40.0000 mg | ORAL_TABLET | Freq: Two times a day (BID) | ORAL | 3 refills | Status: DC

## 2019-09-19 NOTE — Patient Instructions (Signed)

## 2019-09-19 NOTE — Progress Notes (Signed)
   Subjective:    Patient ID: Paige Ferguson, female    DOB: [birth year], 85 y.o.   MRN: [id number]  Chief Complaint  Patient presents with  . face to face for bedside commode    HPI PT presents to the office today for face to face for a bedside commode. She is on 3L of O2 at home and has CHF. She wakes up 2-3 times a night to urinate. She is currently walking to the bathroom now and using a walker. She is a fall risk.    Review of Systems  Genitourinary: Positive for urgency.  All other systems reviewed and are negative.      Objective:   Physical Exam Vitals signs reviewed.  Constitutional:      General: She is not in acute distress.    Appearance: She is well-developed.  HENT:     Head: Normocephalic and atraumatic.  Eyes:     Pupils: Pupils are equal, round, and reactive to light.  Neck:     Musculoskeletal: Normal range of motion and neck supple.     Thyroid: No thyromegaly.  Cardiovascular:     Rate and Rhythm: Normal rate and regular rhythm.     Heart sounds: Murmur present.  Pulmonary:     Effort: Pulmonary effort is normal. No respiratory distress.     Breath sounds: Normal breath sounds. No wheezing.     Comments: 3L of O2 Abdominal:     General: Bowel sounds are normal. There is no distension.     Palpations: Abdomen is soft.     Tenderness: There is no abdominal tenderness.  Musculoskeletal: Normal range of motion.        General: No tenderness.     Right lower leg: Edema (3+) present.     Left lower leg: Edema (3+) present.  Skin:    General: Skin is warm and dry.  Neurological:     Mental Status: She is alert and oriented to person, place, and time.     Cranial Nerves: No cranial nerve deficit.     Motor: Weakness present.     Deep Tendon Reflexes: Reflexes are normal and symmetric.  Psychiatric:        Behavior: Behavior normal.        Thought Content: Thought content normal.        Judgment: Judgment normal.     BP 137/88   Pulse (!) 108    Temp 98 F (36.7 C) (Temporal)   Ht 4\' 9"  (1.448 m)   Wt 143 lb (64.9 kg)   SpO2 98% Comment: On oxygen  BMI 30.94 kg/m      Assessment & Plan:  Paige Ferguson comes in today with chief complaint of face to face for bedside commode   Diagnosis and orders addressed:  1. Urinary frequency  - DME Bedside commode  2. At high risk for injury related to fall - DME Bedside commode  3. Congestive heart failure, unspecified HF chronicity, unspecified heart failure type ([place]) - DME Bedside commode  4. On home oxygen therapy - DME Bedside commode   Evelina Dun, FNP

## 2019-09-22 DIAGNOSIS — I509 Heart failure, unspecified: Secondary | ICD-10-CM | POA: Diagnosis not present

## 2019-09-23 DIAGNOSIS — D0439 Carcinoma in situ of skin of other parts of face: Secondary | ICD-10-CM | POA: Diagnosis not present

## 2019-09-23 DIAGNOSIS — D229 Melanocytic nevi, unspecified: Secondary | ICD-10-CM | POA: Diagnosis not present

## 2019-10-04 DIAGNOSIS — I509 Heart failure, unspecified: Secondary | ICD-10-CM | POA: Diagnosis not present

## 2019-10-04 DIAGNOSIS — R0602 Shortness of breath: Secondary | ICD-10-CM | POA: Diagnosis not present

## 2019-10-13 DIAGNOSIS — I509 Heart failure, unspecified: Secondary | ICD-10-CM | POA: Diagnosis not present

## 2019-10-13 DIAGNOSIS — R609 Edema, unspecified: Secondary | ICD-10-CM | POA: Diagnosis not present

## 2019-10-13 DIAGNOSIS — R0602 Shortness of breath: Secondary | ICD-10-CM | POA: Diagnosis not present

## 2019-10-30 DIAGNOSIS — D0439 Carcinoma in situ of skin of other parts of face: Secondary | ICD-10-CM | POA: Diagnosis not present

## 2019-11-03 DIAGNOSIS — R0602 Shortness of breath: Secondary | ICD-10-CM | POA: Diagnosis not present

## 2019-11-03 DIAGNOSIS — I509 Heart failure, unspecified: Secondary | ICD-10-CM | POA: Diagnosis not present

## 2019-11-12 DIAGNOSIS — I509 Heart failure, unspecified: Secondary | ICD-10-CM | POA: Diagnosis not present

## 2019-11-12 DIAGNOSIS — R0602 Shortness of breath: Secondary | ICD-10-CM | POA: Diagnosis not present

## 2019-11-12 DIAGNOSIS — R609 Edema, unspecified: Secondary | ICD-10-CM | POA: Diagnosis not present

## 2019-12-04 DIAGNOSIS — I509 Heart failure, unspecified: Secondary | ICD-10-CM | POA: Diagnosis not present

## 2019-12-04 DIAGNOSIS — R0602 Shortness of breath: Secondary | ICD-10-CM | POA: Diagnosis not present

## 2019-12-13 DIAGNOSIS — R609 Edema, unspecified: Secondary | ICD-10-CM | POA: Diagnosis not present

## 2019-12-13 DIAGNOSIS — I509 Heart failure, unspecified: Secondary | ICD-10-CM | POA: Diagnosis not present

## 2019-12-13 DIAGNOSIS — R0602 Shortness of breath: Secondary | ICD-10-CM | POA: Diagnosis not present

## 2019-12-25 ENCOUNTER — Encounter: Payer: Self-pay | Admitting: Family

## 2019-12-25 ENCOUNTER — Ambulatory Visit (INDEPENDENT_AMBULATORY_CARE_PROVIDER_SITE_OTHER): Payer: Medicare Other | Admitting: Family

## 2019-12-25 DIAGNOSIS — E669 Obesity, unspecified: Secondary | ICD-10-CM

## 2019-12-25 DIAGNOSIS — M1712 Unilateral primary osteoarthritis, left knee: Secondary | ICD-10-CM

## 2019-12-25 DIAGNOSIS — Z7189 Other specified counseling: Secondary | ICD-10-CM

## 2019-12-25 DIAGNOSIS — I4891 Unspecified atrial fibrillation: Secondary | ICD-10-CM

## 2019-12-25 DIAGNOSIS — E782 Mixed hyperlipidemia: Secondary | ICD-10-CM

## 2019-12-25 DIAGNOSIS — I10 Essential (primary) hypertension: Secondary | ICD-10-CM | POA: Diagnosis not present

## 2019-12-25 DIAGNOSIS — M170 Bilateral primary osteoarthritis of knee: Secondary | ICD-10-CM

## 2019-12-25 DIAGNOSIS — I509 Heart failure, unspecified: Secondary | ICD-10-CM | POA: Diagnosis not present

## 2019-12-25 DIAGNOSIS — M810 Age-related osteoporosis without current pathological fracture: Secondary | ICD-10-CM

## 2019-12-25 MED ORDER — CARVEDILOL 6.25 MG PO TABS: 6.2500 mg | ORAL_TABLET | Freq: Two times a day (BID) | ORAL | 3 refills | Status: DC

## 2019-12-25 MED ORDER — BENAZEPRIL HCL 40 MG PO TABS: 40.0000 mg | ORAL_TABLET | Freq: Every day | ORAL | 2 refills | Status: DC

## 2019-12-25 MED ORDER — AMLODIPINE BESYLATE 5 MG PO TABS: 5.0000 mg | ORAL_TABLET | Freq: Every day | ORAL | 2 refills | Status: DC

## 2019-12-25 MED ORDER — MELOXICAM 7.5 MG PO TABS: 7.5000 mg | ORAL_TABLET | Freq: Every day | ORAL | 4 refills | Status: DC

## 2019-12-25 MED ORDER — RALOXIFENE HCL 60 MG PO TABS: 60.0000 mg | ORAL_TABLET | Freq: Every day | ORAL | 2 refills | Status: DC

## 2019-12-25 MED ORDER — FUROSEMIDE 40 MG PO TABS: 40.0000 mg | ORAL_TABLET | Freq: Two times a day (BID) | ORAL | 3 refills | Status: DC

## 2019-12-25 NOTE — Progress Notes (Signed)
Virtual Visit via telephone Note Due to COVID-19 pandemic this visit was conducted virtually. This visit type was conducted due to national recommendations for restrictions regarding the COVID-19 Pandemic (e.g. social distancing, sheltering in place) in an effort to limit this patient's exposure and mitigate transmission in our community. All issues noted in this document were discussed and addressed.  A physical exam was not performed with this format.  I connected with Paige Ferguson on 12/25/19 at 12:30 pm by telephone and verified that I am speaking with the correct person using two identifiers. Paige Ferguson is currently located at home and grandson is currently with her during visit. The provider, Evelina Dun, FNP is located in their office at time of visit.  I discussed the limitations, risks, security and privacy concerns of performing an evaluation and management service by telephone and the availability of in person appointments. I also discussed with the patient that there may be a patient responsible charge related to this service. The patient expressed understanding and agreed to proceed.   History and Present Illness:  PT presents to the office today for chronic follow up. PT is currently on 3L of O2 continuous. She is followed by Cardiologists for A Fib and CHF. She reports her SOB has greatly improved since her fluid has improved.   She had an ECHO on 60-65%. She is currently taking Eliquis 5 mg daily.  Hypertension This is a chronic problem. The current episode started more than 1 year ago. The problem has been resolved since onset. The problem is controlled. Associated symptoms include malaise/fatigue, peripheral edema and shortness of breath (with distance). Risk factors for coronary artery disease include obesity and sedentary lifestyle. The current treatment provides moderate improvement. Hypertensive end-organ damage includes heart failure.  Congestive Heart  Failure Presents for follow-up visit. Associated symptoms include fatigue and shortness of breath (with distance). Pertinent negatives include no chest pressure, claudication or unexpected weight change. The symptoms have been stable.  Arthritis Presents for follow-up visit. She complains of pain and stiffness. The symptoms have been stable. Affected locations include the right knee and left knee. Her pain is at a severity of 7/10. Associated symptoms include fatigue.  Hyperlipidemia This is a chronic problem. The current episode started more than 1 year ago. The problem is controlled. Recent lipid tests were reviewed and are normal. Associated symptoms include shortness of breath (with distance). Current antihyperlipidemic treatment includes diet change. The current treatment provides moderate improvement of lipids. Risk factors for coronary artery disease include dyslipidemia, hypertension, post-menopausal and a sedentary lifestyle.      Review of Systems  Constitutional: Positive for fatigue and malaise/fatigue. Negative for unexpected weight change.  Respiratory: Positive for shortness of breath (with distance).   Cardiovascular: Negative for claudication.  Musculoskeletal: Positive for arthritis and stiffness.  All other systems reviewed and are negative.    Observations/Objective: No SOB or distress noted   Assessment and Plan: Paige Ferguson comes in today with chief complaint of No chief complaint on file.   Diagnosis and orders addressed:  1. Atrial fibrillation, unspecified type (Paige Ferguson)  2. Congestive heart failure, unspecified HF chronicity, unspecified heart failure type (Paige Ferguson)  3. Essential hypertension - benazepril (LOTENSIN) 40 MG tablet; Take 1 tablet (40 mg total) by mouth daily.  Dispense: 90 tablet; Refill: 2 - amLODipine (NORVASC) 5 MG tablet; Take 1 tablet (5 mg total) by mouth daily.  Dispense: 90 tablet; Refill: 2 - carvedilol (COREG) 6.25 MG tablet; Take 1  tablet (6.25 mg total) by mouth 2 (two) times daily with a meal.  Dispense: 180 tablet; Refill: 3  4. Primary osteoarthritis of both knees - meloxicam (MOBIC) 7.5 MG tablet; Take 1 tablet (7.5 mg total) by mouth daily.  Dispense: 90 tablet; Refill: 4  5. Mixed hyperlipidemia  6. Obesity (BMI 30-39.9)  7. Osteoporosis, unspecified osteoporosis type, unspecified pathological fracture presence - raloxifene (EVISTA) 60 MG tablet; Take 1 tablet (60 mg total) by mouth daily.  Dispense: 90 tablet; Refill: 2  8. Arthritis of knee, left - meloxicam (MOBIC) 7.5 MG tablet; Take 1 tablet (7.5 mg total) by mouth daily.  Dispense: 90 tablet; Refill: 4  9. Educated about COVID-19 virus infection Pt is on waiting list for vaccine  Labs pending Health Maintenance reviewed Diet and exercise encouraged  Follow up : 6 months     I discussed the assessment and treatment plan with the patient. The patient was provided an opportunity to ask questions and all were answered. The patient agreed with the plan and demonstrated an understanding of the instructions.   The patient was advised to call back or seek an in-person evaluation if the symptoms worsen or if the condition fails to improve as anticipated.  The above assessment and management plan was discussed with the patient. The patient verbalized understanding of and has agreed to the management plan. Patient is aware to call the clinic if symptoms persist or worsen. Patient is aware when to return to the clinic for a follow-up visit. Patient educated on when it is appropriate to go to the emergency department.   Time call ended:  12:54 pm   I provided 24 minutes of non-face-to-face time during this encounter.    Evelina Dun, FNP

## 2020-01-04 DIAGNOSIS — I509 Heart failure, unspecified: Secondary | ICD-10-CM | POA: Diagnosis not present

## 2020-01-04 DIAGNOSIS — R0602 Shortness of breath: Secondary | ICD-10-CM | POA: Diagnosis not present

## 2020-01-13 DIAGNOSIS — R609 Edema, unspecified: Secondary | ICD-10-CM | POA: Diagnosis not present

## 2020-01-13 DIAGNOSIS — I509 Heart failure, unspecified: Secondary | ICD-10-CM | POA: Diagnosis not present

## 2020-01-13 DIAGNOSIS — R0602 Shortness of breath: Secondary | ICD-10-CM | POA: Diagnosis not present

## 2020-02-01 DIAGNOSIS — R0602 Shortness of breath: Secondary | ICD-10-CM | POA: Diagnosis not present

## 2020-02-01 DIAGNOSIS — I509 Heart failure, unspecified: Secondary | ICD-10-CM | POA: Diagnosis not present

## 2020-02-10 DIAGNOSIS — R0602 Shortness of breath: Secondary | ICD-10-CM | POA: Diagnosis not present

## 2020-02-10 DIAGNOSIS — R609 Edema, unspecified: Secondary | ICD-10-CM | POA: Diagnosis not present

## 2020-02-10 DIAGNOSIS — I509 Heart failure, unspecified: Secondary | ICD-10-CM | POA: Diagnosis not present

## 2020-03-02 ENCOUNTER — Telehealth: Payer: Self-pay | Admitting: Family

## 2020-03-02 NOTE — Chronic Care Management (AMB) (Signed)
  Chronic Care Management   Note  03/02/2020 Name: Paige Ferguson MRN: [id number] DOB: February 10, 1921  Paige Ferguson is a 86 y.o. year old female who is a primary care patient of Sharion Balloon, FNP. I reached out to Paige Ferguson by phone today in response to a referral sent by Ms. Darylene Price [employer]'s health plan.     Ms. Tout' daughter Paige Ferguson was given information about Chronic Care Management services today including:  1. CCM service includes personalized support from designated clinical staff supervised by her physician, including individualized plan of care and coordination with other care providers 2. 24/7 contact phone numbers for assistance for urgent and routine care needs. 3. Service will only be billed when office clinical staff spend 20 minutes or more in a month to coordinate care. 4. Only one practitioner may furnish and bill the service in a calendar month. 5. The patient may stop CCM services at any time (effective at the end of the month) by phone call to the office staff. 6. The patient will be responsible for cost sharing (co-pay) of up to 20% of the service fee (after annual deductible is met).  Patient's daughter Paige Ferguson agreed to services and verbal consent obtained.   Follow up plan: Telephone appointment with care management team member scheduled for:03/12/2020  Noreene Larsson, [address], [place], [place] [address] Direct Dial: [phone] Paige [email or web address] Website: [email or web address]

## 2020-03-03 DIAGNOSIS — R0602 Shortness of breath: Secondary | ICD-10-CM | POA: Diagnosis not present

## 2020-03-03 DIAGNOSIS — I509 Heart failure, unspecified: Secondary | ICD-10-CM | POA: Diagnosis not present

## 2020-03-12 ENCOUNTER — Ambulatory Visit (INDEPENDENT_AMBULATORY_CARE_PROVIDER_SITE_OTHER): Payer: Medicare Other | Admitting: Licensed Clinical Social Worker

## 2020-03-12 DIAGNOSIS — I10 Essential (primary) hypertension: Secondary | ICD-10-CM | POA: Diagnosis not present

## 2020-03-12 DIAGNOSIS — I509 Heart failure, unspecified: Secondary | ICD-10-CM

## 2020-03-12 DIAGNOSIS — R0602 Shortness of breath: Secondary | ICD-10-CM | POA: Diagnosis not present

## 2020-03-12 DIAGNOSIS — M170 Bilateral primary osteoarthritis of knee: Secondary | ICD-10-CM | POA: Diagnosis not present

## 2020-03-12 DIAGNOSIS — E782 Mixed hyperlipidemia: Secondary | ICD-10-CM

## 2020-03-12 DIAGNOSIS — R609 Edema, unspecified: Secondary | ICD-10-CM | POA: Diagnosis not present

## 2020-03-12 DIAGNOSIS — G459 Transient cerebral ischemic attack, unspecified: Secondary | ICD-10-CM | POA: Diagnosis not present

## 2020-03-12 DIAGNOSIS — I4891 Unspecified atrial fibrillation: Secondary | ICD-10-CM

## 2020-03-12 NOTE — Patient Instructions (Addendum)
Licensed Clinical Social Worker Visit Information  Goals we discussed today:  Goals Addressed            This Visit's Progress   . Client will talk with LCSW in next 30 days to discuss client completion of ADLs and daily activities (pt-stated)       CARE PLAN ENTRY   Current Barriers:  Marland Kitchen Mobility issues (uses a walker to help her walk) . Risk for falls in client with Chronic Diagnoses of HTN, HLD, OA, TIA, CHF, Atrial Fibrillation . Needs assistance for meal preparation  Clinical Social Work Clinical Goal(s):  Marland Kitchen LCSW will talk with client in next 30 days to talk with her about client completion of ADLs and client completion of daily activities  Interventions: . Talked with client about CCM program services . Encouraged client to talk with RNCM as needed to discuss nursing needs of client  . Talked with client about pain issues of client . Talked with client about social support network (support from her daughter and from her grandson) . Talked with client about meal provision for client . Talked with client about client completion of ADLs . Talked with client about her decreased energy when standing . Talked with client about her use of oxygen to help her breathe . Talked with client about her use of [employer] patient catalogue quarterly  . Talked with client about vision challenges of client  Patient Self Care Activities:   Eats with set up assistance Takes medications as prescribed  Patient Self Care Deficits: Marland Kitchen Mobility issues . Need for oxygen to help her breathe  Initial goal documentation       Materials Provided: No  Follow Up Plan: LCSW will call client in next 4 weeks to talk with client about client completion of ADLs and daily activities  The patient verbalized understanding of instructions provided today and declined a print copy of patient instruction materials.   [name].Harout Scheurich MSW, LCSW Licensed Clinical Social Worker [facility] Care Management [phone]

## 2020-03-12 NOTE — Chronic Care Management (AMB) (Addendum)
Chronic Care Management    Clinical Social Work Follow Up Note  03/12/2020 Name: Paige Ferguson MRN: [id number] DOB: 07/21/1921  Paige Ferguson is a 86 y.o. year old female who is a primary care patient of Sharion Balloon, FNP. The CCM team was consulted for assistance with [name] .   Review of patient status, including review of consultants reports, other relevant assessments, and collaboration with appropriate care team members and the patient's provider was performed as part of comprehensive patient evaluation and provision of chronic care management services.    SDOH (Social Determinants of Health) assessments performed: Yes;risk for tobacco use; risk for depression   Outpatient Encounter Medications as of 03/12/2020  Medication Sig   amLODipine (NORVASC) 5 MG tablet Take 1 tablet (5 mg total) by mouth daily.   apixaban (ELIQUIS) 5 MG TABS tablet Take 1 tablet (5 mg total) by mouth 2 (two) times daily.   benazepril (LOTENSIN) 40 MG tablet Take 1 tablet (40 mg total) by mouth daily.   Calcium-Magnesium-Vitamin D (CALCIUM 1200+D3 PO) Take by mouth.   carvedilol (COREG) 6.25 MG tablet Take 1 tablet (6.25 mg total) by mouth 2 (two) times daily with a meal.   clotrimazole (CLOTRIMAZOLE ANTI-FUNGAL) 1 % cream Apply 1 application topically 2 (two) times daily.   furosemide (LASIX) 40 MG tablet Take 1 tablet (40 mg total) by mouth 2 (two) times daily.   meloxicam (MOBIC) 7.5 MG tablet Take 1 tablet (7.5 mg total) by mouth daily.   Misc Natural Products (GNP GLUCOSAME CHONDROITIN DS) TABS Take by mouth.   Multiple Vitamins-Minerals (CENTRUM SILVER ADULT 50+) TABS Take 1 tablet by mouth daily.   Omega-3 Fatty Acids (FISH OIL) 1000 MG CAPS Take 1 capsule by mouth 2 (two) times daily.   raloxifene (EVISTA) 60 MG tablet Take 1 tablet (60 mg total) by mouth daily.   vitamin E 200 UNIT capsule Take 200 Units by mouth daily.   No facility-administered encounter medications on file as of  03/12/2020.    Goals Addressed             This Visit's Progress    Client will talk with LCSW in next 30 days to discuss client completion of ADLs and daily activities (pt-stated)       ARE PLAN ENTRY   Current Barriers:  Mobility issues (uses a walker to help her walk) Risk for falls in client with Chronic Diagnoses of HTN, HLD, OA, TIA, CHF, Atrial Fibrillation Needs assistance for meal preparation  Clinical Social Work Clinical Goal(s):  LCSW will talk with client in next 30 days to talk with her about client completion of ADLs and client completion of daily activities  Interventions: Talked with client about CCM program services Encouraged client to talk with RNCM as needed to discuss nursing needs of client  Talked with client about pain issues of client Talked with client about social support network (support from her daughter and from her grandson) Talked with client about meal provision for client Talked with client about client completion of ADLs Talked with client about her decreased energy when standing Talked with client about her use of oxygen to help her breathe Talked with client about her use of [employer] patient catalogue quarterly  Talked with client about vision challenges of client  Patient Self Care Activities:   Eats with set up assistance Takes medications as prescribed  Patient Self Care Deficits: Mobility issues Need for oxygen to help her breathe  Initial goal  documentation       Follow Up Plan: LCSW will call client in next 4 weeks to talk with client about client completion of ADLs and daily activities  [name].Brienna Bass MSW, LCSW Licensed Clinical Social Worker [facility] Care [facility] [phone]  I have reviewed and agree with the above  documentation.   Evelina Dun, FNP

## 2020-03-22 ENCOUNTER — Telehealth: Payer: Self-pay | Admitting: Cardiovascular Disease

## 2020-03-22 NOTE — Telephone Encounter (Signed)
Patient's grandson states he is requesting for the patient's daughter, Sheran Spine, to accompany the patient due to her being in a transport chair. He states Raquel Sarna has received both dosages of  COVID-19 vaccination. Please advise.

## 2020-03-23 NOTE — Telephone Encounter (Signed)
Left VM that daughter ok to accompany patient.

## 2020-03-25 ENCOUNTER — Encounter: Payer: Self-pay | Admitting: Cardiovascular Disease

## 2020-03-25 ENCOUNTER — Other Ambulatory Visit: Payer: Self-pay

## 2020-03-25 ENCOUNTER — Ambulatory Visit (INDEPENDENT_AMBULATORY_CARE_PROVIDER_SITE_OTHER): Payer: Medicare Other | Admitting: Cardiovascular Disease

## 2020-03-25 VITALS — BP 134/60 | HR 87 | Ht <= 58 in | Wt 139.0 lb

## 2020-03-25 DIAGNOSIS — I4819 Other persistent atrial fibrillation: Secondary | ICD-10-CM

## 2020-03-25 DIAGNOSIS — I5032 Chronic diastolic (congestive) heart failure: Secondary | ICD-10-CM

## 2020-03-25 NOTE — Patient Instructions (Addendum)
Medication Instructions:  *If you need a refill on your cardiac medications before your next appointment, please call your pharmacy*  Lab Work: If you have labs (blood work) drawn today and your tests are completely normal, you will receive your results only by: Marland Kitchen MyChart Message (if you have MyChart) OR . A paper copy in the mail If you have any lab test that is abnormal or we need to change your treatment, we will call you to review the results.  Follow-Up: At [facility], you and your health needs are our priority.  As part of our continuing mission to provide you with exceptional heart care, we have created designated Provider Care Teams.  These Care Teams include your primary Cardiologist (physician) and Advanced Practice Providers (APPs -  Physician Assistants and Nurse Practitioners) who all work together to provide you with the care you need, when you need it.  We recommend signing up for the patient portal called "MyChart".  Sign up information is provided on this After Visit Summary.  MyChart is used to connect with patients for Virtual Visits (Telemedicine).  Patients are able to view lab/test results, encounter notes, upcoming appointments, etc.  Non-urgent messages can be sent to your provider as well.   To learn more about what you can do with MyChart, go to [email or web address].    Your next appointment:   6 month(s)  The format for your next appointment:   In Person  Provider:   You may see Lauree Chandler, MD or one of the following Advanced Practice Providers on your designated Care Team:    Melina Copa, PA-C  Ermalinda Barrios, PA-C

## 2020-03-25 NOTE — Progress Notes (Signed)
Chief Complaint  Patient presents with  . Follow-up    HTN   History of Present Illness: 86 yo female with history of persistent atrial fibrillation, chronic kidney disease, hyperlipidemia, HTN and TIA who is here today for cardiac follow up. I saw her as a new patient in July 2020. She had the onset of lower extremity edema in 2020 and was started on Lasix. No known cardiac issues. Her EKG at her first visit with me showed atrial fibrillation. She had no awareness of this and no palpitations at home. She was started on Eliquis. Her Coreg was continued. Echo July 2020 with normal LV systolic function, [id number]. No significant valve disease.   She is here today for follow up. The patient denies any chest pain, dyspnea, palpitations, lower extremity edema, orthopnea, PND, dizziness, near syncope or syncope. She has been feeling weak since she started Eliquis in July 2020. She thinks it is causing her problems.   Primary Care Physician: Sharion Balloon, FNP  Past Medical History:  Diagnosis Date  . Allergy   . Arthritis of knee   . Cataract   . Cholelithiases   . Chronic kidney disease   . Endometrial polyp 7/98   2 degree polyps   . Genu valgum   . Gout   . Hyperlipidemia   . Hypertension   . Left knee pain   . Osteoarthritis   . Osteoporosis   . Postmenopausal bleeding   . Recurrent UTI   . Renal abscess, right   . Shoulder fracture, left   . TIA (transient ischemic attack)   . Ureteral calculi   . URI (upper respiratory infection)     Past Surgical History:  Procedure Laterality Date  . EYE SURGERY    . HERNIA REPAIR    . HYSTEROSCOPY , FRACTIONAL D&C  7/98/  . [place]  . LEFT CATARACT SURGERY  10/99  . RIGHT CATARACT SURGERY  7/99   Dr. Dolores Lory   . RIGHT HEMINEPHRECTOMY FOR ABSCESS  2/98  . RIGHT NEPHRELITHOTOMY  1982    Current Outpatient Medications  Medication Sig Dispense Refill  . amLODipine (NORVASC) 5 MG tablet Take 1 tablet (5 mg  total) by mouth daily. 90 tablet 2  . apixaban (ELIQUIS) 5 MG TABS tablet Take 1 tablet (5 mg total) by mouth 2 (two) times daily. 60 tablet 5  . benazepril (LOTENSIN) 40 MG tablet Take 1 tablet (40 mg total) by mouth daily. 90 tablet 2  . Calcium-Magnesium-Vitamin D (CALCIUM 1200+D3 PO) Take by mouth.    . carvedilol (COREG) 6.25 MG tablet Take 1 tablet (6.25 mg total) by mouth 2 (two) times daily with a meal. 180 tablet 3  . clotrimazole (CLOTRIMAZOLE ANTI-FUNGAL) 1 % cream Apply 1 application topically 2 (two) times daily. 30 g 0  . furosemide (LASIX) 40 MG tablet Take 1 tablet (40 mg total) by mouth 2 (two) times daily. 60 tablet 3  . meloxicam (MOBIC) 7.5 MG tablet Take 1 tablet (7.5 mg total) by mouth daily. 90 tablet 4  . Misc Natural Products (GNP GLUCOSAME CHONDROITIN DS) TABS Take by mouth.    . Multiple Vitamins-Minerals (CENTRUM SILVER ADULT 50+) TABS Take 1 tablet by mouth daily.    . Omega-3 Fatty Acids (FISH OIL) 1000 MG CAPS Take 1 capsule by mouth 2 (two) times daily.    . raloxifene (EVISTA) 60 MG tablet Take 1 tablet (60 mg total) by mouth daily. 90 tablet 2  .  vitamin E 200 UNIT capsule Take 200 Units by mouth daily.     No current facility-administered medications for this visit.    Allergies  Allergen Reactions  . Sulfa Antibiotics Nausea And Vomiting  . Amoxicillin   . Nitrofurantoin Monohyd Macro     Social History   Socioeconomic History  . Marital status: Widowed    Spouse name: Not on file  . Number of children: Not on file  . Years of education: Not on file  . Highest education level: Not on file  Occupational History  . Not on file  Tobacco Use  . Smoking status: Former Smoker    Types: Cigarettes    Quit date: 11/20/1944    Years since quitting: 75.3  . Smokeless tobacco: Never Used  Substance and Sexual Activity  . Alcohol use: No  . Drug use: No  . Sexual activity: Never  Other Topics Concern  . Not on file  Social History Narrative    WIDOWED   3 CHILDREN 1 DECEASED   Social Determinants of Health   Financial Resource Strain:   . Difficulty of Paying Living Expenses:   Food Insecurity:   . Worried About Charity fundraiser in the Last Year:   . Arboriculturist in the Last Year:   Transportation Needs:   . Film/video editor (Medical):   Marland Kitchen Lack of Transportation (Non-Medical):   Physical Activity:   . Days of Exercise per Week:   . Minutes of Exercise per Session:   Stress:   . Feeling of Stress :   Social Connections:   . Frequency of Communication with Friends and Family:   . Frequency of Social Gatherings with Friends and Family:   . Attends Religious Services:   . Active Member of Clubs or Organizations:   . Attends Archivist Meetings:   Marland Kitchen Marital Status:   Intimate Partner Violence:   . Fear of Current or Ex-Partner:   . Emotionally Abused:   Marland Kitchen Physically Abused:   . Sexually Abused:     Family History  Problem Relation Age of Onset  . Stroke Maternal Grandmother   . Hypertension Maternal Grandmother   . Diabetes Maternal Grandmother   . Pneumonia Father   . Coronary artery disease Paternal Grandfather   . Heart defect Son     Review of Systems:  As stated in the HPI and otherwise negative.   BP 134/60   Pulse 87   Ht 4\' 9"  (1.448 m)   Wt 139 lb (63 kg)   SpO2 99%   BMI 30.08 kg/m   Physical Examination: General: Well developed, well nourished, NAD  HEENT: OP clear, mucus membranes moist  SKIN: warm, dry. No rashes. Neuro: No focal deficits  Musculoskeletal: Muscle strength 5/5 all ext  Psychiatric: Mood and affect normal  Neck: No JVD, no carotid bruits, no thyromegaly, no lymphadenopathy.  Lungs:Clear bilaterally, no wheezes, rhonci, crackles Cardiovascular: Irreg irreg. No murmurs, gallops or rubs. Abdomen:Soft. Bowel sounds present. Non-tender.  Extremities: No lower extremity edema. Pulses are 2 + in the bilateral DP/PT.  EKG:  EKG is not ordered today. The  ekg ordered today demonstrates   Echo 06/04/19:  1. The left ventricle has normal systolic function with an ejection fraction of 60-65%. The cavity size was normal. Left ventricular diastolic Doppler parameters are consistent with impaired relaxation.  2. The right ventricle has normal systolic function. The cavity was normal. There is no increase in right ventricular  wall thickness.  3. Left atrial size was moderately dilated.  4. Mild thickening of the mitral valve leaflet. Mild calcification of the mitral valve leaflet.  5. The inferior vena cava was dilated in size with >50% respiratory variability.  Recent Labs: 05/08/2019: BNP 165.7 06/26/2019: ALT 13 08/28/2019: BUN 25; Creatinine, Ser 0.94; Hemoglobin 11.3; Platelets 247; Potassium 4.2; Sodium 148   Lipid Panel    Component Value Date/Time   CHOL 137 01/01/2019 1500   TRIG 58 01/01/2019 1500   HDL 44 01/01/2019 1500   CHOLHDL 3.1 01/01/2019 1500   CHOLHDL 3.4 06/05/2013 1007   VLDL 18 06/05/2013 1007   LDLCALC 81 01/01/2019 1500     Wt Readings from Last 3 Encounters:  03/25/20 139 lb (63 kg)  09/19/19 143 lb (64.9 kg)  08/28/19 135 lb (61.2 kg)    Assessment and Plan:   1. Chronic Congestive heart failure: Echo July 2020 with normal LV systolic function. Weight is stable.No evidence of volume overload on exam. Will continue Lasix.    2. Atrial fibrillation, persistent: Her heart rate is controlled today. She is in atrial fibrillation. She is on Elqiuis and Coreg.  (Patient is >87 years old, but weight is >60kg and scr is <1.5, Weight 65kg, Scr 0.98 on 05/12/2019, Patient qualifies for Eliquis 5mg  BID) She thinks she began to feel weak after starting Elqiuis. She will hold the Eliquis for two weeks and see if she feels better. She understands the risk of stroke off of anticoagulation.   Current medicines are reviewed at length with the patient today.  The patient does not have concerns regarding medicines.  The following  changes have been made:  no change  Labs/ tests ordered today include:   No orders of the defined types were placed in this encounter.    Disposition:   FU with me in 6 months   Signed, Lauree Chandler, MD 03/25/2020 4:56 PM    [facility] [employer] [employer], [place], [place]  [address] Phone: [phone]; Fax: [phone]

## 2020-04-02 DIAGNOSIS — I509 Heart failure, unspecified: Secondary | ICD-10-CM | POA: Diagnosis not present

## 2020-04-02 DIAGNOSIS — R0602 Shortness of breath: Secondary | ICD-10-CM | POA: Diagnosis not present

## 2020-04-11 DIAGNOSIS — R0602 Shortness of breath: Secondary | ICD-10-CM | POA: Diagnosis not present

## 2020-04-11 DIAGNOSIS — I509 Heart failure, unspecified: Secondary | ICD-10-CM | POA: Diagnosis not present

## 2020-04-11 DIAGNOSIS — R609 Edema, unspecified: Secondary | ICD-10-CM | POA: Diagnosis not present

## 2020-04-14 ENCOUNTER — Ambulatory Visit (INDEPENDENT_AMBULATORY_CARE_PROVIDER_SITE_OTHER): Payer: Medicare Other | Admitting: Licensed Clinical Social Worker

## 2020-04-14 DIAGNOSIS — I10 Essential (primary) hypertension: Secondary | ICD-10-CM | POA: Diagnosis not present

## 2020-04-14 DIAGNOSIS — G459 Transient cerebral ischemic attack, unspecified: Secondary | ICD-10-CM | POA: Diagnosis not present

## 2020-04-14 DIAGNOSIS — E782 Mixed hyperlipidemia: Secondary | ICD-10-CM | POA: Diagnosis not present

## 2020-04-14 DIAGNOSIS — M170 Bilateral primary osteoarthritis of knee: Secondary | ICD-10-CM

## 2020-04-14 DIAGNOSIS — I509 Heart failure, unspecified: Secondary | ICD-10-CM | POA: Diagnosis not present

## 2020-04-14 DIAGNOSIS — I4891 Unspecified atrial fibrillation: Secondary | ICD-10-CM

## 2020-04-14 NOTE — Chronic Care Management (AMB) (Addendum)
Chronic Care Management    Clinical Social Work Follow Up Note  04/14/2020 Name: Paige Ferguson MRN: [id number] DOB: June 26, 1921  Paige Ferguson is a 86 y.o. year old female who is a primary care patient of Sharion Balloon, FNP. The CCM team was consulted for assistance with [name] .   Review of patient status, including review of consultants reports, other relevant assessments, and collaboration with appropriate care team members and the patient's provider was performed as part of comprehensive patient evaluation and provision of chronic care management services.    SDOH (Social Determinants of Health) assessments performed: Yes;risk for tobacco use; risk for depression    Chronic Care Management from 04/14/2020 in [place]  PHQ-9 Total Score  4      GAD 7 : Generalized Anxiety Score 04/14/2020  Nervous, Anxious, on Edge 0  Control/stop worrying 0  Worry too much - different things 0  Trouble relaxing 0  Restless 0  Easily annoyed or irritable 0  Afraid - awful might happen 0  Total GAD 7 Score 0  Anxiety Difficulty Somewhat difficult     Outpatient Encounter Medications as of 04/14/2020  Medication Sig   amLODipine (NORVASC) 5 MG tablet Take 1 tablet (5 mg total) by mouth daily.   apixaban (ELIQUIS) 5 MG TABS tablet Take 1 tablet (5 mg total) by mouth 2 (two) times daily.   benazepril (LOTENSIN) 40 MG tablet Take 1 tablet (40 mg total) by mouth daily.   Calcium-Magnesium-Vitamin D (CALCIUM 1200+D3 PO) Take by mouth.   carvedilol (COREG) 6.25 MG tablet Take 1 tablet (6.25 mg total) by mouth 2 (two) times daily with a meal.   clotrimazole (CLOTRIMAZOLE ANTI-FUNGAL) 1 % cream Apply 1 application topically 2 (two) times daily.   furosemide (LASIX) 40 MG tablet Take 1 tablet (40 mg total) by mouth 2 (two) times daily.   meloxicam (MOBIC) 7.5 MG tablet Take 1 tablet (7.5 mg total) by mouth daily.   Misc Natural Products (GNP GLUCOSAME CHONDROITIN  DS) TABS Take by mouth.   Multiple Vitamins-Minerals (CENTRUM SILVER ADULT 50+) TABS Take 1 tablet by mouth daily.   Omega-3 Fatty Acids (FISH OIL) 1000 MG CAPS Take 1 capsule by mouth 2 (two) times daily.   raloxifene (EVISTA) 60 MG tablet Take 1 tablet (60 mg total) by mouth daily.   vitamin E 200 UNIT capsule Take 200 Units by mouth daily.   No facility-administered encounter medications on file as of 04/14/2020.    Goals      Client will talk with LCSW in next 30 days to discuss client completion of ADLs and daily activities (pt-stated)     CARE PLAN ENTRY   Current Barriers:  Mobility issues (uses a walker to help her walk) Risk for falls in client with Chronic Diagnoses of HTN, HLD, OA, TIA, CHF, Atrial Fibrillation Needs assistance for meal preparation  Clinical Social Work Clinical Goal(s):  LCSW will talk with client in next 30 days to talk with her about client completion of ADLs and client completion of daily activities  Interventions: Talked with Paige Ferguson, grandson, about CCM program services Encouraged client or Paige Ferguson to talk with RNCM as needed to discuss nursing needs of client  Talked with Paige Ferguson about pain issues of client Talked with Paige Ferguson about social support network (support from her daughter and from her grandson) Talked with Paige Ferguson about meal provision for client Talked with Paige Ferguson about client completion of ADLs Talked with Paige Ferguson about  client's decreased energy upon exertion Talked with Paige Ferguson about client use of oxygen to help her breathe Talked with Paige Ferguson about vision challenges of client Talked with Paige Ferguson, grandson, about DME of client (she has oxygen concentrator,  long tubing, she has a transport chair, she has [employer] portable oxygen system,has tub seat, has a 3 in 1 bedside commode) Talked with Paige Ferguson about client's upcoming medical appointments  Patient Self Care Activities:   Eats with set up assistance Takes medications as  prescribed  Patient Self Care Deficits: Mobility issues Need for oxygen to help her breathe  Initial goal documentation        Follow Up Plan: LCSW will call [name], grandson in next 4 weeks to talk with client/Paige about client completion of ADLs and daily activities  [name].Forrest MSW, LCSW Licensed Clinical Social Worker [facility] [phone]  I have reviewed and agree with the above  documentation.   Evelina Dun, FNP

## 2020-04-14 NOTE — Patient Instructions (Addendum)
Licensed Clinical Social Worker Visit Information  Goals we discussed today:  Goals    . Client will talk with LCSW in next 30 days to discuss client completion of ADLs and daily activities (pt-stated)     CARE PLAN ENTRY   Current Barriers:  Marland Kitchen Mobility issues (uses a walker to help her walk) . Risk for falls in client with Chronic Diagnoses of HTN, HLD, OA, TIA, CHF, Atrial Fibrillation . Needs assistance for meal preparation  Clinical Social Work Clinical Goal(s):  Marland Kitchen LCSW will talk with client in next 30 days to talk with her about client completion of ADLs and client completion of daily activities  Interventions: Talked with [name], grandson, about CCM program services  Encouraged client or [name] to talk with RNCM as needed to discuss nursing needs of client  Talked with [name] about pain issues of client  Talked with [name] about social support network for client (support from her daughter and from her grandson)  Talked with [name] about meal provision for client  Talked with [name] about client completion of ADLs  Talked with [name] about client's decreased energy upon exertion  Talked with [name] about client use of oxygen to help her breathe  Talked with [name] about vision challenges of client  Talked with [name], grandson, about DME of client (she has oxygen concentrator, long tubing, she has a transport chair, she has [employer] portable oxygen system,has tub seat, has a 3 in 1 bedside commode)  Talked with [name] about client's upcoming medical appointments  Patient Self Care Activities:   Eats with set up assistance Takes medications as prescribed  Patient Self Care Deficits: Marland Kitchen Mobility issues . Need for oxygen to help her breathe  Initial goal documentation    Materials Provided: No  Follow Up Plan: LCSW will call client/[name] Dalton, grandson in next 4 weeks to talk with client/[name] about client completion of ADLs and daily activities  The  patient/[name] Kirk Ruths, grandson, verbalized understanding of instructions provided today and declined a print copy of patient instruction materials.   [name].Ryleeann Urquiza MSW, LCSW Licensed Clinical Social Worker [facility] [phone]

## 2020-05-03 DIAGNOSIS — I509 Heart failure, unspecified: Secondary | ICD-10-CM | POA: Diagnosis not present

## 2020-05-03 DIAGNOSIS — R0602 Shortness of breath: Secondary | ICD-10-CM | POA: Diagnosis not present

## 2020-05-12 DIAGNOSIS — R0602 Shortness of breath: Secondary | ICD-10-CM | POA: Diagnosis not present

## 2020-05-12 DIAGNOSIS — I509 Heart failure, unspecified: Secondary | ICD-10-CM | POA: Diagnosis not present

## 2020-05-12 DIAGNOSIS — R609 Edema, unspecified: Secondary | ICD-10-CM | POA: Diagnosis not present

## 2020-05-19 ENCOUNTER — Ambulatory Visit (INDEPENDENT_AMBULATORY_CARE_PROVIDER_SITE_OTHER): Payer: Medicare Other | Admitting: Licensed Clinical Social Worker

## 2020-05-19 DIAGNOSIS — I10 Essential (primary) hypertension: Secondary | ICD-10-CM | POA: Diagnosis not present

## 2020-05-19 DIAGNOSIS — I4891 Unspecified atrial fibrillation: Secondary | ICD-10-CM

## 2020-05-19 DIAGNOSIS — G459 Transient cerebral ischemic attack, unspecified: Secondary | ICD-10-CM | POA: Diagnosis not present

## 2020-05-19 DIAGNOSIS — I509 Heart failure, unspecified: Secondary | ICD-10-CM | POA: Diagnosis not present

## 2020-05-19 DIAGNOSIS — M170 Bilateral primary osteoarthritis of knee: Secondary | ICD-10-CM

## 2020-05-19 DIAGNOSIS — E782 Mixed hyperlipidemia: Secondary | ICD-10-CM

## 2020-05-19 NOTE — Patient Instructions (Addendum)
Licensed Clinical Social Worker Visit Information  Goals we discussed today:    .  Client will talk with LCSW in next 30 days to discuss client completion of ADLs and daily activities (pt-stated)        CARE PLAN ENTRY   Current Barriers:   Mobility issues (uses a walker to help her walk)  Risk for falls in client with Chronic Diagnoses of HTN, HLD, OA, TIA, CHF, Atrial Fibrillation  Needs assistance for meal preparation  Clinical Social Work Clinical Goal(s):   LCSW will talk with client in next 30 days to talk with her about client completion of ADLs and client completion of daily activities  Interventions:  Talked withJoseph Kirk Ferguson, [employer] CCM program services  Encouraged clientor Josephto talk with RNCM as needed to discuss nursing needs of client   Talked withJosephabout pain issues of client  Talked withJosephabout social support network (support from her daughter and from her grandson)  Talked withJosephabout meal provision for client  Talked withJosephabout client completion of ADLs  Talked withJosephaboutclient'sdecreased energyupon exertion  Talked withJosephaboutclientuse of oxygen to help her breathe  Talked withJosephabout vision challenges of client  Talked with Paige Ferguson, about DME of client (she has oxygen concentrator,longtubing, she has a transport chair, she has [employer] portable oxygen system,has tub seat, has a 3 in 1 bedside commode)  Talked with Paige Ferguson about client's upcoming medical appointments  Patient Self Care Activities:   Eats with set up assistance Takes medications as prescribed  Patient Self Care Deficits:  Mobility issues  Need for oxygen to help her breathe  Initial goal documentation    Follow Up Plan: LCSW will call [name], grandsonin next 4 weeks to talk with client/Josephabout client completion of ADLs and daily activities  Materials Provided:  No  The patient/Paige Ferguson, grandson, verbalized understanding of instructions provided today and declined a print copy of patient instruction materials.   Paige Ferguson.Paige Ferguson MSW, LCSW Licensed Clinical Social Worker [facility] [phone]

## 2020-05-19 NOTE — Chronic Care Management (AMB) (Addendum)
Chronic Care Management    Clinical Social Work Follow Up Note  05/19/2020 Name: Paige Ferguson MRN: [id number] DOB: 18-Oct-1921  Paige Ferguson is a 86 y.o. year old female who is a primary care patient of Sharion Balloon, FNP. The CCM team was consulted for assistance with [name] .   Review of patient status, including review of consultants reports, other relevant assessments, and collaboration with appropriate care team members and the patient's provider was performed as part of comprehensive patient evaluation and provision of chronic care management services.    SDOH (Social Determinants of Health) assessments performed: No ; risk for tobacco use; risk for depression; risk for social isolation; risk for stress    Chronic Care Management from 04/14/2020 in [facility]  PHQ-9 Total Score 4       GAD 7 : Generalized Anxiety Score 04/14/2020  Nervous, Anxious, on Edge 0  Control/stop worrying 0  Worry too much - different things 0  Trouble relaxing 0  Restless 0  Easily annoyed or irritable 0  Afraid - awful might happen 0  Total GAD 7 Score 0  Anxiety Difficulty Somewhat difficult     Outpatient Encounter Medications as of 05/19/2020  Medication Sig   amLODipine (NORVASC) 5 MG tablet Take 1 tablet (5 mg total) by mouth daily.   apixaban (ELIQUIS) 5 MG TABS tablet Take 1 tablet (5 mg total) by mouth 2 (two) times daily.   benazepril (LOTENSIN) 40 MG tablet Take 1 tablet (40 mg total) by mouth daily.   Calcium-Magnesium-Vitamin D (CALCIUM 1200+D3 PO) Take by mouth.   carvedilol (COREG) 6.25 MG tablet Take 1 tablet (6.25 mg total) by mouth 2 (two) times daily with a meal.   clotrimazole (CLOTRIMAZOLE ANTI-FUNGAL) 1 % cream Apply 1 application topically 2 (two) times daily.   furosemide (LASIX) 40 MG tablet Take 1 tablet (40 mg total) by mouth 2 (two) times daily.   meloxicam (MOBIC) 7.5 MG tablet Take 1 tablet (7.5 mg total) by mouth daily.   Misc  Natural Products (GNP GLUCOSAME CHONDROITIN DS) TABS Take by mouth.   Multiple Vitamins-Minerals (CENTRUM SILVER ADULT 50+) TABS Take 1 tablet by mouth daily.   Omega-3 Fatty Acids (FISH OIL) 1000 MG CAPS Take 1 capsule by mouth 2 (two) times daily.   raloxifene (EVISTA) 60 MG tablet Take 1 tablet (60 mg total) by mouth daily.   vitamin E 200 UNIT capsule Take 200 Units by mouth daily.   No facility-administered encounter medications on file as of 05/19/2020.    Goals       Client will talk with LCSW in next 30 days to discuss client completion of ADLs and daily activities (pt-stated)      CARE PLAN ENTRY   Current Barriers:  Mobility issues (uses a walker to help her walk) Risk for falls in client with Chronic Diagnoses of HTN, HLD, OA, TIA, CHF, Atrial Fibrillation Needs assistance for meal preparation  Clinical Social Work Clinical Goal(s):  LCSW will talk with client in next 30 days to talk with her about client completion of ADLs and client completion of daily activities  Interventions: Talked with [name], grandson, about CCM program services Encouraged client or [name] to talk with RNCM as needed to discuss nursing needs of client  Talked with [name] about pain issues of client Talked with [name] about social support network (support from her daughter and from her grandson) Talked with [name] about meal provision for client Talked  with [name] about client completion of ADLs Talked with [name] about client's decreased energy upon exertion Talked with [name] about client use of oxygen to help her breathe Talked with [name] about vision challenges of client Talked with [name], grandson, about DME of client (she has oxygen concentrator,  long tubing, she has a transport chair, she has [employer] portable oxygen system,has tub seat, has a 3 in 1 bedside commode) Talked with [name] about client's upcoming medical appointments  Patient Self Care Activities:   Eats with  set up assistance Takes medications as prescribed  Patient Self Care Deficits: Mobility issues Need for oxygen to help her breathe  Initial goal documentation    Follow Up Plan: LCSW will call client/[name] Dalton, grandson in next 4 weeks to talk with client/[name] about client completion of ADLs and daily activities  [name].Brendia Dampier MSW, LCSW Licensed Clinical Social Worker [facility] Care Management [phone]  I have reviewed and agree with the above documentation.   Evelina Dun, FNP

## 2020-06-02 DIAGNOSIS — R0602 Shortness of breath: Secondary | ICD-10-CM | POA: Diagnosis not present

## 2020-06-02 DIAGNOSIS — I509 Heart failure, unspecified: Secondary | ICD-10-CM | POA: Diagnosis not present

## 2020-06-11 DIAGNOSIS — R0602 Shortness of breath: Secondary | ICD-10-CM | POA: Diagnosis not present

## 2020-06-11 DIAGNOSIS — R609 Edema, unspecified: Secondary | ICD-10-CM | POA: Diagnosis not present

## 2020-06-11 DIAGNOSIS — I509 Heart failure, unspecified: Secondary | ICD-10-CM | POA: Diagnosis not present

## 2020-06-17 ENCOUNTER — Other Ambulatory Visit: Payer: Self-pay | Admitting: Family

## 2020-06-18 ENCOUNTER — Telehealth: Payer: Self-pay | Admitting: Family

## 2020-06-18 ENCOUNTER — Telehealth: Payer: Self-pay | Admitting: Cardiovascular Disease

## 2020-06-18 DIAGNOSIS — M810 Age-related osteoporosis without current pathological fracture: Secondary | ICD-10-CM

## 2020-06-18 MED ORDER — RALOXIFENE HCL 60 MG PO TABS: 60.0000 mg | ORAL_TABLET | Freq: Every day | ORAL | 2 refills | Status: DC

## 2020-06-18 NOTE — Telephone Encounter (Signed)
New Message    *STAT* If patient is at the pharmacy, call can be transferred to refill team.   1. Which medications need to be refilled? (please list name of each medication and dose if known) apixaban (ELIQUIS) 5 MG TABS tablet  2. Which pharmacy/location (including street and city if local pharmacy) is medication to be sent to? [facility] in [place]   3. Do they need a 30 day or 90 day supply? 95   Pts daughter says the pt needs the medication sent to [facility] because they are using a good rx card for the prescription

## 2020-06-18 NOTE — Telephone Encounter (Signed)
Aware and verbalizes understanding.  

## 2020-06-18 NOTE — Telephone Encounter (Signed)
Pt is in the doughnut whole with raloxifene (EVISTA) 60 MG please send refill to [facility] because it will be cheaper.

## 2020-06-21 ENCOUNTER — Telehealth: Payer: Self-pay | Admitting: Family

## 2020-06-21 ENCOUNTER — Other Ambulatory Visit: Payer: Self-pay | Admitting: Family

## 2020-06-21 MED ORDER — APIXABAN 5 MG PO TABS: 5.0000 mg | ORAL_TABLET | Freq: Two times a day (BID) | ORAL | 5 refills | Status: DC

## 2020-06-21 NOTE — Telephone Encounter (Signed)
°  Prescription Request  06/21/2020  What is the name of the medication or equipment? apixaban (ELIQUIS) 5 MG TABS tablet  Have you contacted your pharmacy to request a refill? (if applicable) no  Which pharmacy would you like this sent to? [facility]   Patient notified that their request is being sent to the clinical staff for review and that they should receive a response within 2 business days.

## 2020-06-21 NOTE — Telephone Encounter (Signed)
Refill sent in to [facility] per request.

## 2020-06-21 NOTE — Telephone Encounter (Signed)
RF sent.

## 2020-06-23 ENCOUNTER — Telehealth: Payer: Self-pay

## 2020-07-03 DIAGNOSIS — I509 Heart failure, unspecified: Secondary | ICD-10-CM | POA: Diagnosis not present

## 2020-07-03 DIAGNOSIS — R0602 Shortness of breath: Secondary | ICD-10-CM | POA: Diagnosis not present

## 2020-07-12 DIAGNOSIS — I509 Heart failure, unspecified: Secondary | ICD-10-CM | POA: Diagnosis not present

## 2020-07-12 DIAGNOSIS — R609 Edema, unspecified: Secondary | ICD-10-CM | POA: Diagnosis not present

## 2020-07-12 DIAGNOSIS — R0602 Shortness of breath: Secondary | ICD-10-CM | POA: Diagnosis not present

## 2020-07-15 ENCOUNTER — Other Ambulatory Visit: Payer: Self-pay | Admitting: Family

## 2020-07-28 ENCOUNTER — Telehealth: Payer: Medicare Other

## 2020-08-03 DIAGNOSIS — R0602 Shortness of breath: Secondary | ICD-10-CM | POA: Diagnosis not present

## 2020-08-03 DIAGNOSIS — I509 Heart failure, unspecified: Secondary | ICD-10-CM | POA: Diagnosis not present

## 2020-08-12 DIAGNOSIS — I509 Heart failure, unspecified: Secondary | ICD-10-CM | POA: Diagnosis not present

## 2020-08-12 DIAGNOSIS — R609 Edema, unspecified: Secondary | ICD-10-CM | POA: Diagnosis not present

## 2020-08-12 DIAGNOSIS — R0602 Shortness of breath: Secondary | ICD-10-CM | POA: Diagnosis not present

## 2020-08-23 ENCOUNTER — Telehealth: Payer: Self-pay | Admitting: *Deleted

## 2020-08-23 MED ORDER — MUPIROCIN 2 % EX OINT
1.0000 | TOPICAL_OINTMENT | Freq: Two times a day (BID) | CUTANEOUS | 2 refills | Status: DC
Start: 2020-08-23 — End: 2020-12-15

## 2020-08-23 NOTE — Telephone Encounter (Signed)
received medication refill request- sent to [facility]

## 2020-08-27 ENCOUNTER — Other Ambulatory Visit: Payer: Self-pay | Admitting: Family

## 2020-08-27 NOTE — Progress Notes (Signed)
CARDIOLOGY OFFICE NOTE  Date:  08/30/2020    Paige Ferguson Date of Birth: 02/25/1921 Medical Record #[id number]  PCP:  Sharion Balloon, FNP  Cardiologist:  Angelena Form    Chief Complaint  Patient presents with  . Follow-up    Seen for Dr. Angelena Form    History of Present Illness: Paige Ferguson is a 86 y.o. female who presents today for a follow up visit. Seen for Dr. Angelena Form.   She has a history of persistent AF, CKD, HLD, HTN and prior TIA. EF has been normal with no valve disease.   Last seen in May by Dr. Angelena Form. She noted weakness that she felt was from Eliquis.   Comes in today. Here with her daughter. She is doing well. She is on continuous oxygen. No chest pain. Breathing felt to be stable. Daughter notes some shortwindedness at times. No falls. She has pretty much around the clock care. Tolerating her medicines. No recent labs. Seeing PCP soon. Tolerating her Eliquis - no bleeding. No swelling. Weight is down about 10 pounds over the past year - she notes she eats less. Overall, no real concerns.    Past Medical History:  Diagnosis Date  . Allergy   . Arthritis of knee   . Cataract   . Cholelithiases   . Chronic kidney disease   . Endometrial polyp 7/98   2 degree polyps   . Genu valgum   . Gout   . Hyperlipidemia   . Hypertension   . Left knee pain   . Osteoarthritis   . Osteoporosis   . Postmenopausal bleeding   . Recurrent UTI   . Renal abscess, right   . Shoulder fracture, left   . TIA (transient ischemic attack)   . Ureteral calculi   . URI (upper respiratory infection)     Past Surgical History:  Procedure Laterality Date  . EYE SURGERY    . HERNIA REPAIR    . HYSTEROSCOPY , FRACTIONAL D&C  7/98/  . [facility]  . LEFT CATARACT SURGERY  10/99  . RIGHT CATARACT SURGERY  7/99   Dr. Dolores Lory   . RIGHT HEMINEPHRECTOMY FOR ABSCESS  2/98  . RIGHT NEPHRELITHOTOMY  1982     Medications: Current Meds  Medication Sig  .  amLODipine (NORVASC) 5 MG tablet Take 1 tablet (5 mg total) by mouth daily.  Marland Kitchen apixaban (ELIQUIS) 5 MG TABS tablet Take 1 tablet (5 mg total) by mouth 2 (two) times daily.  . benazepril (LOTENSIN) 40 MG tablet Take 1 tablet (40 mg total) by mouth daily.  . Calcium-Magnesium-Vitamin D (CALCIUM 1200+D3 PO) Take by mouth.  . carvedilol (COREG) 6.25 MG tablet Take 1 tablet (6.25 mg total) by mouth 2 (two) times daily with a meal.  . furosemide (LASIX) 40 MG tablet Take 1 tablet (40 mg total) by mouth 2 (two) times daily. (Needs to be seen before next refill)  . meloxicam (MOBIC) 7.5 MG tablet Take 1 tablet (7.5 mg total) by mouth daily.  . Misc Natural Products (GNP GLUCOSAME CHONDROITIN DS) TABS Take by mouth.  . Multiple Vitamins-Minerals (CENTRUM SILVER ADULT 50+) TABS Take 1 tablet by mouth daily.  . mupirocin ointment (BACTROBAN) 2 % Apply 1 application topically 2 (two) times daily.  . Omega-3 Fatty Acids (FISH OIL) 1000 MG CAPS Take 1 capsule by mouth 2 (two) times daily.  . raloxifene (EVISTA) 60 MG tablet Take 1 tablet (60 mg total) by mouth  daily.  . vitamin E 200 UNIT capsule Take 200 Units by mouth daily.     Allergies: Allergies  Allergen Reactions  . Sulfa Antibiotics Nausea And Vomiting  . Amoxicillin   . Nitrofurantoin Monohyd Macro     Social History: The patient  reports that she quit smoking about 75 years ago. Her smoking use included cigarettes. She has never used smokeless tobacco. She reports that she does not drink alcohol and does not use drugs.   Family History: The patient's family history includes Coronary artery disease in her paternal grandfather; Diabetes in her maternal grandmother; Heart defect in her son; Hypertension in her maternal grandmother; Pneumonia in her father; Stroke in her maternal grandmother.   Review of Systems: Please see the history of present illness.   All other systems are reviewed and negative.   Physical Exam: VS:  BP 120/90  Comment: 150/86 in the right arm  Pulse 86   Ht 4\' 8"  (1.422 m)   Wt 134 lb (60.8 kg)   SpO2 99%   BMI 30.04 kg/m  .  BMI Body mass index is 30.04 kg/m.  Wt Readings from Last 3 Encounters:  08/30/20 134 lb (60.8 kg)  03/25/20 139 lb (63 kg)  09/19/19 143 lb (64.9 kg)    General: Pleasant. Elderly. Alert and in no acute distress. She is in a wheelchair - she has oxygen in place.  Cardiac: Irregular irregular rhythm. Her rate is fine. No significant edema.  Respiratory:  Lungs are clear to auscultation bilaterally with normal work of breathing.  GI: Soft and nontender.  MS: No deformity or atrophy. Gait not tested.  Skin: Warm and dry. Color is normal.  Neuro:  Strength and sensation are intact and no gross focal deficits noted.  Psych: Alert, appropriate and with normal affect.   LABORATORY DATA:  EKG:  EKG is ordered today.  Personally reviewed by me. This demonstrates AF with controlled VR of 86 today.  Lab Results  Component Value Date   WBC 6.1 08/28/2019   HGB 11.3 08/28/2019   HCT 33.6 (L) 08/28/2019   PLT 247 08/28/2019   GLUCOSE 110 (H) 08/28/2019   CHOL 137 01/01/2019   TRIG 58 01/01/2019   HDL 44 01/01/2019   LDLCALC 81 01/01/2019   ALT 13 06/26/2019   AST 25 06/26/2019   NA 148 (H) 08/28/2019   K 4.2 08/28/2019   CL 103 08/28/2019   CREATININE 0.94 08/28/2019   BUN 25 08/28/2019   CO2 31 (H) 08/28/2019     BNP (last 3 results) No results for input(s): BNP in the last 8760 hours.  ProBNP (last 3 results) No results for input(s): PROBNP in the last 8760 hours.   Other Studies Reviewed Today:  ECHO IMPRESSIONS 05/2019  1. The left ventricle has normal systolic function with an ejection  fraction of 60-65%. The cavity size was normal. Left ventricular diastolic  Doppler parameters are consistent with impaired relaxation.  2. The right ventricle has normal systolic function. The cavity was  normal. There is no increase in right ventricular wall  thickness.  3. Left atrial size was moderately dilated.  4. Mild thickening of the mitral valve leaflet. Mild calcification of the  mitral valve leaflet.  5. The inferior vena cava was dilated in size with >50% respiratory  variability.    Assessment/Plan:  1. Persistent AF - managed with rate control and continued anticoagulation - doing ok from our standpoint - would continue on her current regimen.  Her rate is ok today on Coreg - would continue.   2. Chronic anticoagulation - no problems noted - still on full dose - recheck labs today. Continue current dose of Eliquis.   3. Presumed diastolic HF - has had normal EF by echo last year. Looks euvolemic on exam today - her weight has declined. Would continue her ACE, beta blocker and Lasix.   Current medicines are reviewed with the patient today.  The patient does not have concerns regarding medicines other than what has been noted above.  The following changes have been made:  See above.  Labs/ tests ordered today include:    Orders Placed This Encounter  Procedures  . Basic metabolic panel  . CBC  . EKG 12-Lead     Disposition:   FU with Dr. Angelena Form in about 5 months.    Patient is agreeable to this plan and will call if any problems develop in the interim.   SignedTruitt Merle, NP  08/30/2020 3:38 PM  [facility] [address] [place], [place]  [address] Phone: [phone] Fax: [phone]

## 2020-08-30 ENCOUNTER — Encounter: Payer: Self-pay | Admitting: Nurse Practitioner

## 2020-08-30 ENCOUNTER — Other Ambulatory Visit: Payer: Self-pay

## 2020-08-30 ENCOUNTER — Ambulatory Visit: Payer: Medicare Other | Admitting: Nurse Practitioner

## 2020-08-30 VITALS — BP 120/90 | HR 86 | Ht <= 58 in | Wt 134.0 lb

## 2020-08-30 DIAGNOSIS — Z7901 Long term (current) use of anticoagulants: Secondary | ICD-10-CM

## 2020-08-30 DIAGNOSIS — I4819 Other persistent atrial fibrillation: Secondary | ICD-10-CM | POA: Diagnosis not present

## 2020-08-30 DIAGNOSIS — I5032 Chronic diastolic (congestive) heart failure: Secondary | ICD-10-CM

## 2020-08-30 NOTE — Patient Instructions (Addendum)
After Visit Summary:  We will be checking the following labs today - BMET & CBC   Medication Instructions:    Continue with your current medicines.    If you need a refill on your cardiac medications before your next appointment, please call your pharmacy.     Testing/Procedures To Be Arranged:  N/A  Follow-Up:   See Dr. Angelena Form in about 5 months.     At [facility], you and your health needs are our priority.  As part of our continuing mission to provide you with exceptional heart care, we have created designated Provider Care Teams.  These Care Teams include your primary Cardiologist (physician) and Advanced Practice Providers (APPs -  Physician Assistants and Nurse Practitioners) who all work together to provide you with the care you need, when you need it.  Special Instructions:  . Stay safe, wash your hands for at least 20 seconds and wear a mask when needed.  . It was good to talk with you both today.    Call the [place] office at [phone] if you have any questions, problems or concerns.

## 2020-08-31 ENCOUNTER — Telehealth: Payer: Medicare Other

## 2020-08-31 LAB — CBC
Hematocrit: 34.9 % (ref 34.0–46.6)
Hemoglobin: 11.7 g/dL (ref 11.1–15.9)
MCH: 34.4 pg — ABNORMAL HIGH (ref 26.6–33.0)
MCHC: 33.5 g/dL (ref 31.5–35.7)
MCV: 103 fL — ABNORMAL HIGH (ref 79–97)
Platelets: 225 10*3/uL (ref 150–450)
RBC: 3.4 x10E6/uL — ABNORMAL LOW (ref 3.77–5.28)
RDW: 13.4 % (ref 11.7–15.4)
WBC: 6.3 10*3/uL (ref 3.4–10.8)

## 2020-08-31 LAB — BASIC METABOLIC PANEL
BUN/Creatinine Ratio: 29 — ABNORMAL HIGH (ref 12–28)
BUN: 23 mg/dL (ref 10–36)
CO2: 32 mmol/L — ABNORMAL HIGH (ref 20–29)
Calcium: 9.3 mg/dL (ref 8.7–10.3)
Chloride: 101 mmol/L (ref 96–106)
Creatinine, Ser: 0.78 mg/dL (ref 0.57–1.00)
GFR calc Af Amer: 74 mL/min/{1.73_m2} (ref >59)
GFR calc non Af Amer: 64 mL/min/{1.73_m2} (ref >59)
Glucose: 98 mg/dL (ref 65–99)
Potassium: 4.6 mmol/L (ref 3.5–5.2)
Sodium: 144 mmol/L (ref 134–144)

## 2020-09-02 ENCOUNTER — Telehealth: Payer: Self-pay | Admitting: Nurse Practitioner

## 2020-09-02 DIAGNOSIS — I509 Heart failure, unspecified: Secondary | ICD-10-CM | POA: Diagnosis not present

## 2020-09-02 DIAGNOSIS — R0602 Shortness of breath: Secondary | ICD-10-CM | POA: Diagnosis not present

## 2020-09-02 NOTE — Telephone Encounter (Signed)
Patient returned call for lab results.  

## 2020-09-02 NOTE — Telephone Encounter (Signed)
Attempted to call pt X 2 to review lab results.  Phone clicks then call is disconnected.  Will attempt again later.

## 2020-09-02 NOTE — Telephone Encounter (Signed)
Ok to report. Her labs are stable - copy to her PCP please.  No change with current regimen.    Reviewed lab results with pt who states understanding.

## 2020-09-11 DIAGNOSIS — R0602 Shortness of breath: Secondary | ICD-10-CM | POA: Diagnosis not present

## 2020-09-11 DIAGNOSIS — I509 Heart failure, unspecified: Secondary | ICD-10-CM | POA: Diagnosis not present

## 2020-09-11 DIAGNOSIS — R609 Edema, unspecified: Secondary | ICD-10-CM | POA: Diagnosis not present

## 2020-09-15 ENCOUNTER — Ambulatory Visit: Payer: Medicare Other | Admitting: Licensed Clinical Social Worker

## 2020-09-15 DIAGNOSIS — G459 Transient cerebral ischemic attack, unspecified: Secondary | ICD-10-CM

## 2020-09-15 DIAGNOSIS — M170 Bilateral primary osteoarthritis of knee: Secondary | ICD-10-CM

## 2020-09-15 DIAGNOSIS — E782 Mixed hyperlipidemia: Secondary | ICD-10-CM

## 2020-09-15 DIAGNOSIS — I10 Essential (primary) hypertension: Secondary | ICD-10-CM

## 2020-09-15 DIAGNOSIS — I509 Heart failure, unspecified: Secondary | ICD-10-CM

## 2020-09-15 NOTE — Patient Instructions (Addendum)
Licensed Clinical Social Worker Visit Information  Goals we discussed today:   .  Client will talk with LCSW in next 30 days to discuss client completion of ADLs and daily activities (pt-stated)       CARE PLAN ENTRY   Current Barriers:   Mobility issues (uses a walker to help her walk)  Risk for falls in client with Chronic Diagnoses of HTN, HLD, OA, TIA, CHF, Atrial Fibrillation  Needs assistance for meal preparation  Clinical Social Work Clinical Goal(s):   LCSW will talk with client in next 30 days to talk with her about client completion of ADLs and client completion of daily activities  Interventions:  Talked with Paige Ferguson, grandson, about CCM program services  Encouraged client/Paige to talk with RNCM as needed to discuss nursing needs of client   Talked with Paige Ferguson about about pain issues of client  Talked with Paige Ferguson about social support network of client (support from her daughter and from her grandson)  Talked with Paige Ferguson about mood of client  Talked with Paige Ferguson about decreased energy of client    Talked with Paige Ferguson about client use of oxygen to help her breathe  Talked with Paige Ferguson about client use of [employer] patient catalogue quarterly   Talked with Paige Ferguson about vision challenges of client  Talked with Paige Ferguson about client completion of ADLs  Talked with Paige Ferguson about client use of compression hose  Talked with Paige Ferguson about transport needs of client  Talked with Paige Ferguson about upcoming client medical appointments  Patient Self Care Activities:   Eats with set up assistance Takes medications as prescribed  Patient Self Care Deficits:  Mobility issues  Need for oxygen to help her breathe  Initial goal documentation     Follow Up Plan: LCSW will call [name], grandsonin next 4 weeks to talk with client/Josephabout client completion of ADLs and daily activities  Materials Provided: No  The patient/Paige Ferguson,  grandson, verbalized understanding of instructions provided today and declined a print copy of patient instruction materials.   Paige Ferguson.Paige Ferguson MSW, LCSW Licensed Clinical Social Worker [facility] Care Management [phone]

## 2020-09-15 NOTE — Chronic Care Management (AMB) (Addendum)
Chronic Care Management    Clinical Social Work Follow Up Note  09/15/2020 Name: Paige Ferguson MRN: [id number] DOB: 1921-08-14  Paige Ferguson is a 86 y.o. year old female who is a primary care patient of Sharion Balloon, FNP. The CCM team was consulted for assistance with [name] .   Review of patient status, including review of consultants reports, other relevant assessments, and collaboration with appropriate care team members and the patient's provider was performed as part of comprehensive patient evaluation and provision of chronic care management services.    SDOH (Social Determinants of Health) assessments performed: No; risk for depression; risk for tobacco use; risk for stress; risk for physical inactivity    Chronic Care Management from 04/14/2020 in [facility]  PHQ-9 Total Score 4          GAD 7 : Generalized Anxiety Score 04/14/2020  Nervous, Anxious, on Edge 0  Control/stop worrying 0  Worry too much - different things 0  Trouble relaxing 0  Restless 0  Easily annoyed or irritable 0  Afraid - awful might happen 0  Total GAD 7 Score 0  Anxiety Difficulty Somewhat difficult    Outpatient Encounter Medications as of 09/15/2020  Medication Sig   amLODipine (NORVASC) 5 MG tablet Take 1 tablet (5 mg total) by mouth daily.   apixaban (ELIQUIS) 5 MG TABS tablet Take 1 tablet (5 mg total) by mouth 2 (two) times daily.   benazepril (LOTENSIN) 40 MG tablet Take 1 tablet (40 mg total) by mouth daily.   Calcium-Magnesium-Vitamin D (CALCIUM 1200+D3 PO) Take by mouth.   carvedilol (COREG) 6.25 MG tablet Take 1 tablet (6.25 mg total) by mouth 2 (two) times daily with a meal.   furosemide (LASIX) 40 MG tablet Take 1 tablet (40 mg total) by mouth 2 (two) times daily. (Needs to be seen before next refill)   meloxicam (MOBIC) 7.5 MG tablet Take 1 tablet (7.5 mg total) by mouth daily.   Misc Natural Products (GNP GLUCOSAME CHONDROITIN DS) TABS Take by  mouth.   Multiple Vitamins-Minerals (CENTRUM SILVER ADULT 50+) TABS Take 1 tablet by mouth daily.   mupirocin ointment (BACTROBAN) 2 % Apply 1 application topically 2 (two) times daily.   Omega-3 Fatty Acids (FISH OIL) 1000 MG CAPS Take 1 capsule by mouth 2 (two) times daily.   raloxifene (EVISTA) 60 MG tablet Take 1 tablet (60 mg total) by mouth daily.   vitamin E 200 UNIT capsule Take 200 Units by mouth daily.   No facility-administered encounter medications on file as of 09/15/2020.    Goals       Client will talk with LCSW in next 30 days to discuss client completion of ADLs and daily activities (pt-stated)      CARE PLAN ENTRY   Current Barriers:  Mobility issues (uses a walker to help her walk) Risk for falls in client with Chronic Diagnoses of HTN, HLD, OA, TIA, CHF, Atrial Fibrillation Needs assistance for meal preparation  Clinical Social Work Clinical Goal(s):  LCSW will talk with client in next 30 days to talk with her about client completion of ADLs and client completion of daily activities  Interventions: Talked with [name], grandson, about CCM program services Encouraged client/Paige to talk with RNCM as needed to discuss nursing needs of client  Talked with [name] about about pain issues of client Talked with [name] about social support network of client (support from her daughter and from her grandson) Talked  with [name] about mood of client Talked with [name] about decreased energy of client   Talked with [name] about client use of oxygen to help her breathe Talked with [name] about client use of [employer] patient catalogue quarterly  Talked with [name] about vision challenges of client Talked with [name] about client completion of ADLs Talked with [name] about client use of compression hose Talked with [name] about transport needs of client Talked with [name] about upcoming client medical appointments  Patient Self Care Activities:   Eats with set up  assistance Takes medications as prescribed  Patient Self Care Deficits: Mobility issues Need for oxygen to help her breathe  Initial goal documentation     Follow Up Plan:  LCSW will call [name], grandson in next 4 weeks to talk with client/Paige about client completion of ADLs and daily activities   [name].Salem Lembke MSW, LCSW Licensed Clinical Social Worker [facility] Care [facility] [phone]  I have reviewed the CCM documentation and agree with the written assessment and plan of care.  Evelina Dun, FNP

## 2020-09-22 ENCOUNTER — Telehealth: Payer: Self-pay | Admitting: Family Medicine

## 2020-09-22 ENCOUNTER — Telehealth: Payer: Self-pay

## 2020-09-22 DIAGNOSIS — I10 Essential (primary) hypertension: Secondary | ICD-10-CM

## 2020-09-22 MED ORDER — AMLODIPINE BESYLATE 5 MG PO TABS: 5.0000 mg | ORAL_TABLET | Freq: Every day | ORAL | 0 refills | Status: DC

## 2020-09-22 MED ORDER — FUROSEMIDE 40 MG PO TABS
40.0000 mg | ORAL_TABLET | Freq: Two times a day (BID) | ORAL | 0 refills | Status: DC
Start: 2020-09-22 — End: 2020-10-19

## 2020-09-22 NOTE — Telephone Encounter (Signed)
Refills sent

## 2020-09-22 NOTE — Telephone Encounter (Signed)
Daughter aware.

## 2020-09-22 NOTE — Telephone Encounter (Signed)
Pts daughter called to confirm date/time of pt's appt for check up with [facility]. Said pt told her that she got a call from our office yesterday wanting to know if pt could be seen today instead of tomorrow.  After review, I saw that our automated system called pt to confirm her appt on 09/23/20 and pt cancelled the appt via auto system.  Pt says she was unaware that she did that and she thought her appt was moved to today. Pt confused.  Daughter rescheduled her appt on 10/19/20 ([name] available) and said she is out of her Amlodipine Rx and needs refill sent to pharmacy to last her until she can come in.

## 2020-09-23 ENCOUNTER — Ambulatory Visit: Payer: Medicare Other | Admitting: Family

## 2020-09-27 ENCOUNTER — Ambulatory Visit: Payer: Medicare Other | Admitting: Nurse Practitioner

## 2020-10-03 DIAGNOSIS — I509 Heart failure, unspecified: Secondary | ICD-10-CM | POA: Diagnosis not present

## 2020-10-03 DIAGNOSIS — R0602 Shortness of breath: Secondary | ICD-10-CM | POA: Diagnosis not present

## 2020-10-06 ENCOUNTER — Other Ambulatory Visit: Payer: Self-pay | Admitting: Family

## 2020-10-06 DIAGNOSIS — I10 Essential (primary) hypertension: Secondary | ICD-10-CM

## 2020-10-12 DIAGNOSIS — R0602 Shortness of breath: Secondary | ICD-10-CM | POA: Diagnosis not present

## 2020-10-12 DIAGNOSIS — I509 Heart failure, unspecified: Secondary | ICD-10-CM | POA: Diagnosis not present

## 2020-10-12 DIAGNOSIS — R609 Edema, unspecified: Secondary | ICD-10-CM | POA: Diagnosis not present

## 2020-10-18 ENCOUNTER — Ambulatory Visit: Payer: Medicare Other | Admitting: Licensed Clinical Social Worker

## 2020-10-18 DIAGNOSIS — I10 Essential (primary) hypertension: Secondary | ICD-10-CM

## 2020-10-18 DIAGNOSIS — I509 Heart failure, unspecified: Secondary | ICD-10-CM

## 2020-10-18 DIAGNOSIS — I4891 Unspecified atrial fibrillation: Secondary | ICD-10-CM

## 2020-10-18 DIAGNOSIS — G459 Transient cerebral ischemic attack, unspecified: Secondary | ICD-10-CM

## 2020-10-18 DIAGNOSIS — M170 Bilateral primary osteoarthritis of knee: Secondary | ICD-10-CM

## 2020-10-18 DIAGNOSIS — E782 Mixed hyperlipidemia: Secondary | ICD-10-CM

## 2020-10-18 NOTE — Chronic Care Management (AMB) (Signed)
Chronic Care Management    Clinical Social Work Follow Up Note  10/18/2020 Name: Paige Ferguson MRN: [id number] DOB: [birth year]  Paige Ferguson is a 86 y.o. year old female who is a primary care patient of Paige Balloon, FNP. The CCM team was consulted for assistance with Paige Ferguson .   Review of patient status, including review of consultants reports, other relevant assessments, and collaboration with appropriate care team members and the patient's provider was performed as part of comprehensive patient evaluation and provision of chronic care management services.    SDOH (Social Determinants of Health) assessments performed: No; risk for tobacco use; risk for depression; risk for stress; risk for physical inactivity    Chronic Care Management from 04/14/2020 in [facility]  PHQ-9 Total Score 4       GAD 7 : Generalized Anxiety Score 04/14/2020  Nervous, Anxious, on Edge 0  Control/stop worrying 0  Worry too much - different things 0  Trouble relaxing 0  Restless 0  Easily annoyed or irritable 0  Afraid - awful might happen 0  Total GAD 7 Score 0  Anxiety Difficulty Somewhat difficult    Outpatient Encounter Medications as of 10/18/2020  Medication Sig  . amLODipine (NORVASC) 5 MG tablet Take 1 tablet (5 mg total) by mouth daily.  Marland Kitchen apixaban (ELIQUIS) 5 MG TABS tablet Take 1 tablet (5 mg total) by mouth 2 (two) times daily.  . benazepril (LOTENSIN) 40 MG tablet TAKE 1 TABLET DAILY  . Calcium-Magnesium-Vitamin D (CALCIUM 1200+D3 PO) Take by mouth.  . carvedilol (COREG) 6.25 MG tablet Take 1 tablet (6.25 mg total) by mouth 2 (two) times daily with a meal.  . furosemide (LASIX) 40 MG tablet Take 1 tablet (40 mg total) by mouth 2 (two) times daily. (Needs to be seen before next refill)  . meloxicam (MOBIC) 7.5 MG tablet Take 1 tablet (7.5 mg total) by mouth daily.  . Misc Natural Products (GNP GLUCOSAME CHONDROITIN DS) TABS Take by mouth.  . Multiple  Vitamins-Minerals (CENTRUM SILVER ADULT 50+) TABS Take 1 tablet by mouth daily.  . mupirocin ointment (BACTROBAN) 2 % Apply 1 application topically 2 (two) times daily.  . Omega-3 Fatty Acids (FISH OIL) 1000 MG CAPS Take 1 capsule by mouth 2 (two) times daily.  . raloxifene (EVISTA) 60 MG tablet Take 1 tablet (60 mg total) by mouth daily.  . vitamin E 200 UNIT capsule Take 200 Units by mouth daily.   No facility-administered encounter medications on file as of 10/18/2020.  .. Goals    .  Client will talk with LCSW in next 30 days to discuss client completion of ADLs and daily activities (pt-stated)      CARE PLAN ENTRY   Current Barriers:  Marland Kitchen Mobility issues (uses a walker to help her walk) . Risk for falls in client with Chronic Diagnoses of HTN, HLD, OA, TIA, CHF, Atrial Fibrillation . Needs assistance for meal preparation  Clinical Social Work Clinical Goal(s):  Marland Kitchen LCSW will talk with client in next 30 days to talk with her about client completion of ADLs and client completion of daily activities  Interventions:  Talked with Paige Ferguson, grandson, about CCM program services  Encouraged client/Paige to talk with RNCM as needed to discuss nursing needs of client   Talked with Paige Ferguson about about pain issues of client  Talked with Paige Ferguson about social support network of client (support from her daughter and from her grandson)  Talked with  Paige Ferguson about mood of client  Talked with Paige Ferguson about decreased energy of client    Talked with Paige Ferguson about client use of oxygen to help her breathe  Talked with Paige Ferguson about vision challenges of client  Talked with Paige Ferguson about client completion of ADLs Paige Ferguson said he helps client get dressed in the mornings)  Talked with Paige Ferguson about client use of compression hose  Talked with Paige Ferguson about transport needs of client  Talked with Paige Ferguson about upcoming client medical appointments ( client has an appointment tomorrow to see Paige Ferguson  at [facility])  Talked with Paige Ferguson about ambulation of client  Talked with Paige Ferguson about appetite of client  Talked with Paige Ferguson about sleeping issues of client  Talked with Paige Ferguson about relaxation techniques of client (client likes to read, likes to visit with family)  Talked with Paige Ferguson about communication of client  Talked with Paige Ferguson about client use of Lift Chair in the home  Patient Self Care Activities:   Eats with set up assistance Takes medications as prescribed  Patient Self Care Deficits: Marland Kitchen Mobility issues . Need for oxygen to help her breathe  Initial goal documentation     Follow Up Plan: LCSW will call [name], grandsonin next 4 weeks to talk with client/Josephabout client completion of ADLs and daily activities  Paige Ferguson MSW, LCSW Licensed Clinical Social Worker [facility] [phone]

## 2020-10-18 NOTE — Patient Instructions (Addendum)
Licensed Clinical Social Worker Visit Information  Goals we discussed today:    Client will talk with LCSW in next 30 days to discuss client completion of ADLs and daily activities (pt-stated)         CARE PLAN ENTRY   Current Barriers:   Mobility issues (uses a walker to help her walk)  Risk for falls in client with Chronic Diagnoses of HTN, HLD, OA, TIA, CHF, Atrial Fibrillation  Needs assistance for meal preparation  Clinical Social Work Clinical Goal(s):   LCSW will talk with client in next 30 days to talk with her about client completion of ADLs and client completion of daily activities  Interventions:  Talked withJoseph Kirk Ferguson, [employer] CCM program services  Encouraged client/Josephto talk with RNCM as needed to discuss nursing needs of client   Talked withJoseph aboutabout pain issues of client  Talked withJosephabout social support networkof client(support from her daughter and from her grandson)  Talked with Paige Ferguson about mood of client  Talked withJosephaboutdecreased energyof client  Talked withJoseph about clientuse of oxygen to help her breathe  Talked withJosephabout vision challenges of client  Talked with Paige Ferguson about client completion of ADLs Paige Ferguson said he helps client get dressed in the mornings)  Talked with Paige Ferguson about client use of compression hose  Talked with Paige Ferguson about transport needs of client  Talked with Paige Ferguson about upcoming client medical appointments ( client has an appointment tomorrow to see Paige Ferguson at [facility])  Talked with Paige Ferguson about ambulation of client  Talked with Paige Ferguson about appetite of client  Talked with Paige Ferguson about sleeping issues of client  Talked with Paige Ferguson about relaxation techniques of client (client likes to read, likes to visit with family)  Talked with Paige Ferguson about communication of client  Talked with Paige Ferguson about client use of Lift Chair in the home  Patient Self  Care Activities:   Eats with set up assistance Takes medications as prescribed  Patient Self Care Deficits:  Mobility issues  Need for oxygen to help her breathe  Initial goal documentation     Follow Up Plan: LCSW will call [name], grandsonin next 4 weeks to talk with client/Josephabout client completion of ADLs and daily activities  Materials Provided: No  The patient/Paige Ferguson, grandson of client,  verbalized understanding of instructions provided today and declined a print copy of patient instruction materials.   [name].Paige Ferguson MSW, LCSW Licensed Clinical Social Worker [facility] [phone]

## 2020-10-19 ENCOUNTER — Other Ambulatory Visit: Payer: Self-pay

## 2020-10-19 ENCOUNTER — Encounter: Payer: Self-pay | Admitting: Family

## 2020-10-19 ENCOUNTER — Ambulatory Visit (INDEPENDENT_AMBULATORY_CARE_PROVIDER_SITE_OTHER): Payer: Medicare Other | Admitting: Family

## 2020-10-19 VITALS — BP 157/87 | HR 95 | Temp 98.9°F | Ht <= 58 in

## 2020-10-19 DIAGNOSIS — M1712 Unilateral primary osteoarthritis, left knee: Secondary | ICD-10-CM

## 2020-10-19 DIAGNOSIS — I10 Essential (primary) hypertension: Secondary | ICD-10-CM | POA: Diagnosis not present

## 2020-10-19 DIAGNOSIS — I4891 Unspecified atrial fibrillation: Secondary | ICD-10-CM | POA: Diagnosis not present

## 2020-10-19 DIAGNOSIS — H6121 Impacted cerumen, right ear: Secondary | ICD-10-CM

## 2020-10-19 DIAGNOSIS — I509 Heart failure, unspecified: Secondary | ICD-10-CM | POA: Diagnosis not present

## 2020-10-19 DIAGNOSIS — M810 Age-related osteoporosis without current pathological fracture: Secondary | ICD-10-CM | POA: Diagnosis not present

## 2020-10-19 DIAGNOSIS — E669 Obesity, unspecified: Secondary | ICD-10-CM

## 2020-10-19 DIAGNOSIS — M170 Bilateral primary osteoarthritis of knee: Secondary | ICD-10-CM

## 2020-10-19 DIAGNOSIS — Z9981 Dependence on supplemental oxygen: Secondary | ICD-10-CM

## 2020-10-19 DIAGNOSIS — Z23 Encounter for immunization: Secondary | ICD-10-CM

## 2020-10-19 MED ORDER — RALOXIFENE HCL 60 MG PO TABS: 60.0000 mg | ORAL_TABLET | Freq: Every day | ORAL | 2 refills | Status: AC

## 2020-10-19 MED ORDER — MELOXICAM 7.5 MG PO TABS: 7.5000 mg | ORAL_TABLET | Freq: Every day | ORAL | 4 refills | Status: DC

## 2020-10-19 MED ORDER — BENAZEPRIL HCL 40 MG PO TABS: 40.0000 mg | ORAL_TABLET | Freq: Every day | ORAL | 2 refills | Status: AC

## 2020-10-19 MED ORDER — FUROSEMIDE 40 MG PO TABS: 40.0000 mg | ORAL_TABLET | Freq: Two times a day (BID) | ORAL | 2 refills | Status: DC

## 2020-10-19 MED ORDER — CARVEDILOL 6.25 MG PO TABS: 6.2500 mg | ORAL_TABLET | Freq: Two times a day (BID) | ORAL | 3 refills | Status: AC

## 2020-10-19 MED ORDER — AMLODIPINE BESYLATE 5 MG PO TABS: 5.0000 mg | ORAL_TABLET | Freq: Every day | ORAL | 2 refills | Status: AC

## 2020-10-19 MED ORDER — APIXABAN 5 MG PO TABS: 5.0000 mg | ORAL_TABLET | Freq: Two times a day (BID) | ORAL | 5 refills | Status: DC

## 2020-10-19 NOTE — Patient Instructions (Signed)
Earwax Buildup, Adult The ears produce a substance called earwax that helps keep bacteria out of the ear and protects the skin in the ear canal. Occasionally, earwax can build up in the ear and cause discomfort or hearing loss. What increases the risk? This condition is more likely to develop in people who:  Are female.  Are elderly.  Naturally produce more earwax.  Clean their ears often with cotton swabs.  Use earplugs often.  Use in-ear headphones often.  Wear hearing aids.  Have narrow ear canals.  Have earwax that is overly thick or sticky.  Have eczema.  Are dehydrated.  Have excess hair in the ear canal. What are the signs or symptoms? Symptoms of this condition include:  Reduced or muffled hearing.  A feeling of fullness in the ear or feeling that the ear is plugged.  Fluid coming from the ear.  Ear pain.  Ear itch.  Ringing in the ear.  Coughing.  An obvious piece of earwax that can be seen inside the ear canal. How is this diagnosed? This condition may be diagnosed based on:  Your symptoms.  Your medical history.  An ear exam. During the exam, your health care provider will look into your ear with an instrument called an otoscope. You may have tests, including a hearing test. How is this treated? This condition may be treated by:  Using ear drops to soften the earwax.  Having the earwax removed by a health care provider. The health care provider may: ? Flush the ear with water. ? Use an instrument that has a loop on the end (curette). ? Use a suction device.  Surgery to remove the wax buildup. This may be done in severe cases. Follow these instructions at home:   Take over-the-counter and prescription medicines only as told by your health care provider.  Do not put any objects, including cotton swabs, into your ear. You can clean the opening of your ear canal with a washcloth or facial tissue.  Follow instructions from your health care  provider about cleaning your ears. Do not over-clean your ears.  Drink enough fluid to keep your urine clear or pale yellow. This will help to thin the earwax.  Keep all follow-up visits as told by your health care provider. If earwax builds up in your ears often or if you use hearing aids, consider seeing your health care provider for routine, preventive ear cleanings. Ask your health care provider how often you should schedule your cleanings.  If you have hearing aids, clean them according to instructions from the manufacturer and your health care provider. Contact a health care provider if:  You have ear pain.  You develop a fever.  You have blood, pus, or other fluid coming from your ear.  You have hearing loss.  You have ringing in your ears that does not go away.  Your symptoms do not improve with treatment.  You feel like the room is spinning (vertigo). Summary  Earwax can build up in the ear and cause discomfort or hearing loss.  The most common symptoms of this condition include reduced or muffled hearing and a feeling of fullness in the ear or feeling that the ear is plugged.  This condition may be diagnosed based on your symptoms, your medical history, and an ear exam.  This condition may be treated by using ear drops to soften the earwax or by having the earwax removed by a health care provider.  Do not put any   objects, including cotton swabs, into your ear. You can clean the opening of your ear canal with a washcloth or facial tissue. This information is not intended to replace advice given to you by your health care provider. Make sure you discuss any questions you have with your health care provider. Document Revised: 10/19/2017 Document Reviewed: 01/17/2017 Elsevier Patient Education  2020 Elsevier Inc.  

## 2020-10-19 NOTE — Progress Notes (Signed)
Subjective:    Patient ID: Paige Ferguson, female    DOB: 1920/12/23, 86 y.o.   MRN: [id number]  Chief Complaint  Patient presents with  . Medical Management of Chronic Issues    FASTING   PT presents to the office today for chronic follow up. PT is currently on 3L of O2 continuous. She is followed by Cardiologists for A Fib and CHF. She reports her SOB has greatly improved since her fluid has improved.   She had an ECHO on 60-65%. She is currently taking Eliquis 5 mg daily. Hypertension This is a chronic problem. The current episode started more than 1 year ago. The problem has been waxing and waning since onset. Associated symptoms include malaise/fatigue and peripheral edema. Risk factors for coronary artery disease include dyslipidemia, obesity and sedentary lifestyle. The current treatment provides moderate improvement. Hypertensive end-organ damage includes CAD/MI and heart failure.  Congestive Heart Failure Presents for follow-up visit. Associated symptoms include fatigue. Pertinent negatives include no edema or unexpected weight change. The symptoms have been stable.  Arthritis Presents for follow-up visit. She complains of pain and stiffness. The symptoms have been stable. Affected locations include the left knee, right knee, left hip and right hip. Her pain is at a severity of 7/10. Associated symptoms include fatigue.      Review of Systems  Constitutional: Positive for fatigue and malaise/fatigue. Negative for unexpected weight change.  Musculoskeletal: Positive for arthritis and stiffness.  All other systems reviewed and are negative.      Objective:   Physical Exam Vitals reviewed.  Constitutional:      General: She is not in acute distress.    Appearance: She is well-developed.  HENT:     Head: Normocephalic and atraumatic.     Right Ear: There is impacted cerumen.     Left Ear: Tympanic membrane normal.  Eyes:     Pupils: Pupils are equal, round, and reactive  to light.  Neck:     Thyroid: No thyromegaly.  Cardiovascular:     Rate and Rhythm: Normal rate. Rhythm irregular.     Heart sounds: Normal heart sounds. No murmur heard.   Pulmonary:     Effort: Pulmonary effort is normal. No respiratory distress.     Breath sounds: Decreased breath sounds present. No wheezing.     Comments: 3 L  Abdominal:     General: Bowel sounds are normal. There is no distension.     Palpations: Abdomen is soft.     Tenderness: There is no abdominal tenderness.  Musculoskeletal:        General: No tenderness. Normal range of motion.     Cervical back: Normal range of motion and neck supple.     Right lower leg: Edema (3+) present.     Left lower leg: Edema (3+) present.  Skin:    General: Skin is warm and dry.  Neurological:     Mental Status: She is alert and oriented to person, place, and time.     Cranial Nerves: No cranial nerve deficit.     Deep Tendon Reflexes: Reflexes are normal and symmetric.  Psychiatric:        Behavior: Behavior normal.        Thought Content: Thought content normal.        Judgment: Judgment normal.      Right ear impacted with wax, curette used and large wax removed. TM WNL.      BP (!) 157/87   Pulse  95   Temp 98.9 F (37.2 C) (Temporal)   Ht _0  (1.422 m)   SpO2 98%   BMI 30.04 kg/m   Assessment & Plan:  Paige Ferguson comes in today with chief complaint of Medical Management of Chronic Issues (FASTING)   Diagnosis and orders addressed:  1. Essential hypertension - carvedilol (COREG) 6.25 MG tablet; Take 1 tablet (6.25 mg total) by mouth 2 (two) times daily with a meal.  Dispense: 180 tablet; Refill: 3 - benazepril (LOTENSIN) 40 MG tablet; Take 1 tablet (40 mg total) by mouth daily.  Dispense: 90 tablet; Refill: 2 - amLODipine (NORVASC) 5 MG tablet; Take 1 tablet (5 mg total) by mouth daily.  Dispense: 90 tablet; Refill: 2 - CMP14+EGFR - CBC with Differential/Platelet  2. Osteoporosis, unspecified  osteoporosis type, unspecified pathological fracture presence - raloxifene (EVISTA) 60 MG tablet; Take 1 tablet (60 mg total) by mouth daily.  Dispense: 90 tablet; Refill: 2 - CMP14+EGFR - CBC with Differential/Platelet  3. Need for immunization against influenza - Flu Vaccine QUAD High Dose(Fluad) - CMP14+EGFR - CBC with Differential/Platelet  4. Atrial fibrillation, unspecified type (HCC) - apixaban (ELIQUIS) 5 MG TABS tablet; Take 1 tablet (5 mg total) by mouth 2 (two) times daily.  Dispense: 60 tablet; Refill: 5 - CMP14+EGFR - CBC with Differential/Platelet  5. Congestive heart failure, unspecified HF chronicity, unspecified heart failure type (HCC) - furosemide (LASIX) 40 MG tablet; Take 1 tablet (40 mg total) by mouth 2 (two) times daily. (Needs to be seen before next refill)  Dispense: 180 tablet; Refill: 2 - CMP14+EGFR - CBC with Differential/Platelet  6. Primary hypertension - CMP14+EGFR - CBC with Differential/Platelet  7. Primary osteoarthritis of both knees - CMP14+EGFR - CBC with Differential/Platelet - meloxicam (MOBIC) 7.5 MG tablet; Take 1 tablet (7.5 mg total) by mouth daily.  Dispense: 90 tablet; Refill: 4  8. Obesity (BMI 30-39.9) - CMP14+EGFR - CBC with Differential/Platelet  9. On home oxygen therapy - CMP14+EGFR - CBC with Differential/Platelet  10. Impacted cerumen of right ear - CMP14+EGFR - CBC with Differential/Platelet  11. Arthritis of knee, left - meloxicam (MOBIC) 7.5 MG tablet; Take 1 tablet (7.5 mg total) by mouth daily.  Dispense: 90 tablet; Refill: 4   Labs pending Health Maintenance reviewed Diet and exercise encouraged  Follow up plan: 6 months    Evelina Dun, FNP

## 2020-10-20 LAB — CMP14+EGFR
ALT: 14 IU/L (ref 0–32)
AST: 22 IU/L (ref 0–40)
Albumin/Globulin Ratio: 1.5 (ref 1.2–2.2)
Albumin: 3.8 g/dL (ref 3.5–4.6)
Alkaline Phosphatase: 70 IU/L (ref 44–121)
BUN/Creatinine Ratio: 36 — ABNORMAL HIGH (ref 12–28)
BUN: 29 mg/dL (ref 10–36)
Bilirubin Total: 0.7 mg/dL (ref 0.0–1.2)
CO2: 30 mmol/L — ABNORMAL HIGH (ref 20–29)
Calcium: 9.3 mg/dL (ref 8.7–10.3)
Chloride: 100 mmol/L (ref 96–106)
Creatinine, Ser: 0.81 mg/dL (ref 0.57–1.00)
GFR calc Af Amer: 70 mL/min/{1.73_m2} (ref >59)
GFR calc non Af Amer: 61 mL/min/{1.73_m2} (ref >59)
Globulin, Total: 2.6 g/dL (ref 1.5–4.5)
Glucose: 98 mg/dL (ref 65–99)
Potassium: 3.8 mmol/L (ref 3.5–5.2)
Sodium: 144 mmol/L (ref 134–144)
Total Protein: 6.4 g/dL (ref 6.0–8.5)

## 2020-10-20 LAB — CBC WITH DIFFERENTIAL/PLATELET
Basophils Absolute: 0 10*3/uL (ref 0.0–0.2)
Basos: 0 %
EOS (ABSOLUTE): 0.1 10*3/uL (ref 0.0–0.4)
Eos: 2 %
Hematocrit: 34.8 % (ref 34.0–46.6)
Hemoglobin: 11.9 g/dL (ref 11.1–15.9)
Immature Grans (Abs): 0 10*3/uL (ref 0.0–0.1)
Immature Granulocytes: 0 %
Lymphocytes Absolute: 2.4 10*3/uL (ref 0.7–3.1)
Lymphs: 36 %
MCH: 33.5 pg — ABNORMAL HIGH (ref 26.6–33.0)
MCHC: 34.2 g/dL (ref 31.5–35.7)
MCV: 98 fL — ABNORMAL HIGH (ref 79–97)
Monocytes Absolute: 1 10*3/uL — ABNORMAL HIGH (ref 0.1–0.9)
Monocytes: 14 %
Neutrophils Absolute: 3.2 10*3/uL (ref 1.4–7.0)
Neutrophils: 48 %
Platelets: 242 10*3/uL (ref 150–450)
RBC: 3.55 x10E6/uL — ABNORMAL LOW (ref 3.77–5.28)
RDW: 13 % (ref 11.7–15.4)
WBC: 6.7 10*3/uL (ref 3.4–10.8)

## 2020-10-22 ENCOUNTER — Telehealth: Payer: Self-pay | Admitting: *Deleted

## 2020-10-22 NOTE — Telephone Encounter (Signed)
Per family member patient's BP went up to 157/117 this morning. Has come back down to 119/91. Pulse 98. Difficulty breathing even with 3 liters oxygen.   Per Alyse Low, advised to have patient try to relax and slow down breathing as it could be anxiety driven. If patient is still having difficulty after trying that go to ER. Family is agreeable.

## 2020-11-02 DIAGNOSIS — I509 Heart failure, unspecified: Secondary | ICD-10-CM | POA: Diagnosis not present

## 2020-11-02 DIAGNOSIS — R0602 Shortness of breath: Secondary | ICD-10-CM | POA: Diagnosis not present

## 2020-11-11 DIAGNOSIS — R609 Edema, unspecified: Secondary | ICD-10-CM | POA: Diagnosis not present

## 2020-11-11 DIAGNOSIS — R0602 Shortness of breath: Secondary | ICD-10-CM | POA: Diagnosis not present

## 2020-11-11 DIAGNOSIS — I509 Heart failure, unspecified: Secondary | ICD-10-CM | POA: Diagnosis not present

## 2020-11-23 ENCOUNTER — Ambulatory Visit: Payer: Medicare Other | Admitting: Nurse Practitioner

## 2020-11-24 ENCOUNTER — Telehealth: Payer: Medicare Other

## 2020-11-26 ENCOUNTER — Encounter: Payer: Self-pay | Admitting: Nurse Practitioner

## 2020-11-26 ENCOUNTER — Ambulatory Visit (INDEPENDENT_AMBULATORY_CARE_PROVIDER_SITE_OTHER): Payer: Medicare Other | Admitting: Nurse Practitioner

## 2020-11-26 ENCOUNTER — Other Ambulatory Visit: Payer: Self-pay

## 2020-11-26 VITALS — BP 152/92 | HR 103 | Ht <= 58 in | Wt 135.0 lb

## 2020-11-26 DIAGNOSIS — B359 Dermatophytosis, unspecified: Secondary | ICD-10-CM | POA: Insufficient documentation

## 2020-11-26 MED ORDER — BENADRYL ITCH STOPPING 1-0.1 % EX CREA: TOPICAL_CREAM | Freq: Three times a day (TID) | CUTANEOUS | 0 refills | Status: AC | PRN

## 2020-11-26 MED ORDER — TERBINAFINE HCL 1 % EX CREA: 1.0000 "application " | TOPICAL_CREAM | Freq: Two times a day (BID) | CUTANEOUS | 0 refills | Status: AC

## 2020-11-26 NOTE — Progress Notes (Signed)
Acute Office Visit  Subjective:    Patient ID: Paige Ferguson, female    DOB: 07-07-1921, 87 y.o.   MRN: [id number]  Chief Complaint  Patient presents with  . Rash    Bilateral LE. Itching and red. H/O gout    Rash This is a recurrent problem. The current episode started 1 to 4 weeks ago. The problem is unchanged. The affected locations include the right foot and left foot. The rash is characterized by itchiness and redness. She was exposed to nothing. Pertinent negatives include no congestion, cough, fatigue or fever. Past treatments include anti-itch cream (Lotrimin). The treatment provided no relief.     Past Medical History:  Diagnosis Date  . Allergy   . Arthritis of knee   . Cataract   . Cholelithiases   . Chronic kidney disease   . Endometrial polyp 7/98   2 degree polyps   . Genu valgum   . Gout   . Hyperlipidemia   . Hypertension   . Left knee pain   . Osteoarthritis   . Osteoporosis   . Postmenopausal bleeding   . Recurrent UTI   . Renal abscess, right   . Shoulder fracture, left   . TIA (transient ischemic attack)   . Ureteral calculi   . URI (upper respiratory infection)     Past Surgical History:  Procedure Laterality Date  . EYE SURGERY    . HERNIA REPAIR    . HYSTEROSCOPY , FRACTIONAL D&C  7/98/  . [place]  . LEFT CATARACT SURGERY  10/99  . RIGHT CATARACT SURGERY  7/99   Dr. Dolores Lory   . RIGHT HEMINEPHRECTOMY FOR ABSCESS  2/98  . RIGHT NEPHRELITHOTOMY  1982    Family History  Problem Relation Age of Onset  . Stroke Maternal Grandmother   . Hypertension Maternal Grandmother   . Diabetes Maternal Grandmother   . Pneumonia Father   . Coronary artery disease Paternal Grandfather   . Heart defect Son     Social History   Socioeconomic History  . Marital status: Widowed    Spouse name: Not on file  . Number of children: Not on file  . Years of education: Not on file  . Highest education level: Not on file   Occupational History  . Not on file  Tobacco Use  . Smoking status: Former Smoker    Types: Cigarettes    Quit date: 11/20/1944    Years since quitting: 76.0  . Smokeless tobacco: Never Used  Vaping Use  . Vaping Use: Never used  Substance and Sexual Activity  . Alcohol use: No  . Drug use: No  . Sexual activity: Never  Other Topics Concern  . Not on file  Social History Narrative   WIDOWED   3 CHILDREN 1 DECEASED   Social Determinants of Health   Financial Resource Strain: Not on file  Food Insecurity: Not on file  Transportation Needs: Not on file  Physical Activity: Not on file  Stress: Not on file  Social Connections: Not on file  Intimate Partner Violence: Not on file    Outpatient Medications Prior to Visit  Medication Sig Dispense Refill  . amLODipine (NORVASC) 5 MG tablet Take 1 tablet (5 mg total) by mouth daily. 90 tablet 2  . apixaban (ELIQUIS) 5 MG TABS tablet Take 1 tablet (5 mg total) by mouth 2 (two) times daily. 60 tablet 5  . benazepril (LOTENSIN) 40 MG tablet Take 1 tablet (  40 mg total) by mouth daily. 90 tablet 2  . Calcium-Magnesium-Vitamin D (CALCIUM 1200+D3 PO) Take by mouth.    . carvedilol (COREG) 6.25 MG tablet Take 1 tablet (6.25 mg total) by mouth 2 (two) times daily with a meal. 180 tablet 3  . furosemide (LASIX) 40 MG tablet Take 1 tablet (40 mg total) by mouth 2 (two) times daily. (Needs to be seen before next refill) 180 tablet 2  . meloxicam (MOBIC) 7.5 MG tablet Take 1 tablet (7.5 mg total) by mouth daily. 90 tablet 4  . Misc Natural Products (GNP GLUCOSAME CHONDROITIN DS) TABS Take by mouth.    . Multiple Vitamins-Minerals (CENTRUM SILVER ADULT 50+) TABS Take 1 tablet by mouth daily.    . mupirocin ointment (BACTROBAN) 2 % Apply 1 application topically 2 (two) times daily. 22 g 2  . Omega-3 Fatty Acids (FISH OIL) 1000 MG CAPS Take 1 capsule by mouth 2 (two) times daily.    . raloxifene (EVISTA) 60 MG tablet Take 1 tablet (60 mg total) by  mouth daily. 90 tablet 2  . vitamin E 200 UNIT capsule Take 200 Units by mouth daily.     No facility-administered medications prior to visit.    Allergies  Allergen Reactions  . Sulfa Antibiotics Nausea And Vomiting  . Amoxicillin   . Nitrofurantoin Monohyd Macro     Review of Systems  Constitutional: Negative for fatigue and fever.  HENT: Negative for congestion.   Respiratory: Negative for cough.   Skin: Positive for rash.  All other systems reviewed and are negative.      Objective:    Physical Exam Vitals reviewed. Exam conducted with a chaperone present (Grandson).  HENT:     Head: Normocephalic.  Eyes:     Conjunctiva/sclera: Conjunctivae normal.  Cardiovascular:     Rate and Rhythm: Normal rate.     Pulses: Normal pulses.     Heart sounds: Normal heart sounds.  Pulmonary:     Effort: Pulmonary effort is normal.     Breath sounds: Normal breath sounds.  Abdominal:     General: Bowel sounds are normal.  Musculoskeletal:        General: Swelling and tenderness present.     Comments: Bilateral lower extremities  Skin:    Findings: Erythema and rash present.  Neurological:     Mental Status: She is oriented to person, place, and time.  Psychiatric:        Mood and Affect: Mood normal.        Behavior: Behavior normal.     BP (!) 152/92   Pulse (!) 103   Ht 4\' 8"  (1.422 m)   Wt 135 lb (61.2 kg)   SpO2 93%   BMI 30.27 kg/m  Wt Readings from Last 3 Encounters:  11/26/20 135 lb (61.2 kg)  08/30/20 134 lb (60.8 kg)  03/25/20 139 lb (63 kg)    Health Maintenance Due  Topic Date Due  . COVID-19 Vaccine (1) Never done    There are no preventive care reminders to display for this patient.   No results found for: TSH Lab Results  Component Value Date   WBC 6.7 10/19/2020   HGB 11.9 10/19/2020   HCT 34.8 10/19/2020   MCV 98 (H) 10/19/2020   PLT 242 10/19/2020   Lab Results  Component Value Date   NA 144 10/19/2020   K 3.8 10/19/2020   CO2  30 (H) 10/19/2020   GLUCOSE 98 10/19/2020   BUN  29 10/19/2020   CREATININE 0.81 10/19/2020   BILITOT 0.7 10/19/2020   ALKPHOS 70 10/19/2020   AST 22 10/19/2020   ALT 14 10/19/2020   PROT 6.4 10/19/2020   ALBUMIN 3.8 10/19/2020   CALCIUM 9.3 10/19/2020   Lab Results  Component Value Date   CHOL 137 01/01/2019   Lab Results  Component Value Date   HDL 44 01/01/2019   Lab Results  Component Value Date   LDLCALC 81 01/01/2019   Lab Results  Component Value Date   TRIG 58 01/01/2019   Lab Results  Component Value Date   CHOLHDL 3.1 01/01/2019   No results found for: HGBA1C     Assessment & Plan:   Problem List Items Addressed This Visit      Musculoskeletal and Integument   Ringworm - Primary    Patient is having recurrent episodes of ringworm that is not well controlled on Lotrimin and antiitch creams.  Patient is reporting worsening rash bilateral lower extremity.  On assessment patient has scattered rash all across right and left foot.  Erythema present, feet is cool to touch, patient denies any fever or pain. patient has a history of gout but reports this is not related to gout flareups. Advised patient to keep skin dry and clean.  Started patient on Lamisil, and antiitch cream.   Follow-up for worsening unresolved symptoms.   Rx sent to pharmacy.      Relevant Medications   terbinafine (LAMISIL AT) 1 % cream   diphenhydrAMINE-zinc acetate (BENADRYL ITCH STOPPING) cream       Meds ordered this encounter  Medications  . terbinafine (LAMISIL AT) 1 % cream    Sig: Apply 1 application topically 2 (two) times daily.    Dispense:  30 g    Refill:  0    Order Specific Question:   Supervising Provider    Answer:   Janora Norlander [[id number]]  . diphenhydrAMINE-zinc acetate (BENADRYL ITCH STOPPING) cream    Sig: Apply topically 3 (three) times daily as needed for itching.    Dispense:  28.3 g    Refill:  0    Order Specific Question:   Supervising  Provider    Answer:   Janora Norlander [[id number]]     Ivy Lynn, NP

## 2020-11-26 NOTE — Assessment & Plan Note (Signed)
Patient is having recurrent episodes of ringworm that is not well controlled on Lotrimin and antiitch creams.  Patient is reporting worsening rash bilateral lower extremity.  On assessment patient has scattered rash all across right and left foot.  Erythema present, feet is cool to touch, patient denies any fever or pain. patient has a history of gout but reports this is not related to gout flareups. Advised patient to keep skin dry and clean.  Started patient on Lamisil, and antiitch cream.   Follow-up for worsening unresolved symptoms.   Rx sent to pharmacy.

## 2020-11-26 NOTE — Patient Instructions (Signed)
Rash, Adult  A rash is a change in the color of your skin. A rash can also change the way your skin feels. There are many different conditions and factors that can cause a rash. Follow these instructions at home: The goal of treatment is to stop the itching and keep the rash from spreading. Watch for any changes in your symptoms. Let your doctor know about them. Follow these instructions to help with your condition: Medicine Take or apply over-the-counter and prescription medicines only as told by your doctor. These may include medicines:  To treat red or swollen skin (corticosteroid creams).  To treat itching.  To treat an allergy (oral antihistamines).  To treat very bad symptoms (oral corticosteroids).  Skin care  Put cool cloths (compresses) on the affected areas.  Do not scratch or rub your skin.  Avoid covering the rash. Make sure that the rash is exposed to air as much as possible. Managing itching and discomfort  Avoid hot showers or baths. These can make itching worse. A cold shower may help.  Try taking a bath with: ? Epsom salts. You can get these at your local pharmacy or grocery store. Follow the instructions on the package. ? Baking soda. Pour a small amount into the bath as told by your doctor. ? Colloidal oatmeal. You can get this at your local pharmacy or grocery store. Follow the instructions on the package.  Try putting baking soda paste onto your skin. Stir water into baking soda until it gets like a paste.  Try putting on a lotion that relieves itchiness (calamine lotion).  Keep cool and out of the sun. Sweating and being hot can make itching worse. General instructions   Rest as needed.  Drink enough fluid to keep your pee (urine) pale yellow.  Wear loose-fitting clothing.  Avoid scented soaps, detergents, and perfumes. Use gentle soaps, detergents, perfumes, and other cosmetic products.  Avoid anything that causes your rash. Keep a journal to  help track what causes your rash. Write down: ? What you eat. ? What cosmetic products you use. ? What you drink. ? What you wear. This includes jewelry.  Keep all follow-up visits as told by your doctor. This is important. Contact a doctor if:  You sweat at night.  You lose weight.  You pee (urinate) more than normal.  You pee less than normal, or you notice that your pee is a darker color than normal.  You feel weak.  You throw up (vomit).  Your skin or the whites of your eyes look yellow (jaundice).  Your skin: ? Tingles. ? Is numb.  Your rash: ? Does not go away after a few days. ? Gets worse.  You are: ? More thirsty than normal. ? More tired than normal.  You have: ? New symptoms. ? Pain in your belly (abdomen). ? A fever. ? Watery poop (diarrhea). Get help right away if:  You have a fever and your symptoms suddenly get worse.  You start to feel mixed up (confused).  You have a very bad headache or a stiff neck.  You have very bad joint pains or stiffness.  You have jerky movements that you cannot control (seizure).  Your rash covers all or most of your body. The rash may or may not be painful.  You have blisters that: ? Are on top of the rash. ? Grow larger. ? Grow together. ? Are painful. ? Are inside your nose or mouth.  You have a rash   that: ? Looks like purple pinprick-sized spots all over your body. ? Has a "bull's eye" or looks like a target. ? Is red and painful, causes your skin to peel, and is not from being in the sun too long. Summary  A rash is a change in the color of your skin. A rash can also change the way your skin feels.  The goal of treatment is to stop the itching and keep the rash from spreading.  Take or apply over-the-counter and prescription medicines only as told by your doctor.  Contact a doctor if you have new symptoms or symptoms that get worse.  Keep all follow-up visits as told by your doctor. This is  important. This information is not intended to replace advice given to you by your health care provider. Make sure you discuss any questions you have with your health care provider. Document Revised: 02/28/2019 Document Reviewed: 06/10/2018 Elsevier Patient Education  2020 [employer]  

## 2020-11-30 ENCOUNTER — Telehealth: Payer: Self-pay

## 2020-12-03 ENCOUNTER — Emergency Department (HOSPITAL_COMMUNITY)
Admission: EM | Admit: 2020-12-03 | Discharge: 2020-12-03 | Disposition: A | Discharge status: Home / Self Care | Payer: Medicare Other | Attending: Emergency Medicine | Admitting: Emergency Medicine

## 2020-12-03 ENCOUNTER — Encounter (HOSPITAL_COMMUNITY): Payer: Self-pay

## 2020-12-03 ENCOUNTER — Emergency Department (HOSPITAL_COMMUNITY): Payer: Medicare Other

## 2020-12-03 ENCOUNTER — Other Ambulatory Visit: Payer: Self-pay

## 2020-12-03 DIAGNOSIS — Z79899 Other long term (current) drug therapy: Secondary | ICD-10-CM | POA: Insufficient documentation

## 2020-12-03 DIAGNOSIS — N189 Chronic kidney disease, unspecified: Secondary | ICD-10-CM | POA: Insufficient documentation

## 2020-12-03 DIAGNOSIS — I4891 Unspecified atrial fibrillation: Secondary | ICD-10-CM | POA: Diagnosis not present

## 2020-12-03 DIAGNOSIS — J90 Pleural effusion, not elsewhere classified: Secondary | ICD-10-CM | POA: Diagnosis not present

## 2020-12-03 DIAGNOSIS — K209 Esophagitis, unspecified without bleeding: Secondary | ICD-10-CM | POA: Diagnosis not present

## 2020-12-03 DIAGNOSIS — Z743 Need for continuous supervision: Secondary | ICD-10-CM | POA: Diagnosis not present

## 2020-12-03 DIAGNOSIS — I499 Cardiac arrhythmia, unspecified: Secondary | ICD-10-CM | POA: Diagnosis not present

## 2020-12-03 DIAGNOSIS — I130 Hypertensive heart and chronic kidney disease with heart failure and stage 1 through stage 4 chronic kidney disease, or unspecified chronic kidney disease: Secondary | ICD-10-CM | POA: Diagnosis not present

## 2020-12-03 DIAGNOSIS — R1013 Epigastric pain: Secondary | ICD-10-CM | POA: Diagnosis present

## 2020-12-03 DIAGNOSIS — J9811 Atelectasis: Secondary | ICD-10-CM | POA: Diagnosis not present

## 2020-12-03 DIAGNOSIS — R6889 Other general symptoms and signs: Secondary | ICD-10-CM | POA: Diagnosis not present

## 2020-12-03 DIAGNOSIS — Z87891 Personal history of nicotine dependence: Secondary | ICD-10-CM | POA: Insufficient documentation

## 2020-12-03 DIAGNOSIS — R000 Tachycardia, unspecified: Secondary | ICD-10-CM | POA: Diagnosis not present

## 2020-12-03 DIAGNOSIS — Z7901 Long term (current) use of anticoagulants: Secondary | ICD-10-CM | POA: Insufficient documentation

## 2020-12-03 DIAGNOSIS — R531 Weakness: Secondary | ICD-10-CM | POA: Diagnosis not present

## 2020-12-03 DIAGNOSIS — R0602 Shortness of breath: Secondary | ICD-10-CM | POA: Diagnosis not present

## 2020-12-03 DIAGNOSIS — R109 Unspecified abdominal pain: Secondary | ICD-10-CM | POA: Diagnosis not present

## 2020-12-03 DIAGNOSIS — I509 Heart failure, unspecified: Secondary | ICD-10-CM | POA: Diagnosis not present

## 2020-12-03 LAB — CBC WITH DIFFERENTIAL/PLATELET
Abs Immature Granulocytes: 0.02 10*3/uL (ref 0.00–0.07)
Basophils Absolute: 0 10*3/uL (ref 0.0–0.1)
Basophils Relative: 0 %
Eosinophils Absolute: 0.1 10*3/uL (ref 0.0–0.5)
Eosinophils Relative: 1 %
HCT: 41.2 % (ref 36.0–46.0)
Hemoglobin: 13 g/dL (ref 12.0–15.0)
Immature Granulocytes: 0 %
Lymphocytes Relative: 12 %
Lymphs Abs: 1.6 10*3/uL (ref 0.7–4.0)
MCH: 32.4 pg (ref 26.0–34.0)
MCHC: 31.6 g/dL (ref 30.0–36.0)
MCV: 102.7 fL — ABNORMAL HIGH (ref 80.0–100.0)
Monocytes Absolute: 1.9 10*3/uL — ABNORMAL HIGH (ref 0.1–1.0)
Monocytes Relative: 15 %
Neutro Abs: 9.5 10*3/uL — ABNORMAL HIGH (ref 1.7–7.7)
Neutrophils Relative %: 72 %
Platelets: 252 10*3/uL (ref 150–400)
RBC: 4.01 MIL/uL (ref 3.87–5.11)
RDW: 14.1 % (ref 11.5–15.5)
WBC: 13.1 10*3/uL — ABNORMAL HIGH (ref 4.0–10.5)
nRBC: 0 % (ref 0.0–0.2)

## 2020-12-03 LAB — LIPASE, BLOOD: Lipase: 43 U/L (ref 11–51)

## 2020-12-03 LAB — BASIC METABOLIC PANEL
Anion gap: 11 (ref 5–15)
BUN: 38 mg/dL — ABNORMAL HIGH (ref 8–23)
CO2: 31 mmol/L (ref 22–32)
Calcium: 9.2 mg/dL (ref 8.9–10.3)
Chloride: 103 mmol/L (ref 98–111)
Creatinine, Ser: 1.1 mg/dL — ABNORMAL HIGH (ref 0.44–1.00)
GFR, Estimated: 46 mL/min — ABNORMAL LOW (ref >60)
Glucose, Bld: 137 mg/dL — ABNORMAL HIGH (ref 70–99)
Potassium: 3.6 mmol/L (ref 3.5–5.1)
Sodium: 145 mmol/L (ref 135–145)

## 2020-12-03 LAB — HEPATIC FUNCTION PANEL
ALT: 16 U/L (ref 0–44)
AST: 25 U/L (ref 15–41)
Albumin: 3.9 g/dL (ref 3.5–5.0)
Alkaline Phosphatase: 56 U/L (ref 38–126)
Bilirubin, Direct: 0.1 mg/dL (ref 0.0–0.2)
Indirect Bilirubin: 0.8 mg/dL (ref 0.3–0.9)
Total Bilirubin: 0.9 mg/dL (ref 0.3–1.2)
Total Protein: 7.3 g/dL (ref 6.5–8.1)

## 2020-12-03 LAB — TROPONIN I (HIGH SENSITIVITY)
Troponin I (High Sensitivity): 12 ng/L (ref <18)
Troponin I (High Sensitivity): 29 ng/L — ABNORMAL HIGH (ref <18)

## 2020-12-03 IMAGING — DX DG CHEST 1V PORT
1 series · 1 of 1 positions shown · non-contrast
Comparison: [DATE]

CLINICAL DATA: Weakness

EXAM:
PORTABLE CHEST 1 VIEW

[chest ap]
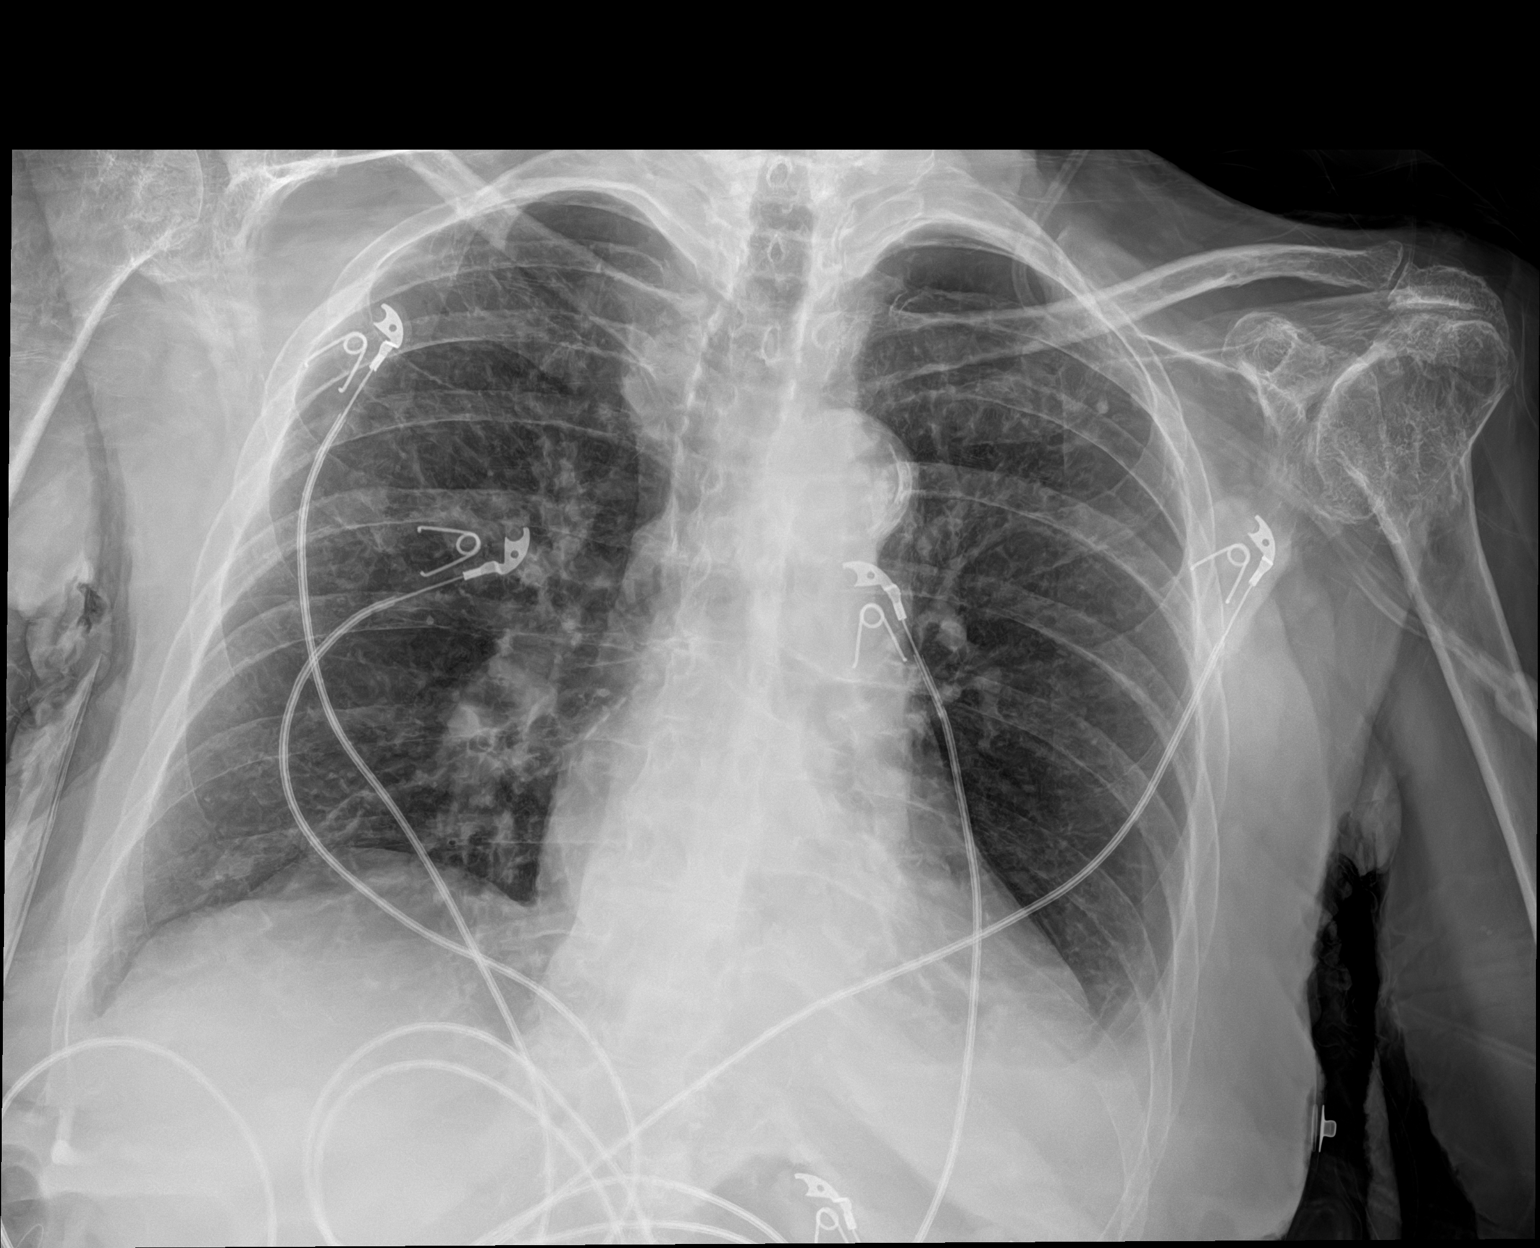

[1 of 1 positions shown; findings below may reference images not displayed]

FINDINGS: Persistent or recurrent small left pleural effusion and left basilar
atelectasis. Chronic interstitial prominence. Stable
cardiomediastinal contours.
IMPRESSION: Persistent or recurrent small left pleural effusion and left basilar
atelectasis.

## 2020-12-03 IMAGING — CT CT ABD-PELV W/ CM
2 of 5 series · 15 of 46 positions shown, 17 images · IV contrast (Omnipaque or Isovue)
Comparison: Report only from remote CT [DATE].

CLINICAL DATA: Acute nonlocalized abdominal pain today. History of
ureteral calculi, chronic kidney disease, cholelithiasis and hernia
repair.

EXAM:
CT ABDOMEN AND PELVIS WITH CONTRAST
TECHNIQUE: Multidetector CT imaging of the abdomen and pelvis was performed
using the standard protocol following bolus administration of
intravenous contrast.
CONTRAST:  80mL OMNIPAQUE IOHEXOL 300 MG/ML  SOLN

[Series 2: axial st · axial · 0.72mm/px · z∈[+976,+1316]mm · 12 of 78 slices shown, 14 images]
[im 5/78  soft-tissue]
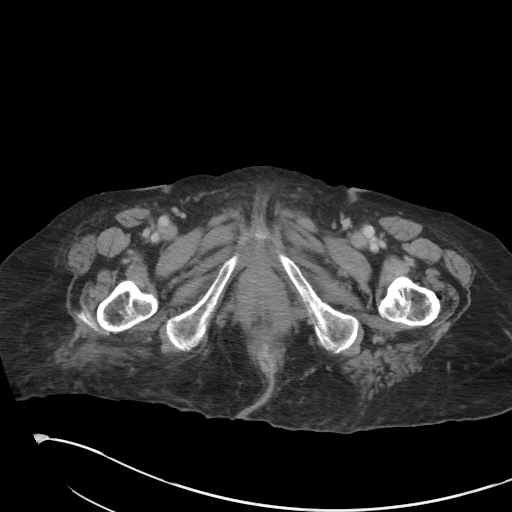
[im 5/78  bone]
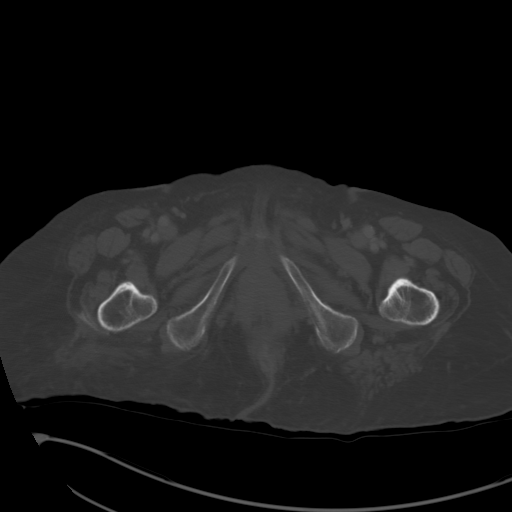
[im 14/78  soft-tissue]
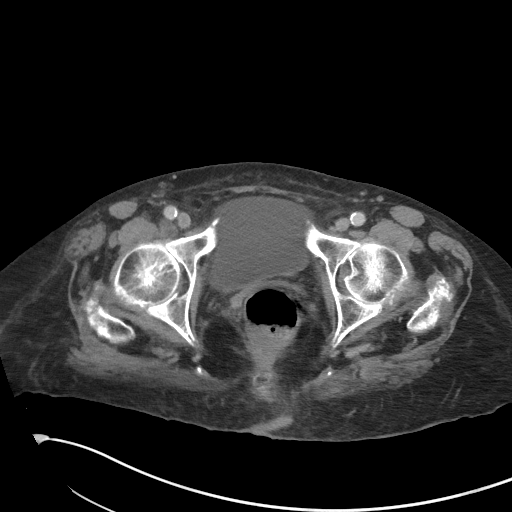
[im 19/78  soft-tissue]
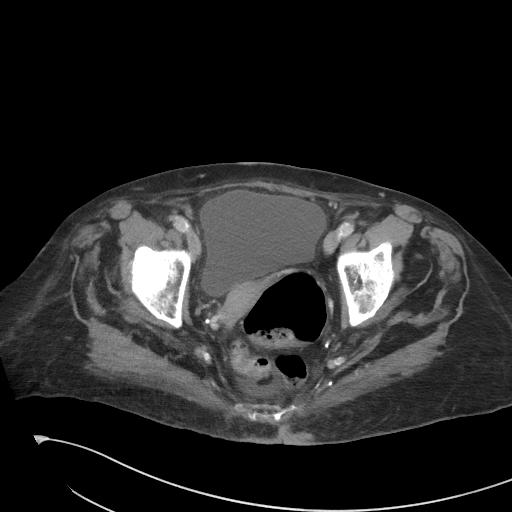
[im 23/78  soft-tissue]
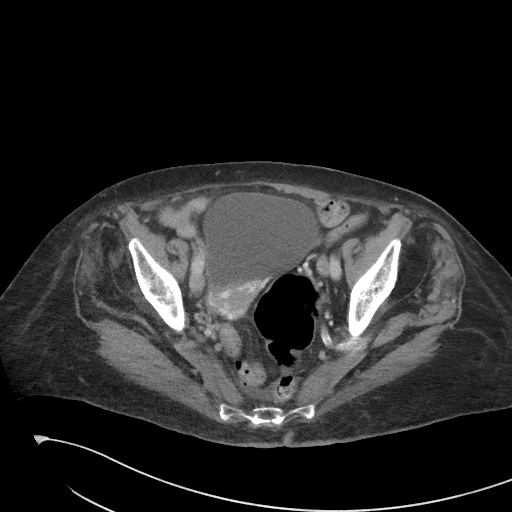
[im 32/78  soft-tissue]
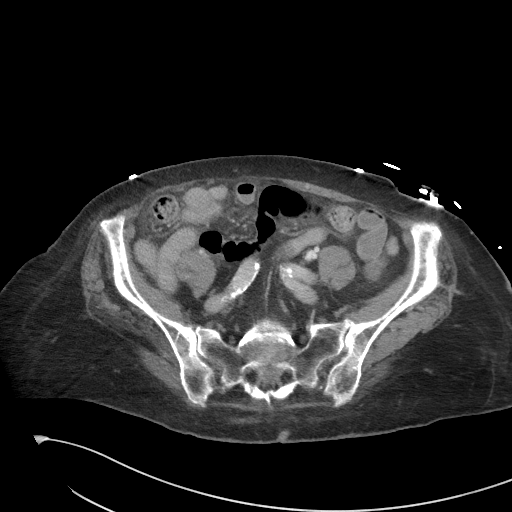
[im 37/78  soft-tissue]
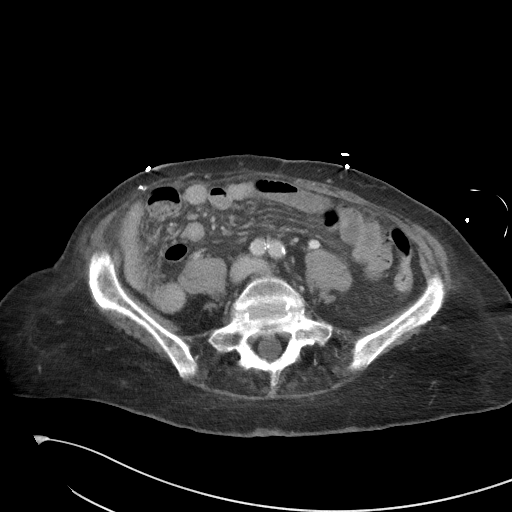
[im 41/78  soft-tissue]
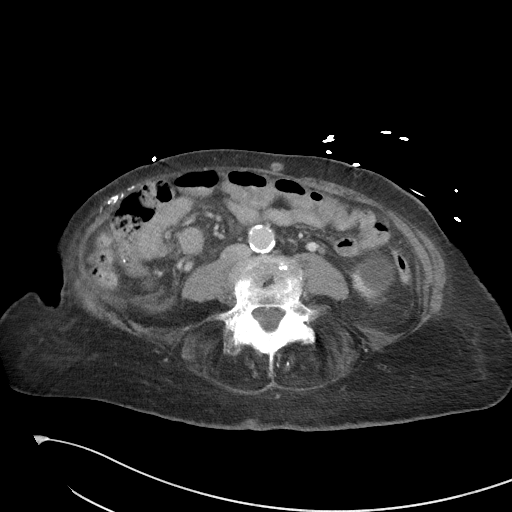
[im 50/78  soft-tissue]
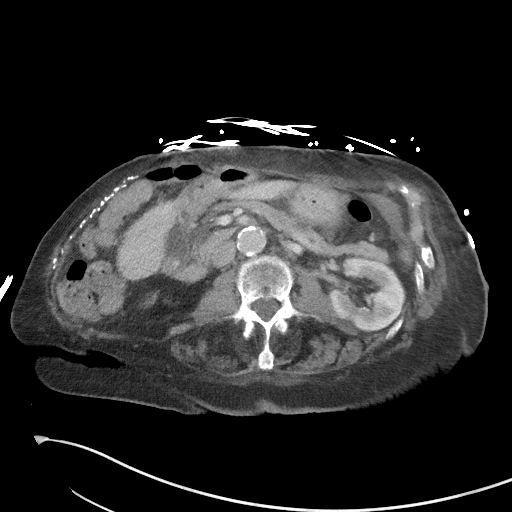
[im 55/78  soft-tissue]
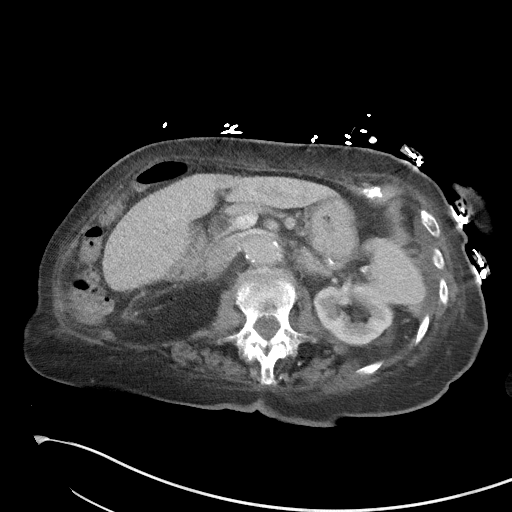
[im 55/78  bone]
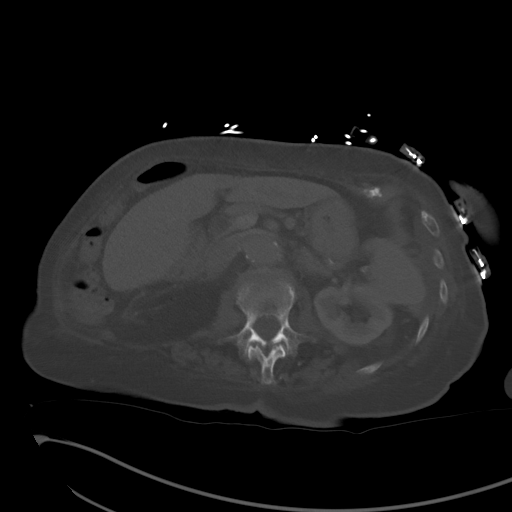
[im 59/78  soft-tissue]
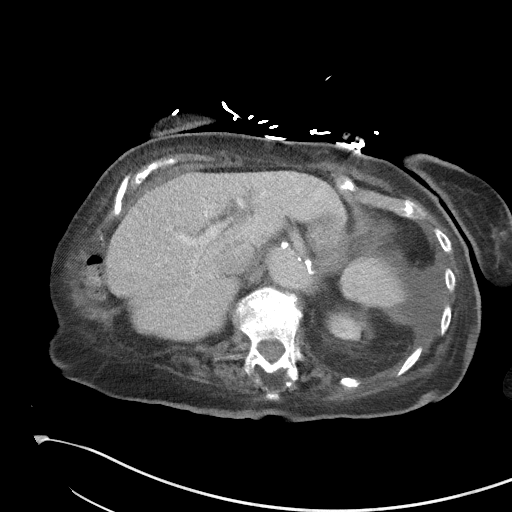
[im 68/78  soft-tissue]
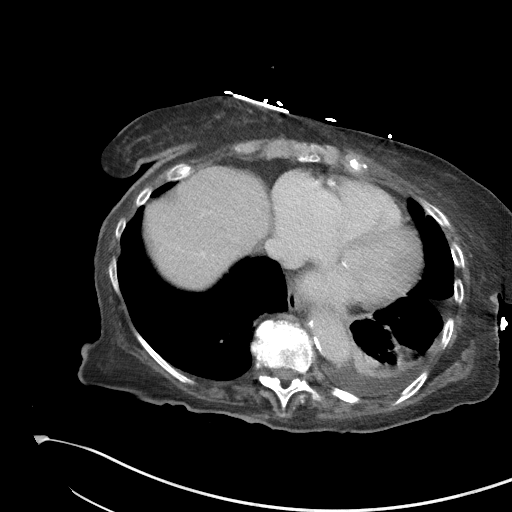
[im 73/78  soft-tissue]
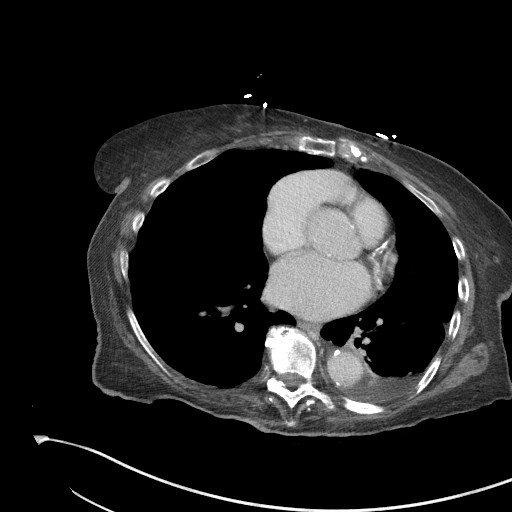

[Series 5: coronal st · coronal · 0.72mm/px · 3 of 85 slices shown]
[im 29/85  soft-tissue]
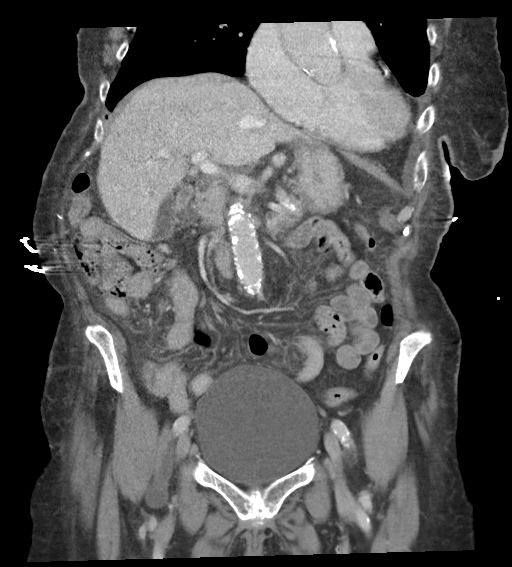
[im 38/85  soft-tissue]
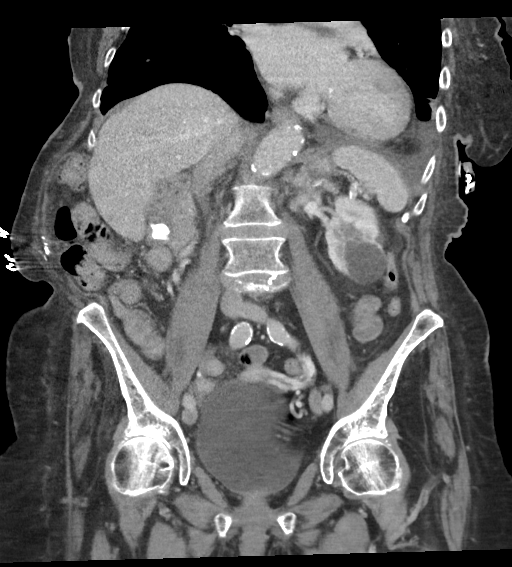
[im 47/85  soft-tissue]
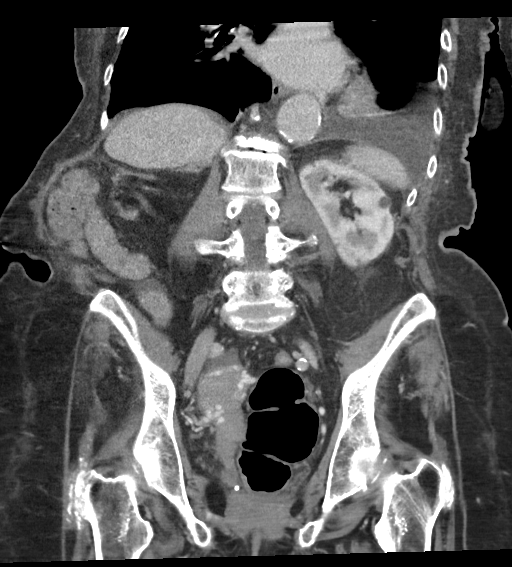

[15 of 46 positions shown; findings below may reference images not displayed]

Images from
outside renal ultrasound [DATE]. Chest radiographs [DATE]
and [DATE].
FINDINGS: Lower chest: A small dependent left pleural effusion and associated
left basilar opacity appears similar to recent radiographs. There is
a 9 x 8 mm nodule medially in the lingula (image [DATE]). There is mild
bronchiectasis and central airway thickening in the right middle
lobe. Mild clustered nodularity is present in the right lower lobe
(image [DATE]). There is atherosclerosis of the aorta and coronary
arteries.

Hepatobiliary: The liver is normal in density without suspicious
focal abnormality. Dependent calcified gallstone. No gallbladder
wall thickening or significant biliary dilatation.

Pancreas: 13 mm cystic lesion in the pancreatic tail on image [DATE]
demonstrates no aggressive characteristics. There is no pancreatic
ductal dilatation or surrounding inflammatory change.

Spleen: Normal in size without focal abnormality.

Adrenals/Urinary Tract: Per previous report, the patient is status
post right nephrectomy. The right kidney is either absent or
markedly atrophied (image [DATE]). The left kidney demonstrates small
cortical and parapelvic cysts. There are nonobstructing left renal
calculi. No evidence of ureteral calculus or hydronephrosis. The
left adrenal gland appears normal. The right adrenal gland is
questionably present, without abnormality. The bladder appears
unremarkable.

Stomach/Bowel: The stomach appears unremarkable for its degree of
distension. No evidence of bowel wall thickening, distention or
surrounding inflammatory change.

Vascular/Lymphatic: There are no enlarged abdominal or pelvic lymph
nodes. Moderate aortic and branch vessel atherosclerosis without
acute vascular findings. The portal, superior mesenteric and splenic
veins are patent.

Reproductive: The uterus and ovaries appear normal. No adnexal mass.

Other: Postsurgical changes in the right lateral abdominal wall
consistent with hernia repair. No recurrent hernia identified. Trace
pelvic ascites and mesenteric edema without focal extraluminal fluid
collection. No free intraperitoneal air.

Musculoskeletal: No acute or significant osseous findings.
Multilevel lumbar spondylosis post laminectomy at L5-S1.
IMPRESSION: 1. No acute findings or explanation for the patient's symptoms.
2. Nonobstructing left renal calculi. No evidence of ureteral
calculus or hydronephrosis. Suspected severe chronic right renal
atrophy.
3. Cholelithiasis without evidence of cholecystitis or biliary
dilatation.
4. Small pancreatic cystic lesion, likely postinflammatory or
indolent cystic neoplasm, unlikely to be significant given the
patient's age.
5. Small dependent left pleural effusion and associated left basilar
opacity, similar to recent radiographs. Mild clustered nodularity in
the right lower lobe and bronchiectasis in the right middle lobe,
likely inflammatory. Indeterminate 9 x 8 mm nodule medially in the
lingula. Suggest follow-up chest CT in 6-12 months.
6. Aortic Atherosclerosis ([KX]-[KX]).

## 2020-12-03 MED ORDER — METOPROLOL TARTRATE 5 MG/5ML IV SOLN
2.5000 mg | Freq: Once | INTRAVENOUS | Status: AC
  Administered 2020-12-03: 2.5 mg via INTRAVENOUS
  Filled 2020-12-03: qty 5

## 2020-12-03 MED ORDER — IOHEXOL 300 MG/ML  SOLN
80.0000 mL | Freq: Once | INTRAMUSCULAR | Status: AC | PRN
  Administered 2020-12-03: 80 mL via INTRAVENOUS

## 2020-12-03 MED ORDER — PANTOPRAZOLE SODIUM 40 MG IV SOLR
40.0000 mg | Freq: Once | INTRAVENOUS | Status: AC
  Administered 2020-12-03: 40 mg via INTRAVENOUS
  Filled 2020-12-03: qty 40

## 2020-12-03 MED ORDER — PANTOPRAZOLE SODIUM 20 MG PO TBEC: 20.0000 mg | DELAYED_RELEASE_TABLET | Freq: Every day | ORAL | 0 refills | Status: DC

## 2020-12-03 NOTE — ED Triage Notes (Signed)
[facility] from home with cc of abdominal pain. Woke her up this morning, family gave pepto, gave soft stool 10/10 pain A fib rvr on ems monitor hx of same

## 2020-12-03 NOTE — Discharge Instructions (Addendum)
Follow-up with your family doctor next week.  Also the cardiologist will make an appointment that he can see them

## 2020-12-03 NOTE — ED Provider Notes (Signed)
[facility] EMERGENCY DEPARTMENT Provider Note   CSN: [id number] Arrival date & time: 12/03/20  [id number]     History Chief Complaint  Patient presents with  . Abdominal Pain    Paige Ferguson is a 87 y.o. female.  Patient with epigastric pain that has improved.  No shortness of breath no swelling.  The history is provided by the patient and medical records. No language interpreter was used.  Abdominal Pain Pain location:  Epigastric Pain quality: aching   Pain radiates to:  Does not radiate Pain severity:  Mild Onset quality:  Sudden Timing:  Constant Progression:  Waxing and waning Chronicity:  New Context: not alcohol use and not awakening from sleep   Associated symptoms: no chest pain, no cough, no diarrhea, no fatigue and no hematuria        Past Medical History:  Diagnosis Date  . Allergy   . Arthritis of knee   . Cataract   . Cholelithiases   . Chronic kidney disease   . Endometrial polyp 7/98   2 degree polyps   . Genu valgum   . Gout   . Hyperlipidemia   . Hypertension   . Left knee pain   . Osteoarthritis   . Osteoporosis   . Postmenopausal bleeding   . Recurrent UTI   . Renal abscess, right   . Shoulder fracture, left   . TIA (transient ischemic attack)   . Ureteral calculi   . URI (upper respiratory infection)     Patient Active Problem List   Diagnosis Date Noted  . Ringworm 11/26/2020  . On home oxygen therapy 09/19/2019  . Atrial fibrillation ([place]) 06/26/2019  . CHF (congestive heart failure) ([place]) 06/26/2019  . Pressure injury of toe of left foot, stage 1 04/02/2018  . Obesity (BMI 30-39.9) 01/12/2017  . Metabolic syndrome [id number]  . Fissure in ano 03/09/2014  . Hemorrhoid 03/09/2014  . TIA (transient ischemic attack) 12/23/2013  . Hyperlipidemia   . Renal abscess, right   . Osteoarthritis   . Postmenopausal bleeding   . Ureteral calculi   . Recurrent UTI   . Single kidney 06/05/2013  . HTN (hypertension) 02/28/2013  . Gout  02/28/2013  . Osteoporosis 02/28/2013  . Arthritis of knee, left 02/28/2013  . Genu valgum (acquired) 02/28/2013  . Endometrial polyp 05/20/1997    Past Surgical History:  Procedure Laterality Date  . EYE SURGERY    . HERNIA REPAIR    . HYSTEROSCOPY , FRACTIONAL D&C  7/98/  . [place]  . LEFT CATARACT SURGERY  10/99  . RIGHT CATARACT SURGERY  7/99   Dr. Dolores Lory   . RIGHT HEMINEPHRECTOMY FOR ABSCESS  2/98  . RIGHT NEPHRELITHOTOMY  1982     OB History   No obstetric history on file.     Family History  Problem Relation Age of Onset  . Stroke Maternal Grandmother   . Hypertension Maternal Grandmother   . Diabetes Maternal Grandmother   . Pneumonia Father   . Coronary artery disease Paternal Grandfather   . Heart defect Son     Social History   Tobacco Use  . Smoking status: Former Smoker    Types: Cigarettes    Quit date: 11/20/1944    Years since quitting: 76.0  . Smokeless tobacco: Never Used  Vaping Use  . Vaping Use: Never used  Substance Use Topics  . Alcohol use: No  . Drug use: No    Home Medications Prior  to Admission medications   Medication Sig Start Date End Date Taking? Authorizing Provider  amLODipine (NORVASC) 5 MG tablet Take 1 tablet (5 mg total) by mouth daily. 10/19/20  Yes Hawks, Christy A, FNP  apixaban (ELIQUIS) 5 MG TABS tablet Take 1 tablet (5 mg total) by mouth 2 (two) times daily. 10/19/20  Yes Hawks, Christy A, FNP  benazepril (LOTENSIN) 40 MG tablet Take 1 tablet (40 mg total) by mouth daily. 10/19/20  Yes Hawks, Christy A, FNP  Calcium-Magnesium-Vitamin D (CALCIUM 1200+D3 PO) Take by mouth.   Yes [provider]  carvedilol (COREG) 6.25 MG tablet Take 1 tablet (6.25 mg total) by mouth 2 (two) times daily with a meal. 10/19/20  Yes Hawks, Christy A, FNP  diphenhydrAMINE-zinc acetate (BENADRYL ITCH STOPPING) cream Apply topically 3 (three) times daily as needed for itching. 11/26/20  Yes Ivy Lynn, NP   furosemide (LASIX) 40 MG tablet Take 1 tablet (40 mg total) by mouth 2 (two) times daily. (Needs to be seen before next refill) 10/19/20  Yes Hawks, Christy A, FNP  meloxicam (MOBIC) 7.5 MG tablet Take 1 tablet (7.5 mg total) by mouth daily. 10/19/20  Yes Hawks, Theador Hawthorne, FNP  Misc Natural Products (GNP GLUCOSAME CHONDROITIN DS) TABS Take by mouth.   Yes [provider]  Multiple Vitamins-Minerals (CENTRUM SILVER ADULT 50+) TABS Take 1 tablet by mouth daily.   Yes [provider]  mupirocin ointment (BACTROBAN) 2 % Apply 1 application topically 2 (two) times daily. 08/23/20  Yes Lavonna Monarch, MD  Omega-3 Fatty Acids (FISH OIL) 1000 MG CAPS Take 1 capsule by mouth 2 (two) times daily.   Yes [provider]  pantoprazole (PROTONIX) 20 MG tablet Take 1 tablet (20 mg total) by mouth daily. 12/03/20  Yes Paige Ferguson, MD  raloxifene (EVISTA) 60 MG tablet Take 1 tablet (60 mg total) by mouth daily. 10/19/20  Yes Hawks, Christy A, FNP  terbinafine (LAMISIL AT) 1 % cream Apply 1 application topically 2 (two) times daily. 11/26/20  Yes Ivy Lynn, NP  vitamin E 200 UNIT capsule Take 200 Units by mouth daily.   Yes [provider]    Allergies    Sulfa antibiotics, Amoxicillin, and Nitrofurantoin monohyd macro  Review of Systems   Review of Systems  Constitutional: Negative for appetite change and fatigue.  HENT: Negative for congestion, ear discharge and sinus pressure.   Eyes: Negative for discharge.  Respiratory: Negative for cough.   Cardiovascular: Negative for chest pain.  Gastrointestinal: Positive for abdominal pain. Negative for diarrhea.  Genitourinary: Negative for frequency and hematuria.  Musculoskeletal: Negative for back pain.  Skin: Negative for rash.  Neurological: Negative for seizures and headaches.  Psychiatric/Behavioral: Negative for hallucinations.    Physical Exam Updated Vital Signs BP 125/75   Pulse 97   Temp 98 F (36.7  C) (Oral)   Resp 18   Ht 4\' 8"  (1.422 m)   Wt 61.2 kg   SpO2 97%   BMI 30.27 kg/m   Physical Exam Vitals and nursing note reviewed.  Constitutional:      Appearance: She is well-developed.  HENT:     Head: Normocephalic.     Nose: Nose normal.     Mouth/Throat:     Mouth: Mucous membranes are moist.  Eyes:     General: No scleral icterus.    Extraocular Movements: EOM normal.     Conjunctiva/sclera: Conjunctivae normal.  Neck:     Thyroid: No thyromegaly.  Cardiovascular:     Rate and Rhythm: Normal rate and regular rhythm.     Heart sounds: No murmur heard. No friction rub. No gallop.   Pulmonary:     Breath sounds: No stridor. No wheezing or rales.  Chest:     Chest wall: No tenderness.  Abdominal:     General: There is no distension.     Tenderness: There is no abdominal tenderness. There is no rebound.     Comments: Mild tenderness epigastric  Musculoskeletal:        General: No edema. Normal range of motion.     Cervical back: Neck supple.  Lymphadenopathy:     Cervical: No cervical adenopathy.  Skin:    Findings: No erythema or rash.  Neurological:     Mental Status: She is alert and oriented to person, place, and time.     Motor: No abnormal muscle tone.     Coordination: Coordination normal.  Psychiatric:        Mood and Affect: Mood and affect normal.        Behavior: Behavior normal.     ED Results / Procedures / Treatments   Labs (all labs ordered are listed, but only abnormal results are displayed) Labs Reviewed  CBC WITH DIFFERENTIAL/PLATELET - Abnormal; Notable for the following components:      Result Value   WBC 13.1 (*)    MCV 102.7 (*)    Neutro Abs 9.5 (*)    Monocytes Absolute 1.9 (*)    All other components within normal limits  BASIC METABOLIC PANEL - Abnormal; Notable for the following components:   Glucose, Bld 137 (*)    BUN 38 (*)    Creatinine, Ser 1.10 (*)    GFR, Estimated 46 (*)    All other components within normal  limits  TROPONIN I (HIGH SENSITIVITY) - Abnormal; Notable for the following components:   Troponin I (High Sensitivity) 29 (*)    All other components within normal limits  LIPASE, BLOOD  HEPATIC FUNCTION PANEL  TROPONIN I (HIGH SENSITIVITY)    EKG EKG Interpretation  Date/Time:  Friday December 03 2020 08:16:48 EST Ventricular Rate:  118 PR Interval:    QRS Duration: 80 QT Interval:  345 QTC Calculation: 484 R Axis:   87 Text Interpretation: Sinus tachycardia with irregular rate Borderline right axis deviation Borderline T abnormalities, anterior leads Confirmed by Paige Ferguson [phone]) on 12/03/2020 8:54:23 AM   Radiology CT ABDOMEN PELVIS W CONTRAST  Result Date: 12/03/2020 CLINICAL DATA:  Acute nonlocalized abdominal pain today. History of ureteral calculi, chronic kidney disease, cholelithiasis and hernia repair. EXAM: CT ABDOMEN AND PELVIS WITH CONTRAST TECHNIQUE: Multidetector CT imaging of the abdomen and pelvis was performed using the standard protocol following bolus administration of intravenous contrast. CONTRAST:  83mL OMNIPAQUE IOHEXOL 300 MG/ML  SOLN COMPARISON:  Report only from remote CT 12/25/2001. Images from outside renal ultrasound 06/02/2014. Chest radiographs 01/01/2019 and 06/26/2019. FINDINGS: Lower chest: A small dependent left pleural effusion and associated left basilar opacity appears similar to recent radiographs. There is a 9 x 8 mm nodule medially in the lingula (image 2/4). There is mild bronchiectasis and central airway thickening in the right middle lobe. Mild clustered nodularity is present in the right lower lobe (image 6/4). There is atherosclerosis of the aorta and coronary arteries. Hepatobiliary: The liver is normal in density without suspicious focal abnormality. Dependent calcified gallstone. No gallbladder wall thickening or significant biliary dilatation. Pancreas: 13 mm cystic lesion  in the pancreatic tail on image 26/2 demonstrates no aggressive  characteristics. There is no pancreatic ductal dilatation or surrounding inflammatory change. Spleen: Normal in size without focal abnormality. Adrenals/Urinary Tract: Per previous report, the patient is status post right nephrectomy. The right kidney is either absent or markedly atrophied (image 25/2). The left kidney demonstrates small cortical and parapelvic cysts. There are nonobstructing left renal calculi. No evidence of ureteral calculus or hydronephrosis. The left adrenal gland appears normal. The right adrenal gland is questionably present, without abnormality. The bladder appears unremarkable. Stomach/Bowel: The stomach appears unremarkable for its degree of distension. No evidence of bowel wall thickening, distention or surrounding inflammatory change. Vascular/Lymphatic: There are no enlarged abdominal or pelvic lymph nodes. Moderate aortic and branch vessel atherosclerosis without acute vascular findings. The portal, superior mesenteric and splenic veins are patent. Reproductive: The uterus and ovaries appear normal. No adnexal mass. Other: Postsurgical changes in the right lateral abdominal wall consistent with hernia repair. No recurrent hernia identified. Trace pelvic ascites and mesenteric edema without focal extraluminal fluid collection. No free intraperitoneal air. Musculoskeletal: No acute or significant osseous findings. Multilevel lumbar spondylosis post laminectomy at L5-S1. IMPRESSION: 1. No acute findings or explanation for the patient's symptoms. 2. Nonobstructing left renal calculi. No evidence of ureteral calculus or hydronephrosis. Suspected severe chronic right renal atrophy. 3. Cholelithiasis without evidence of cholecystitis or biliary dilatation. 4. Small pancreatic cystic lesion, likely postinflammatory or indolent cystic neoplasm, unlikely to be significant given the patient's age. 5. Small dependent left pleural effusion and associated left basilar opacity, similar to recent  radiographs. Mild clustered nodularity in the right lower lobe and bronchiectasis in the right middle lobe, likely inflammatory. Indeterminate 9 x 8 mm nodule medially in the lingula. Suggest follow-up chest CT in 6-12 months. 6. Aortic Atherosclerosis (ICD10-I70.0). Electronically Signed   By: Richardean Sale M.D.   On: 12/03/2020 12:38   DG Chest Port 1 View  Result Date: 12/03/2020 CLINICAL DATA:  Weakness EXAM: PORTABLE CHEST 1 VIEW COMPARISON:  06/26/2019 FINDINGS: Persistent or recurrent small left pleural effusion and left basilar atelectasis. Chronic interstitial prominence. Stable cardiomediastinal contours. IMPRESSION: Persistent or recurrent small left pleural effusion and left basilar atelectasis. Electronically Signed   By: Macy Mis M.D.   On: 12/03/2020 08:33    Procedures Procedures (including critical care time)  Medications Ordered in ED Medications  pantoprazole (PROTONIX) injection 40 mg (40 mg Intravenous Given 12/03/20 0821)  metoprolol tartrate (LOPRESSOR) injection 2.5 mg (2.5 mg Intravenous Given 12/03/20 0910)  iohexol (OMNIPAQUE) 300 MG/ML solution 80 mL (80 mLs Intravenous Contrast Given 12/03/20 1158)    ED Course  I have reviewed the triage vital signs and the nursing notes.  Pertinent labs & imaging results that were available during my care of the patient were reviewed by me and considered in my medical decision making (see chart for details). Patient with epigastric pain which is most likely peptic ulcer disease.  I spoke with cardiology about the small elevation of the troponin and they felt like the patient could go home.  She does have a tib-fib chronically   MDM Rules/Calculators/A&P                          Patient with epigastric pain she will be placed on Protonix.  Doubt coronary artery disease.  Cardiology has been consulted on the phone and feels like patient does not need admission Final Clinical Impression(s) / ED Diagnoses Final diagnoses:  Esophagitis    Rx / DC Orders ED Discharge Orders         Ordered    pantoprazole (PROTONIX) 20 MG tablet  Daily        12/03/20 1500           Paige Ferguson, MD 12/04/20 [phone]

## 2020-12-10 ENCOUNTER — Telehealth: Payer: Self-pay

## 2020-12-10 NOTE — Telephone Encounter (Signed)
I connected by phone with Paige Ferguson and/or patient's caregiver on 12/10/2020 at 12:18 PM to discuss the potential vaccination through our Homebound vaccination initiative.   Prevaccination Checklist for COVID-19 Vaccines  1.  Are you feeling sick today? no  2.  Have you ever received a dose of a COVID-19 vaccine?  yes      If yes, which one? Moderna   How many dose of Covid-19 vaccine have your received and dates ? 2 on 02/14/20 and 03/08/20.   Check all that apply: I live in a long-term care setting. no  I have been diagnosed with a medical condition(s). Please list: 87 years old, has CHF and Afib., homebound. (pertinent to homebound status)  I am a first responder. no  I work in a long-term care facility, correctional facility, hospital, restaurant, retail setting, school, or other setting with high exposure to the public. no  4. Do you have a health condition or are you undergoing treatment that makes you moderately or severely immunocompromised? (This would include treatment for cancer or HIV, receipt of organ transplant, immunosuppressive therapy or high-dose corticosteroids, CAR-T-cell therapy, hematopoietic cell transplant [HCT], DiGeorge syndrome or Wiskott-Aldrich syndrome)  no  5. Have you received hematopoietic cell transplant (HCT) or CAR-T-cell therapies since receiving COVID-19 vaccine? no  6.  Have you ever had an allergic reaction: (This would include a severe reaction [ e.g., anaphylaxis] that required treatment with epinephrine or EpiPen or that caused you to go to the hospital.  It would also include an allergic reaction that occurred within 4 hours that caused hives, swelling, or respiratory distress, including wheezing.) A.  A previous dose of COVID-19 vaccine. no  B.  A vaccine or injectable therapy that contains multiple components, one of which is a COVID-19 vaccine component, but it is not known which component elicited the immediate reaction. no  C.  Are you allergic  to polyethylene glycol? no  D. Are you allergic to Polysorbate, which is found in some vaccines, film coated tablets and intravenous steroids?  no   7.  Have you ever had an allergic reaction to another vaccine (other than COVID-19 vaccine) or an injectable medication? (This would include a severe reaction [ e.g., anaphylaxis] that required treatment with epinephrine or EpiPen or that caused you to go to the hospital.  It would also include an allergic reaction that occurred within 4 hours that caused hives, swelling, or respiratory distress, including wheezing.)  no   8.  Have you ever had a severe allergic reaction (e.g., anaphylaxis) to something other than a component of the COVID-19 vaccine, or any vaccine or injectable medication?  This would include food, pet, venom, environmental, or oral medication allergies.  Yes, Amoxicillin (unspecified reaction)  Check all that apply to you:  Am a female between ages 85 and 67 years old  no  Women 85 through 85 years of age can receive any FDA-authorized or -approved COVID-19 vaccine. However, they should be informed of the rare but increased risk of thrombosis with thrombocytopenia syndrome (TTS) after receipt of the [employer] Vaccine and the availability of other FDA-authorized and -approved COVID-19 vaccines. People who had TTS after a first dose of Janssen vaccine should not receive a subsequent dose of Janssen product    Am a female between ages 85 and 39 years old  no Males 85 through 85 years of age may receive the correct formulation of Pfizer-BioNTech COVID-19 vaccine. Males 18 and older can receive any FDA-authorized or -  approved vaccine. However, people receiving an mRNA COVID-19 vaccine, especially males 85 through 85 years of age and their parents/legal representative (when relevant), should be informed of the risk of developing myocarditis (an inflammation of the heart muscle) or pericarditis (inflammation of the lining around the heart)  after receipt of an mRNA vaccine. The risk of developing either myocarditis or pericarditis after vaccination is low, and lower than the risk of myocarditis associated with SARS-CoV-2 infection in adolescents and adults. Vaccine recipients should be counseled about the need to seek care if symptoms of myocarditis or pericarditis develop after vaccination     Have a history of myocarditis or pericarditis  no Myocarditis or pericarditis after receipt of the first dose of an mRNA COVID-19 vaccine series but before administration of the second dose  Experts advise that people who develop myocarditis or pericarditis after a dose of an mRNA COVID-19 vaccine not receive a subsequent dose of any COVID-19 vaccine, until additional safety data are available.  Administration of a subsequent dose of COVID-19 vaccine before safety data are available can be considered in certain circumstances after the episode of myocarditis or pericarditis has completely resolved. Until additional data are available, some experts recommend a Alphonsa Overall COVID-19 vaccine be considered instead of an mRNA COVID-19 vaccine. Decisions about proceeding with a subsequent dose should include a conversation between the patient, their parent/legal representative (when relevant), and their clinical team, which may include a cardiologist.    Have been treated with monoclonal antibodies or convalescent serum to prevent or treat COVID-19  no Vaccination should be offered to people regardless of history of prior symptomatic or asymptomatic SARS-CoV-2 infection. There is no recommended minimal interval between infection and vaccination.  However, vaccination should be deferred if a patient received monoclonal antibodies or convalescent serum as treatment for COVID-19 or for post-exposure prophylaxis. This is a precautionary measure until additional information becomes available, to avoid interference of the antibody treatment with vaccine-induced immune  responses.  Defer COVID-19 vaccination for 30 days when a passive antibody product was used for post-exposure prophylaxis.  Defer COVID-19 vaccination for 90 days when a passive antibody product was used to treat COVID-19.     Diagnosed with Multisystem Inflammatory Syndrome (MIS-C or MIS-A) after a COVID-19 infection  no It is unknown if people with a history of MIS-C or MIS-A are at risk for a dysregulated immune response to COVID-19 vaccination.  People with a history of MIS-C or MIS-A may choose to be vaccinated. Considerations for vaccination may include:   Clinical recovery from MIS-C or MIS-A, including return to normal cardiac function   Personal risk of severe acute COVID-19 (e.g., age, underlying conditions)   High or substantial community transmission of SARS-CoV-2 and personal increased risk of reinfection.   Timing of any immunomodulatory therapies (general best practice guidelines for immunization can be consulted for more information [email or web address])   It has been 90 days or more since their diagnosis of MIS-C   Onset of MIS-C occurred before any COVID-19 vaccination   A conversation between the patient, their guardian(s), and their clinical team or a specialist may assist with COVID-19 vaccination decisions. Healthcare providers and health departments may also request a consultation from the [facility] at [email or web address].     Have a bleeding disorder  No                 Take a blood thinner  yes Eliquis As with all vaccines, any COVID-19 vaccine product may  be given to these patients, if a physician familiar with the patient's bleeding risk determines that the vaccine can be administered intramuscularly with reasonable safety.  ACIP recommends the following technique for intramuscular vaccination in patients with bleeding disorders or taking  blood thinners: a fine-gauge needle (23-gauge or smaller caliber) should be used for the vaccination, followed by firm pressure on the site, without rubbing, for at least 2 minutes.  People who regularly take aspirin or anticoagulants as part of their routine medications do not need to stop these medications prior to receipt of any COVID-19 vaccine.    Have a history of heparin-induced thrombocytopenia (HIT)  no Although the etiology of TTS associated with the Alphonsa Overall COVID-19 vaccine is unclear, it appears to be similar to another rare immune-mediated syndrome, heparin-induced thrombocytopenia (HIT). People with a history of an episode of an immune-mediated syndrome characterized by thrombosis and thrombocytopenia, such as HIT, should be offered a currently FDA-approved or FDA-authorized mRNA COVID-19 vaccine if it has been ?90 days since their TTS resolved. After 90 days, patients may be vaccinated with any currently FDA-approved or FDA-authorized COVID-19 vaccine, including Janssen COVID-19 Vaccine. However, people who developed TTS after their initial Alphonsa Overall vaccine should not receive a Janssen booster dose.  Experts believe the following factors do not make people more susceptible to TTS after receipt of the [employer]. People with these conditions can be vaccinated with any FDA-authorized or - approved COVID-19 vaccine, including the [employer] COVID-19 Vaccine:   A prior history of venous thromboembolism   Risk factors for venous thromboembolism (e.g., inherited or acquired thrombophilia including Factor V Leiden; prothrombin gene 20210A mutation; antiphospholipid syndrome; protein C, protein S or antithrombin deficiency   A prior history of other types of thromboses not associated with thrombocytopenia   Pregnancy, post-partum status, or receipt of hormonal contraceptives (e.g., combined oral contraceptives, patch, ring)   Additional recipient education materials can be found at  [email or web address]-[email or web address]/ vaccines/safety/JJUpdate.html.    Am currently pregnant or breastfeeding  no Vaccination is recommended for all people aged 2 years and older, including people that are:   Pregnant   Breastfeeding   Trying to get pregnant now or who might become pregnant in the future   Pregnant, breastfeeding, and post-partum people 41 through 85 years of age should be aware of the rare risk of TTS after receipt of the Alphonsa Overall COVID-19 Vaccine and the availability of other FDA-authorized or -approved COVID-19 vaccines (i.e., mRNA vaccines).    Have received dermal fillers  no FDA-authorized or -approved COVID-19 vaccines can be administered to people who have received injectable dermal fillers who have no contraindications for vaccination.  Infrequently, these people might experience temporary swelling at or near the site of filler injection (usually the face or lips) following administration of a dose of an mRNA COVID-19 vaccine. These people should be advised to contact their healthcare provider if swelling develops at or near the site of dermal filler following vaccination.     Have a history of Guillain-Barr Syndrome (GBS)  no People with a history of GBS can receive any FDA-authorized or -approved COVID-19 vaccine. However, given the possible association between the [employer] and an increased risk of GBS, a patient with a history of GBS and their clinical team should discuss the availability of mRNA vaccines to offer protection against COVID-19. The highest risk has been observed in men aged 34-64 years with symptoms of GBS beginning within 42 days after Alphonsa Overall COVID-19 vaccination.  People who had  GBS after receiving Alphonsa Overall vaccine should be made aware of the option to receive an mRNA COVID-19 vaccine booster at least 2 months (8 weeks) after the Janssen dose. However, Alphonsa Overall vaccine may be used as a booster, particularly if GBS occurred more than 42 days  after vaccination or was related to a non-vaccine factor. Prior to booster vaccination, a conversation between the patient and their clinical team may assist with decisions about use of a COVID-19 booster dose, including the timing of administration     Postvaccination Observation Times for People without Contraindications to Covid 19 Vaccination.  30 minutes:  People with a history of: A contraindication to another type of COVID-19 vaccine product (i.e., mRNA or viral vector COVID-19 vaccines)   Immediate (within 4 hours of exposure) non-severe allergic reaction to a COVID-19 vaccine or injectable therapies   Anaphylaxis due to any cause   Immediate allergic reaction of any severity to a non-COVID-19 vaccine   15 minutes: All other people  This patient is a 87 y.o. female that meets the FDA criteria to receive homebound vaccination. Patient or parent/caregiver understands they have the option to accept or refuse homebound vaccination.  Patient passed the pre-screening checklist and would like to proceed with homebound vaccination.  Based on questionnaire above, I recommend the patient be observed for 15 minutes.  There are an estimated #0 other household members/caregivers who are also interested in receiving the vaccine.    The patient has been confirmed homebound and eligible for homebound vaccination with the considerations outlined above. I will send the patient's information to our scheduling team who will reach out to schedule the patient and potential caregiver/family members for homebound vaccination.    Dan Humphreys 12/10/2020 12:18 PM

## 2020-12-12 DIAGNOSIS — I509 Heart failure, unspecified: Secondary | ICD-10-CM | POA: Diagnosis not present

## 2020-12-12 DIAGNOSIS — R0602 Shortness of breath: Secondary | ICD-10-CM | POA: Diagnosis not present

## 2020-12-12 DIAGNOSIS — R609 Edema, unspecified: Secondary | ICD-10-CM | POA: Diagnosis not present

## 2020-12-15 ENCOUNTER — Other Ambulatory Visit: Payer: Self-pay

## 2020-12-15 MED ORDER — MUPIROCIN 2 % EX OINT: 1.0000 "application " | TOPICAL_OINTMENT | Freq: Two times a day (BID) | CUTANEOUS | 2 refills | Status: DC

## 2020-12-20 NOTE — Telephone Encounter (Signed)
11/30/2020 Pt's grandson left a voice message on the [facility] home bound hotline requesting Moderna booster.  Contacted pt's grandson, Preston Fleeting who states he is her caregiver and POA.  Prescreening completed and pt is now on the schedule for booster.  Dan Humphreys, BSN-RN-CCM

## 2020-12-23 ENCOUNTER — Other Ambulatory Visit: Payer: Self-pay

## 2020-12-23 ENCOUNTER — Ambulatory Visit: Payer: Medicare Other | Admitting: Cardiovascular Disease

## 2020-12-23 ENCOUNTER — Encounter: Payer: Self-pay | Admitting: Cardiovascular Disease

## 2020-12-23 VITALS — BP 142/80 | HR 96 | Ht <= 58 in | Wt 130.0 lb

## 2020-12-23 DIAGNOSIS — I5032 Chronic diastolic (congestive) heart failure: Secondary | ICD-10-CM | POA: Diagnosis not present

## 2020-12-23 DIAGNOSIS — I4819 Other persistent atrial fibrillation: Secondary | ICD-10-CM

## 2020-12-23 NOTE — Progress Notes (Signed)
Chief Complaint  Patient presents with   Follow-up    Chronic diastolic CHF   History of Present Illness: 87 yo female with history of persistent chronic diastolic CHF, atrial fibrillation, chronic kidney disease, hyperlipidemia, HTN and TIA who is here today for cardiac follow up. I saw her as a new patient in July 2020. She had the onset of lower extremity edema in 2020 and was started on Lasix. No known cardiac issues. Her EKG at her first visit with me showed atrial fibrillation. She had no awareness of this and no palpitations at home. She was started on Eliquis. Her Coreg was continued. Echo July 2020 with normal LV systolic function, VFIE=33-29%. No significant valve disease. She was seen in the ED 12/03/20 with epigastric pain that was not felt to be cardiac related.   She is here today for follow up. The patient denies any chest pain, dyspnea, palpitations, lower extremity edema, orthopnea, PND, dizziness, near syncope or syncope.   Primary Care Physician: Sharion Balloon, FNP  Past Medical History:  Diagnosis Date   Allergy    Arthritis of knee    Cataract    Cholelithiases    Chronic kidney disease    Endometrial polyp 7/98   2 degree polyps    Genu valgum    Gout    Hyperlipidemia    Hypertension    Left knee pain    Osteoarthritis    Osteoporosis    Postmenopausal bleeding    Recurrent UTI    Renal abscess, right    Shoulder fracture, left    TIA (transient ischemic attack)    Ureteral calculi    URI (upper respiratory infection)     Past Surgical History:  Procedure Laterality Date   EYE SURGERY     HERNIA REPAIR     HYSTEROSCOPY , FRACTIONAL D&C  7/98/   INCISIONAL HERNIA REPAIR  1983   LEFT CATARACT SURGERY  10/99   RIGHT CATARACT SURGERY  7/99   Dr. Dolores Lory    RIGHT HEMINEPHRECTOMY FOR ABSCESS  2/98   RIGHT NEPHRELITHOTOMY  1982    Current Outpatient Medications  Medication Sig Dispense Refill   amLODipine (NORVASC) 5  MG tablet Take 1 tablet (5 mg total) by mouth daily. 90 tablet 2   apixaban (ELIQUIS) 5 MG TABS tablet Take 1 tablet (5 mg total) by mouth 2 (two) times daily. 60 tablet 5   benazepril (LOTENSIN) 40 MG tablet Take 1 tablet (40 mg total) by mouth daily. 90 tablet 2   Calcium-Magnesium-Vitamin D (CALCIUM 1200+D3 PO) Take by mouth.     carvedilol (COREG) 6.25 MG tablet Take 1 tablet (6.25 mg total) by mouth 2 (two) times daily with a meal. 180 tablet 3   diphenhydrAMINE-zinc acetate (BENADRYL ITCH STOPPING) cream Apply topically 3 (three) times daily as needed for itching. 28.3 g 0   furosemide (LASIX) 40 MG tablet Take 1 tablet (40 mg total) by mouth 2 (two) times daily. (Needs to be seen before next refill) 180 tablet 2   meloxicam (MOBIC) 7.5 MG tablet Take 1 tablet (7.5 mg total) by mouth daily. 90 tablet 4   Misc Natural Products (GNP GLUCOSAME CHONDROITIN DS) TABS Take by mouth.     Multiple Vitamins-Minerals (CENTRUM SILVER ADULT 50+) TABS Take 1 tablet by mouth daily.     mupirocin ointment (BACTROBAN) 2 % Apply 1 application topically 2 (two) times daily. 22 g 2   Omega-3 Fatty Acids (FISH OIL) 1000 MG CAPS Take  1 capsule by mouth 2 (two) times daily.     pantoprazole (PROTONIX) 20 MG tablet Take 1 tablet (20 mg total) by mouth daily. 30 tablet 0   raloxifene (EVISTA) 60 MG tablet Take 1 tablet (60 mg total) by mouth daily. 90 tablet 2   terbinafine (LAMISIL AT) 1 % cream Apply 1 application topically 2 (two) times daily. 30 g 0   vitamin E 200 UNIT capsule Take 200 Units by mouth daily.     No current facility-administered medications for this visit.    Allergies  Allergen Reactions   Sulfa Antibiotics Nausea And Vomiting   Amoxicillin    Nitrofurantoin Monohyd Macro     Social History   Socioeconomic History   Marital status: Widowed    Spouse name: Not on file   Number of children: Not on file   Years of education: Not on file   Highest education  level: Not on file  Occupational History   Not on file  Tobacco Use   Smoking status: Former Smoker    Types: Cigarettes    Quit date: 11/20/1944    Years since quitting: 76.1   Smokeless tobacco: Never Used  Vaping Use   Vaping Use: Never used  Substance and Sexual Activity   Alcohol use: No   Drug use: No   Sexual activity: Never  Other Topics Concern   Not on file  Social History Narrative   WIDOWED   3 CHILDREN 1 DECEASED   Social Determinants of Health   Financial Resource Strain: Not on file  Food Insecurity: Not on file  Transportation Needs: Not on file  Physical Activity: Not on file  Stress: Not on file  Social Connections: Not on file  Intimate Partner Violence: Not on file    Family History  Problem Relation Age of Onset   Stroke Maternal Grandmother    Hypertension Maternal Grandmother    Diabetes Maternal Grandmother    Pneumonia Father    Coronary artery disease Paternal Grandfather    Heart defect Son     Review of Systems:  As stated in the HPI and otherwise negative.   BP (!) 142/80    Pulse 96    Ht 4\' 8"  (1.422 m)    Wt 130 lb (59 kg)    SpO2 97%    BMI 29.15 kg/m   Physical Examination: General: Well developed, well nourished, NAD  HEENT: OP clear, mucus membranes moist  SKIN: warm, dry. No rashes. Neuro: No focal deficits  Musculoskeletal: Muscle strength 5/5 all ext  Psychiatric: Mood and affect normal  Neck: No JVD, no carotid bruits, no thyromegaly, no lymphadenopathy.  Lungs:Clear bilaterally, no wheezes, rhonci, crackles Cardiovascular: Regular rate and rhythm. No murmurs, gallops or rubs. Abdomen:Soft. Bowel sounds present. Non-tender.  Extremities: No lower extremity edema. Pulses are 2 + in the bilateral DP/PT.  EKG:  EKG is not  ordered today.  The EKG is reviewed and demonstrates   Echo 06/04/19: 1. The left ventricle has normal systolic function with an ejection  fraction of 60-65%. The cavity size was  normal. Left ventricular diastolic  Doppler parameters are consistent with impaired relaxation.  2. The right ventricle has normal systolic function. The cavity was  normal. There is no increase in right ventricular wall thickness.  3. Left atrial size was moderately dilated.  4. Mild thickening of the mitral valve leaflet. Mild calcification of the  mitral valve leaflet.  5. The inferior vena cava was dilated in  size with >50% respiratory  variability.    Recent Labs: 12/03/2020: ALT 16; BUN 38; Creatinine, Ser 1.10; Hemoglobin 13.0; Platelets 252; Potassium 3.6; Sodium 145   Lipid Panel    Component Value Date/Time   CHOL 137 01/01/2019 1500   TRIG 58 01/01/2019 1500   HDL 44 01/01/2019 1500   CHOLHDL 3.1 01/01/2019 1500   CHOLHDL 3.4 06/05/2013 1007   VLDL 18 06/05/2013 1007   LDLCALC 81 01/01/2019 1500     Wt Readings from Last 3 Encounters:  12/23/20 130 lb (59 kg)  12/03/20 135 lb (61.2 kg)  11/26/20 135 lb (61.2 kg)    Assessment and Plan:   1. Chronic Diastolic CHF: Echo July 9629 with normal LV systolic function. She has no volume overload. Weight is stable. Continue Lasix.     2. Atrial fibrillation, persistent: She is in atrial fibrillation today. Her heart rate is controlled. Continue Coreg and Eliquis. She had a BMET and CBC recently.   Current medicines are reviewed at length with the patient today.  The patient does not have concerns regarding medicines.  The following changes have been made:  no change  Labs/ tests ordered today include:   No orders of the defined types were placed in this encounter.  Disposition:   FU with me in 12 months  Signed, Lauree Chandler, MD 12/23/2020 12:03 PM    [facility] [address], [place], [place]  [address] Phone: [phone]; Fax: [phone]

## 2020-12-23 NOTE — Patient Instructions (Signed)
Medication Instructions:  Your physician recommends that you continue on your current medications as directed. Please refer to the Current Medication list given to you today. *If you need a refill on your cardiac medications before your next appointment, please call your pharmacy*   Lab Work: None today   Testing/Procedures: none   Follow-Up: At [facility], you and your health needs are our priority.  As part of our continuing mission to provide you with exceptional heart care, we have created designated Provider Care Teams.  These Care Teams include your primary Cardiologist (physician) and Advanced Practice Providers (APPs -  Physician Assistants and Nurse Practitioners) who all work together to provide you with the care you need, when you need it.    Your next appointment:   1 year(s)  The format for your next appointment:   In Person  Provider:   Lauree Chandler, MD

## 2020-12-27 ENCOUNTER — Telehealth: Payer: Medicare Other

## 2021-01-03 DIAGNOSIS — I509 Heart failure, unspecified: Secondary | ICD-10-CM | POA: Diagnosis not present

## 2021-01-03 DIAGNOSIS — R0602 Shortness of breath: Secondary | ICD-10-CM | POA: Diagnosis not present

## 2021-01-07 ENCOUNTER — Ambulatory Visit: Payer: Self-pay | Admitting: Licensed Clinical Social Worker

## 2021-01-07 DIAGNOSIS — G459 Transient cerebral ischemic attack, unspecified: Secondary | ICD-10-CM

## 2021-01-07 DIAGNOSIS — I10 Essential (primary) hypertension: Secondary | ICD-10-CM

## 2021-01-07 DIAGNOSIS — I4891 Unspecified atrial fibrillation: Secondary | ICD-10-CM

## 2021-01-07 DIAGNOSIS — M170 Bilateral primary osteoarthritis of knee: Secondary | ICD-10-CM

## 2021-01-07 DIAGNOSIS — E782 Mixed hyperlipidemia: Secondary | ICD-10-CM

## 2021-01-07 NOTE — Chronic Care Management (AMB) (Signed)
Chronic Care Management    Clinical Social Work Follow Up Note  01/07/2021 Name: Paige Ferguson MRN: [id number] DOB: 12-30-1920  Paige Ferguson is a 87 y.o. year old female who is a primary care patient of Sharion Balloon, FNP. The CCM team was consulted for assistance with [name] .   Review of patient status, including review of consultants reports, other relevant assessments, and collaboration with appropriate care team members and the patient's provider was performed as part of comprehensive patient evaluation and provision of chronic care management services.    SDOH (Social Determinants of Health) assessments performed: No; risk for depression; risk for tobacco use; risk for stress; risk for physical inactivity  Flowsheet Row Chronic Care Management from 04/14/2020 in [facility]  PHQ-9 Total Score 4      GAD 7 : Generalized Anxiety Score 04/14/2020  Nervous, Anxious, on Edge 0  Control/stop worrying 0  Worry too much - different things 0  Trouble relaxing 0  Restless 0  Easily annoyed or irritable 0  Afraid - awful might happen 0  Total GAD 7 Score 0  Anxiety Difficulty Somewhat difficult    Outpatient Encounter Medications as of 01/07/2021  Medication Sig Note  . amLODipine (NORVASC) 5 MG tablet Take 1 tablet (5 mg total) by mouth daily.   Marland Kitchen apixaban (ELIQUIS) 5 MG TABS tablet Take 1 tablet (5 mg total) by mouth 2 (two) times daily.   . benazepril (LOTENSIN) 40 MG tablet Take 1 tablet (40 mg total) by mouth daily.   . Calcium-Magnesium-Vitamin D (CALCIUM 1200+D3 PO) Take by mouth.   . carvedilol (COREG) 6.25 MG tablet Take 1 tablet (6.25 mg total) by mouth 2 (two) times daily with a meal.   . diphenhydrAMINE-zinc acetate (BENADRYL ITCH STOPPING) cream Apply topically 3 (three) times daily as needed for itching.   . furosemide (LASIX) 40 MG tablet Take 1 tablet (40 mg total) by mouth 2 (two) times daily. (Needs to be seen before next refill)    . meloxicam (MOBIC) 7.5 MG tablet Take 1 tablet (7.5 mg total) by mouth daily. 12/03/2020: Takes every 3 days  . Misc Natural Products (GNP GLUCOSAME CHONDROITIN DS) TABS Take by mouth.   . Multiple Vitamins-Minerals (CENTRUM SILVER ADULT 50+) TABS Take 1 tablet by mouth daily.   . mupirocin ointment (BACTROBAN) 2 % Apply 1 application topically 2 (two) times daily.   . Omega-3 Fatty Acids (FISH OIL) 1000 MG CAPS Take 1 capsule by mouth 2 (two) times daily.   . pantoprazole (PROTONIX) 20 MG tablet Take 1 tablet (20 mg total) by mouth daily.   . raloxifene (EVISTA) 60 MG tablet Take 1 tablet (60 mg total) by mouth daily.   Marland Kitchen terbinafine (LAMISIL AT) 1 % cream Apply 1 application topically 2 (two) times daily.   . vitamin E 200 UNIT capsule Take 200 Units by mouth daily.    No facility-administered encounter medications on file as of 01/07/2021.    Goals    .  Client will talk with LCSW in next 30 days to discuss client completion of ADLs and daily activities (pt-stated)      CARE PLAN ENTRY   Current Barriers:  Marland Kitchen Mobility issues (uses a walker to help her walk) . Risk for falls in client with Chronic Diagnoses of HTN, HLD, OA, TIA, CHF, Atrial Fibrillation . Needs assistance for meal preparation  Clinical Social Work Clinical Goal(s):  Marland Kitchen LCSW will talk with client in next  30 days to talk with her about client completion of ADLs and client completion of daily activities  Interventions: . Talked with Chari Manning, grandson of client, about client needs.  . Talked with Elberta Fortis about client concerns over COVID 19 booster shot. Elberta Fortis said that nurse had come to home of client and given client the first two vaccines for COVID 19 but no nurse had contacted them about a time for client to get booster shot . LCSW called [facility] hospital today and left phone message for Jalene Mullet [phone]) . LCSW, in phone message for Jalene Mullet asked Rosaria Ferries to please  call Chari Manning, grandson of Paige Ferguson, on next Monday to talk with Elberta Fortis about setting up home appointment for Paige Ferguson to receive booster dose requested by client and family for [place] -36. Marland Kitchen LCSW called Chari Manning back on 01/07/2021 and informed him of above information and said he should get a call next Monday from Jalene Mullet to address concerns of client regarding her getting her booster shot for COVID 19     Patient Self Care Activities:   Eats with set up assistance Takes medications as prescribed  Patient Self Care Deficits: Marland Kitchen Mobility issues . Need for oxygen to help her breathe  Initial goal documentation     Follow Up Plan: LCSW will call client/[name] Dalton, grandsonin next 4 weeks to talk with client/Josephabout client completion of ADLs and daily activities  [name].Shaquna Geigle MSW, LCSW Licensed Clinical Social Worker [facility] Care Management [phone]

## 2021-01-07 NOTE — Patient Instructions (Addendum)
Licensed Clinical Social Worker Visit Information  Goals we discussed today:   .  Client will talk with LCSW in next 30 days to discuss client completion of ADLs and daily activities (pt-stated)        CARE PLAN ENTRY   Current Barriers:   Mobility issues (uses a walker to help her walk)  Risk for falls in client with Chronic Diagnoses of HTN, HLD, OA, TIA, CHF, Atrial Fibrillation  Needs assistance for meal preparation  Clinical Social Work Clinical Goal(s):   LCSW will talk with client in next 30 days to talk with her about client completion of ADLs and client completion of daily activities  Interventions:  Talked with Chari Manning, grandson of client, about client needs.   Talked with Elberta Fortis about client concerns over COVID 19 booster shot. Elberta Fortis said that nurse had come to home of client and given client the first two vaccines for COVID 19 but no nurse had contacted them about a time for client to get booster shot  LCSW called [facility] at [place] today and left phone message for Jalene Mullet [phone])  LCSW, in phone message for Jalene Mullet asked Rosaria Ferries to please call Chari Manning, grandson of LORRINDA RAMSTAD, on next Monday to talk with Elberta Fortis about setting up home appointment for [name] to receive booster dose requested by client and family for [place] -75.  LCSW called Chari Manning back on 01/07/2021 and informed him of above information and said he should get a call next Monday from Jalene Mullet to address concerns of client regarding her getting her booster shot for COVID 19     Patient Self Care Activities:   Eats with set up assistance Takes medications as prescribed  Patient Self Care Deficits:  Mobility issues  Need for oxygen to help her breathe  Initial goal documentation     Follow Up Plan: LCSW will call client/[name] Dalton, grandsonin next 4 weeks to talk with client/Josephabout client  completion of ADLs and daily activities  Materials Provided: No  The patient/Anthony Kirk Ruths, grandson of client, verbalized understanding of instructions provided today and declined a print copy of patient instruction materials.   [name].Einer Meals MSW, LCSW Licensed Clinical Social Worker [facility] Care Management [phone]

## 2021-01-12 DIAGNOSIS — R609 Edema, unspecified: Secondary | ICD-10-CM | POA: Diagnosis not present

## 2021-01-12 DIAGNOSIS — R0602 Shortness of breath: Secondary | ICD-10-CM | POA: Diagnosis not present

## 2021-01-12 DIAGNOSIS — I509 Heart failure, unspecified: Secondary | ICD-10-CM | POA: Diagnosis not present

## 2021-01-20 ENCOUNTER — Other Ambulatory Visit: Payer: Self-pay | Admitting: Family

## 2021-01-20 DIAGNOSIS — I509 Heart failure, unspecified: Secondary | ICD-10-CM

## 2021-01-25 ENCOUNTER — Other Ambulatory Visit: Payer: Self-pay

## 2021-01-25 ENCOUNTER — Encounter: Payer: Self-pay | Admitting: Physician Assistant

## 2021-01-25 ENCOUNTER — Ambulatory Visit: Payer: Medicare Other | Admitting: Physician Assistant

## 2021-01-25 ENCOUNTER — Ambulatory Visit: Payer: Medicare Other | Attending: Critical Care Medicine

## 2021-01-25 DIAGNOSIS — Z23 Encounter for immunization: Secondary | ICD-10-CM

## 2021-01-25 DIAGNOSIS — Z1283 Encounter for screening for malignant neoplasm of skin: Secondary | ICD-10-CM | POA: Diagnosis not present

## 2021-01-25 DIAGNOSIS — D045 Carcinoma in situ of skin of trunk: Secondary | ICD-10-CM

## 2021-01-25 DIAGNOSIS — L570 Actinic keratosis: Secondary | ICD-10-CM | POA: Diagnosis not present

## 2021-01-25 DIAGNOSIS — L820 Inflamed seborrheic keratosis: Secondary | ICD-10-CM

## 2021-01-25 DIAGNOSIS — C4492 Squamous cell carcinoma of skin, unspecified: Secondary | ICD-10-CM

## 2021-01-25 DIAGNOSIS — C44602 Unspecified malignant neoplasm of skin of right upper limb, including shoulder: Secondary | ICD-10-CM

## 2021-01-25 DIAGNOSIS — C4499 Other specified malignant neoplasm of skin, unspecified: Secondary | ICD-10-CM

## 2021-01-25 DIAGNOSIS — C4490 Unspecified malignant neoplasm of skin, unspecified: Secondary | ICD-10-CM

## 2021-01-25 DIAGNOSIS — D485 Neoplasm of uncertain behavior of skin: Secondary | ICD-10-CM

## 2021-01-25 DIAGNOSIS — L821 Other seborrheic keratosis: Secondary | ICD-10-CM

## 2021-01-25 HISTORY — DX: Unspecified malignant neoplasm of skin, unspecified: C44.90

## 2021-01-25 HISTORY — DX: Other specified malignant neoplasm of skin, unspecified: C44.99

## 2021-01-25 HISTORY — DX: Squamous cell carcinoma of skin, unspecified: C44.92

## 2021-01-25 NOTE — Patient Instructions (Signed)

## 2021-01-25 NOTE — Progress Notes (Signed)
   Covid-19 Vaccination Clinic  Name:  VERNIECE ENCARNACION    MRN: [id number] DOB: [birth year]  01/25/2021  Ms. Cregan was observed post Covid-19 immunization for 15 min without incident. She was provided with Vaccine Information Sheet and instruction to access the V-Safe system.   Ms. Gambone was instructed to call 911 with any severe reactions post vaccine: Marland Kitchen Difficulty breathing  . Swelling of face and throat  . A fast heartbeat  . A bad rash all over body  . Dizziness and weakness   Immunizations Administered    Name Date Dose VIS Date Route   Moderna Covid-19 Booster Vaccine 01/25/2021 12:05 PM 0.25 mL 09/08/2020 Intramuscular   Manufacturer: Moderna   Lot: [id number]   [place]: 68032-122-48

## 2021-01-26 ENCOUNTER — Other Ambulatory Visit: Payer: Self-pay | Admitting: Family

## 2021-01-26 ENCOUNTER — Ambulatory Visit: Payer: Medicare Other | Admitting: Cardiovascular Disease

## 2021-01-26 DIAGNOSIS — I10 Essential (primary) hypertension: Secondary | ICD-10-CM

## 2021-01-26 DIAGNOSIS — M170 Bilateral primary osteoarthritis of knee: Secondary | ICD-10-CM

## 2021-01-26 DIAGNOSIS — M1712 Unilateral primary osteoarthritis, left knee: Secondary | ICD-10-CM

## 2021-01-26 NOTE — Progress Notes (Deleted)
No chief complaint on file.  History of Present Illness: 87 yo female with history of persistent chronic diastolic CHF, atrial fibrillation, chronic kidney disease, hyperlipidemia, HTN and TIA who is here today for cardiac follow up. I saw her as a new patient in July 2020. She had the onset of lower extremity edema in 2020 and was started on Lasix. No known cardiac issues. Her EKG at her first visit with me showed atrial fibrillation. She had no awareness of this and no palpitations at home. She was started on Eliquis. Her Coreg was continued. Echo July 2020 with normal LV systolic function, EYCX=44-81%. No significant valve disease. She was seen in the ED 12/03/20 with epigastric pain that was not felt to be cardiac related.   She is here today for follow up. The patient denies any chest pain, dyspnea, palpitations, lower extremity edema, orthopnea, PND, dizziness, near syncope or syncope.   Primary Care Physician: Sharion Balloon, FNP  Past Medical History:  Diagnosis Date  . Allergy   . Arthritis of knee   . Cataract   . Cholelithiases   . Chronic kidney disease   . Endometrial polyp 7/98   2 degree polyps   . Genu valgum   . Gout   . Hyperlipidemia   . Hypertension   . Left knee pain   . Osteoarthritis   . Osteoporosis   . Postmenopausal bleeding   . Recurrent UTI   . Renal abscess, right   . Shoulder fracture, left   . TIA (transient ischemic attack)   . Ureteral calculi   . URI (upper respiratory infection)     Past Surgical History:  Procedure Laterality Date  . EYE SURGERY    . HERNIA REPAIR    . HYSTEROSCOPY , FRACTIONAL D&C  7/98/  . [place]  . LEFT CATARACT SURGERY  10/99  . RIGHT CATARACT SURGERY  7/99   Dr. Dolores Lory   . RIGHT HEMINEPHRECTOMY FOR ABSCESS  2/98  . RIGHT NEPHRELITHOTOMY  1982    Current Outpatient Medications  Medication Sig Dispense Refill  . amLODipine (NORVASC) 5 MG tablet Take 1 tablet (5 mg total) by mouth daily.  90 tablet 2  . apixaban (ELIQUIS) 5 MG TABS tablet Take 1 tablet (5 mg total) by mouth 2 (two) times daily. 60 tablet 5  . benazepril (LOTENSIN) 40 MG tablet Take 1 tablet (40 mg total) by mouth daily. 90 tablet 2  . Calcium-Magnesium-Vitamin D (CALCIUM 1200+D3 PO) Take by mouth.    . carvedilol (COREG) 6.25 MG tablet Take 1 tablet (6.25 mg total) by mouth 2 (two) times daily with a meal. 180 tablet 3  . diphenhydrAMINE-zinc acetate (BENADRYL ITCH STOPPING) cream Apply topically 3 (three) times daily as needed for itching. 28.3 g 0  . furosemide (LASIX) 40 MG tablet Take 1 tablet 2 times daily. (Needs to be seen before next refill) 60 tablet 0  . meloxicam (MOBIC) 7.5 MG tablet Take 1 tablet (7.5 mg total) by mouth daily. 90 tablet 4  . Misc Natural Products (GNP GLUCOSAME CHONDROITIN DS) TABS Take by mouth.    . Multiple Vitamins-Minerals (CENTRUM SILVER ADULT 50+) TABS Take 1 tablet by mouth daily.    . mupirocin ointment (BACTROBAN) 2 % Apply 1 application topically 2 (two) times daily. 22 g 2  . Omega-3 Fatty Acids (FISH OIL) 1000 MG CAPS Take 1 capsule by mouth 2 (two) times daily.    . pantoprazole (PROTONIX) 20 MG tablet  Take 1 tablet (20 mg total) by mouth daily. 30 tablet 0  . raloxifene (EVISTA) 60 MG tablet Take 1 tablet (60 mg total) by mouth daily. 90 tablet 2  . terbinafine (LAMISIL AT) 1 % cream Apply 1 application topically 2 (two) times daily. 30 g 0  . vitamin E 200 UNIT capsule Take 200 Units by mouth daily.     No current facility-administered medications for this visit.    Allergies  Allergen Reactions  . Sulfa Antibiotics Nausea And Vomiting  . Amoxicillin   . Nitrofurantoin Monohyd Macro     Social History   Socioeconomic History  . Marital status: Widowed    Spouse name: Not on file  . Number of children: Not on file  . Years of education: Not on file  . Highest education level: Not on file  Occupational History  . Not on file  Tobacco Use  . Smoking  status: Former Smoker    Types: Cigarettes    Quit date: 11/20/1944    Years since quitting: 76.2  . Smokeless tobacco: Never Used  Vaping Use  . Vaping Use: Never used  Substance and Sexual Activity  . Alcohol use: No  . Drug use: No  . Sexual activity: Never  Other Topics Concern  . Not on file  Social History Narrative   WIDOWED   3 CHILDREN 1 DECEASED   Social Determinants of Health   Financial Resource Strain: Not on file  Food Insecurity: Not on file  Transportation Needs: Not on file  Physical Activity: Not on file  Stress: Not on file  Social Connections: Not on file  Intimate Partner Violence: Not on file    Family History  Problem Relation Age of Onset  . Stroke Maternal Grandmother   . Hypertension Maternal Grandmother   . Diabetes Maternal Grandmother   . Pneumonia Father   . Coronary artery disease Paternal Grandfather   . Heart defect Son     Review of Systems:  As stated in the HPI and otherwise negative.   There were no vitals taken for this visit.  Physical Examination: General: Well developed, well nourished, NAD  HEENT: OP clear, mucus membranes moist  SKIN: warm, dry. No rashes. Neuro: No focal deficits  Musculoskeletal: Muscle strength 5/5 all ext  Psychiatric: Mood and affect normal  Neck: No JVD, no carotid bruits, no thyromegaly, no lymphadenopathy.  Lungs:Clear bilaterally, no wheezes, rhonci, crackles Cardiovascular: *** Irreg irreg. No murmurs, gallops or rubs. Abdomen:Soft. Bowel sounds present. Non-tender.  Extremities: No lower extremity edema. Pulses are 2 + in the bilateral DP/PT.    EKG:  EKG is *** ordered today.  The EKG is reviewed and demonstrates   Echo 06/04/19: 1. The left ventricle has normal systolic function with an ejection  fraction of 60-65%. The cavity size was normal. Left ventricular diastolic  Doppler parameters are consistent with impaired relaxation.  2. The right ventricle has normal systolic function.  The cavity was  normal. There is no increase in right ventricular wall thickness.  3. Left atrial size was moderately dilated.  4. Mild thickening of the mitral valve leaflet. Mild calcification of the  mitral valve leaflet.  5. The inferior vena cava was dilated in size with >50% respiratory  variability.    Recent Labs: 12/03/2020: ALT 16; BUN 38; Creatinine, Ser 1.10; Hemoglobin 13.0; Platelets 252; Potassium 3.6; Sodium 145   Lipid Panel    Component Value Date/Time   CHOL 137 01/01/2019 1500  TRIG 58 01/01/2019 1500   HDL 44 01/01/2019 1500   CHOLHDL 3.1 01/01/2019 1500   CHOLHDL 3.4 06/05/2013 1007   VLDL 18 06/05/2013 1007   LDLCALC 81 01/01/2019 1500     Wt Readings from Last 3 Encounters:  12/23/20 130 lb (59 kg)  12/03/20 135 lb (61.2 kg)  11/26/20 135 lb (61.2 kg)    Assessment and Plan:   1. Chronic Diastolic CHF: Echo July 2091 with normal LV systolic function. Weight is stable. No volume overload. Continue Lasix  2. Atrial fibrillation, persistent: Atrial fib today. HR controlled. Will continue Coreg and Eliquis.    Current medicines are reviewed at length with the patient today.  The patient does not have concerns regarding medicines.  The following changes have been made:  no change  Labs/ tests ordered today include:   No orders of the defined types were placed in this encounter.  Disposition:   FU with me in 12 months  Signed, Lauree Chandler, MD 01/26/2021 9:17 AM    [facility] [address], [place], [place]  [address] Phone: [phone]; Fax: [phone]

## 2021-01-27 ENCOUNTER — Telehealth: Payer: Medicare Other

## 2021-01-31 DIAGNOSIS — R0602 Shortness of breath: Secondary | ICD-10-CM | POA: Diagnosis not present

## 2021-01-31 DIAGNOSIS — I509 Heart failure, unspecified: Secondary | ICD-10-CM | POA: Diagnosis not present

## 2021-02-02 ENCOUNTER — Other Ambulatory Visit: Payer: Self-pay

## 2021-02-02 ENCOUNTER — Ambulatory Visit (INDEPENDENT_AMBULATORY_CARE_PROVIDER_SITE_OTHER): Payer: Medicare Other | Admitting: Family Medicine

## 2021-02-02 ENCOUNTER — Encounter: Payer: Self-pay | Admitting: Family Medicine

## 2021-02-02 VITALS — BP 123/76 | HR 86 | Temp 97.7°F | Ht <= 58 in | Wt 130.8 lb

## 2021-02-02 DIAGNOSIS — I4821 Permanent atrial fibrillation: Secondary | ICD-10-CM | POA: Diagnosis not present

## 2021-02-02 DIAGNOSIS — I679 Cerebrovascular disease, unspecified: Secondary | ICD-10-CM

## 2021-02-02 DIAGNOSIS — G459 Transient cerebral ischemic attack, unspecified: Secondary | ICD-10-CM | POA: Diagnosis not present

## 2021-02-02 NOTE — Progress Notes (Signed)
Subjective:  Patient ID: Paige Ferguson, female    DOB: 1921/02/22  Age: 87 y.o. MRN: [id number]  CC: No chief complaint on file.   HPI Paige Ferguson presents for an acute spell 2 days ago that was of concern for a mini stroke by her daughter who is her caregiver and is here with her and gives some of the history.  [name] cannot recall the events clearly enough to describe them.  Her daughter tells me that [name] was sitting on the commode and [name] son overheard a noise and went in to the bathroom.  [name] could not move or speak and was trembling and mouthing gibberish.  She was alert but not oriented apparently.  This lasted about a half of an hour.  The son asked [name] daughter to come to the home should and she did immediately since she lives close by.  She observed the behavior the patient's tongue was extruding and she was trying to make noises as if to speak but could not.  She also could not move.  At the end of this time she fairly quickly resumed normal cognition.  Of note is that she uses Eliquis as a blood thinner due to atrial fibrillation.  She also uses Coreg for heart rate control.  She has a history of elevated blood pressure and currently takes multiple medications for blood pressure control.  She had a similar episode 3 years ago.  Her daughter wants to know if she has had a stroke or if there is something else going on in the brain that might have caused this episode and the previous one.  Depression screen [facility] 2/9 02/02/2021 11/26/2020 10/19/2020  Decreased Interest 0 0 0  Down, Depressed, Hopeless 0 0 0  PHQ - 2 Score 0 0 0  Altered sleeping - - -  Tired, decreased energy - - -  Change in appetite - - -  Feeling bad or failure about yourself  - - -  Trouble concentrating - - -  Moving slowly or fidgety/restless - - -  Suicidal thoughts - - -  PHQ-9 Score - - -  Difficult doing work/chores - - -    History Paige Ferguson has a past medical history of Allergy,  Arthritis of knee, Cataract, Cholelithiases, Chronic kidney disease, Endometrial polyp (7/98), Genu valgum, Gout, Hyperlipidemia, Hypertension, Left knee pain, Osteoarthritis, Osteoporosis, Postmenopausal bleeding, Recurrent UTI, Renal abscess, right, Shoulder fracture, left, TIA (transient ischemic attack), Ureteral calculi, and URI (upper respiratory infection).   She has a past surgical history that includes RIGHT NEPHRELITHOTOMY (1982); Incisional hernia repair (1983); RIGHT HEMINEPHRECTOMY FOR ABSCESS (2/98); HYSTEROSCOPY , FRACTIONAL D&C (7/98/); RIGHT CATARACT SURGERY (7/99); LEFT CATARACT SURGERY (10/99); Hernia repair; and Eye surgery.   Her family history includes Coronary artery disease in her paternal grandfather; Diabetes in her maternal grandmother; Heart defect in her son; Hypertension in her maternal grandmother; Pneumonia in her father; Stroke in her maternal grandmother.She reports that she quit smoking about 76 years ago. Her smoking use included cigarettes. She has never used smokeless tobacco. She reports that she does not drink alcohol and does not use drugs.    ROS Review of Systems  Constitutional: Negative.   HENT: Negative.   Eyes: Negative for visual disturbance.  Respiratory: Negative for shortness of breath.   Cardiovascular: Negative for chest pain.  Gastrointestinal: Negative for abdominal pain.  Musculoskeletal: Negative for arthralgias.  Psychiatric/Behavioral: Negative for agitation, dysphoric mood and sleep disturbance.  The patient is not nervous/anxious.     Objective:  BP 123/76   Pulse 86   Temp 97.7 F (36.5 C)   Ht 4' 8"  (1.422 m)   Wt 130 lb 12.8 oz (59.3 kg)   SpO2 97%   BMI 29.32 kg/m   BP Readings from Last 3 Encounters:  02/02/21 123/76  12/23/20 (!) 142/80  12/03/20 123/73    Wt Readings from Last 3 Encounters:  02/02/21 130 lb 12.8 oz (59.3 kg)  12/23/20 130 lb (59 kg)  12/03/20 135 lb (61.2 kg)     Physical  Exam Constitutional:      General: She is not in acute distress.    Appearance: She is well-developed.  HENT:     Head: Normocephalic and atraumatic.  Eyes:     Conjunctiva/sclera: Conjunctivae normal.     Pupils: Pupils are equal, round, and reactive to light.  Neck:     Thyroid: No thyromegaly.  Cardiovascular:     Rate and Rhythm: Normal rate and regular rhythm.     Heart sounds: Normal heart sounds. No murmur heard.   Pulmonary:     Effort: Pulmonary effort is normal. No respiratory distress.     Breath sounds: Normal breath sounds. No wheezing or rales.  Abdominal:     General: Bowel sounds are normal. There is no distension.     Palpations: Abdomen is soft.     Tenderness: There is no abdominal tenderness.  Musculoskeletal:        General: Normal range of motion.     Cervical back: Normal range of motion and neck supple.  Lymphadenopathy:     Cervical: No cervical adenopathy.  Skin:    General: Skin is warm and dry.  Neurological:     Mental Status: She is alert and oriented to person, place, and time.  Psychiatric:        Mood and Affect: Mood normal.        Behavior: Behavior normal.        Thought Content: Thought content normal.       Assessment & Plan:   Diagnoses and all orders for this visit:  TIA (transient ischemic attack) -     MR Brain W Wo Contrast; Future -     MR Angiogram Head Wo Contrast; Future -     CMP14+EGFR -     CBC with Differential/Platelet  CVD (cerebrovascular disease) -     MR Brain W Wo Contrast; Future -     MR Angiogram Head Wo Contrast; Future -     CMP14+EGFR -     CBC with Differential/Platelet  Permanent atrial fibrillation (HCC) -     MR Brain W Wo Contrast; Future -     MR Angiogram Head Wo Contrast; Future       I am having Paige Ferguson. [place] maintain her GNP Glucosame Chondroitin DS, Centrum Silver Adult 50+, Fish Oil, vitamin E, Calcium-Magnesium-Vitamin D (CALCIUM 1200+D3 PO), carvedilol, benazepril,  amLODipine, raloxifene, apixaban, terbinafine, Benadryl Itch Stopping, pantoprazole, mupirocin ointment, furosemide, and meloxicam.  Allergies as of 02/02/2021      Reactions   Sulfa Antibiotics Nausea And Vomiting   Amoxicillin    Nitrofurantoin Monohyd Macro       Medication List       Accurate as of February 02, 2021  9:31 PM. If you have any questions, ask your nurse or doctor.        amLODipine 5 MG tablet Commonly known as:  NORVASC Take 1 tablet (5 mg total) by mouth daily.   apixaban 5 MG Tabs tablet Commonly known as: Eliquis Take 1 tablet (5 mg total) by mouth 2 (two) times daily.   Benadryl Itch Stopping cream Generic drug: diphenhydrAMINE-zinc acetate Apply topically 3 (three) times daily as needed for itching.   benazepril 40 MG tablet Commonly known as: LOTENSIN Take 1 tablet (40 mg total) by mouth daily.   CALCIUM 1200+D3 PO Take by mouth.   carvedilol 6.25 MG tablet Commonly known as: COREG Take 1 tablet (6.25 mg total) by mouth 2 (two) times daily with a meal.   Centrum Silver Adult 50+ Tabs Take 1 tablet by mouth daily.   Fish Oil 1000 MG Caps Take 1 capsule by mouth 2 (two) times daily.   furosemide 40 MG tablet Commonly known as: LASIX Take 1 tablet 2 times daily. (Needs to be seen before next refill)   GNP Glucosame Chondroitin DS Tabs Take by mouth.   meloxicam 7.5 MG tablet Commonly known as: MOBIC TAKE 1 TABLET DAILY   mupirocin ointment 2 % Commonly known as: BACTROBAN Apply 1 application topically 2 (two) times daily.   pantoprazole 20 MG tablet Commonly known as: PROTONIX Take 1 tablet (20 mg total) by mouth daily.   raloxifene 60 MG tablet Commonly known as: EVISTA Take 1 tablet (60 mg total) by mouth daily.   terbinafine 1 % cream Commonly known as: LamISIL AT Apply 1 application topically 2 (two) times daily.   vitamin E 200 UNIT capsule Take 200 Units by mouth daily.        Follow-up: No follow-ups on  file.  Claretta Fraise, M.D.

## 2021-02-03 ENCOUNTER — Emergency Department (HOSPITAL_COMMUNITY): Payer: Medicare Other

## 2021-02-03 ENCOUNTER — Telehealth: Payer: Self-pay | Admitting: Family

## 2021-02-03 ENCOUNTER — Encounter (HOSPITAL_COMMUNITY): Payer: Self-pay

## 2021-02-03 ENCOUNTER — Observation Stay (HOSPITAL_COMMUNITY)
Admission: EM | Admit: 2021-02-03 | Discharge: 2021-02-05 | Disposition: A | Discharge status: Home / Self Care | Payer: Medicare Other | Attending: Family Medicine | Admitting: Family Medicine

## 2021-02-03 ENCOUNTER — Telehealth: Payer: Self-pay

## 2021-02-03 ENCOUNTER — Other Ambulatory Visit: Payer: Self-pay

## 2021-02-03 DIAGNOSIS — I509 Heart failure, unspecified: Secondary | ICD-10-CM | POA: Insufficient documentation

## 2021-02-03 DIAGNOSIS — Z20822 Contact with and (suspected) exposure to covid-19: Secondary | ICD-10-CM | POA: Diagnosis not present

## 2021-02-03 DIAGNOSIS — I4891 Unspecified atrial fibrillation: Secondary | ICD-10-CM | POA: Diagnosis not present

## 2021-02-03 DIAGNOSIS — Z79899 Other long term (current) drug therapy: Secondary | ICD-10-CM | POA: Insufficient documentation

## 2021-02-03 DIAGNOSIS — M1712 Unilateral primary osteoarthritis, left knee: Secondary | ICD-10-CM

## 2021-02-03 DIAGNOSIS — R2681 Unsteadiness on feet: Secondary | ICD-10-CM | POA: Diagnosis not present

## 2021-02-03 DIAGNOSIS — I10 Essential (primary) hypertension: Secondary | ICD-10-CM

## 2021-02-03 DIAGNOSIS — Z743 Need for continuous supervision: Secondary | ICD-10-CM | POA: Diagnosis not present

## 2021-02-03 DIAGNOSIS — Z87891 Personal history of nicotine dependence: Secondary | ICD-10-CM | POA: Diagnosis not present

## 2021-02-03 DIAGNOSIS — I130 Hypertensive heart and chronic kidney disease with heart failure and stage 1 through stage 4 chronic kidney disease, or unspecified chronic kidney disease: Secondary | ICD-10-CM | POA: Insufficient documentation

## 2021-02-03 DIAGNOSIS — M170 Bilateral primary osteoarthritis of knee: Secondary | ICD-10-CM

## 2021-02-03 DIAGNOSIS — R5381 Other malaise: Secondary | ICD-10-CM | POA: Diagnosis not present

## 2021-02-03 DIAGNOSIS — E785 Hyperlipidemia, unspecified: Secondary | ICD-10-CM | POA: Diagnosis present

## 2021-02-03 DIAGNOSIS — Z7901 Long term (current) use of anticoagulants: Secondary | ICD-10-CM | POA: Insufficient documentation

## 2021-02-03 DIAGNOSIS — G459 Transient cerebral ischemic attack, unspecified: Principal | ICD-10-CM | POA: Insufficient documentation

## 2021-02-03 DIAGNOSIS — I6782 Cerebral ischemia: Secondary | ICD-10-CM | POA: Diagnosis not present

## 2021-02-03 DIAGNOSIS — R531 Weakness: Secondary | ICD-10-CM | POA: Diagnosis not present

## 2021-02-03 DIAGNOSIS — N189 Chronic kidney disease, unspecified: Secondary | ICD-10-CM | POA: Diagnosis not present

## 2021-02-03 DIAGNOSIS — R55 Syncope and collapse: Secondary | ICD-10-CM | POA: Diagnosis not present

## 2021-02-03 LAB — CBC WITH DIFFERENTIAL/PLATELET
Abs Immature Granulocytes: 0.01 10*3/uL (ref 0.00–0.07)
Basophils Absolute: 0 10*3/uL (ref 0.0–0.2)
Basophils Absolute: 0 10*3/uL (ref 0.0–0.1)
Basophils Relative: 1 %
Basos: 1 %
EOS (ABSOLUTE): 0.2 10*3/uL (ref 0.0–0.4)
Eos: 4 %
Eosinophils Absolute: 0.2 10*3/uL (ref 0.0–0.5)
Eosinophils Relative: 4 %
HCT: 38.4 % (ref 36.0–46.0)
Hematocrit: 34.2 % (ref 34.0–46.6)
Hemoglobin: 11.5 g/dL (ref 11.1–15.9)
Hemoglobin: 12.2 g/dL (ref 12.0–15.0)
Immature Grans (Abs): 0 10*3/uL (ref 0.0–0.1)
Immature Granulocytes: 0 %
Immature Granulocytes: 0 %
Lymphocytes Absolute: 2.3 10*3/uL (ref 0.7–3.1)
Lymphocytes Relative: 32 %
Lymphs Abs: 2.1 10*3/uL (ref 0.7–4.0)
Lymphs: 33 %
MCH: 33 pg (ref 26.6–33.0)
MCH: 32.2 pg (ref 26.0–34.0)
MCHC: 33.6 g/dL (ref 31.5–35.7)
MCHC: 31.8 g/dL (ref 30.0–36.0)
MCV: 98 fL — ABNORMAL HIGH (ref 79–97)
MCV: 101.3 fL — ABNORMAL HIGH (ref 80.0–100.0)
Monocytes Absolute: 1.3 10*3/uL — ABNORMAL HIGH (ref 0.1–0.9)
Monocytes Absolute: 1.2 10*3/uL — ABNORMAL HIGH (ref 0.1–1.0)
Monocytes Relative: 18 %
Monocytes: 18 %
Neutro Abs: 3 10*3/uL (ref 1.7–7.7)
Neutrophils Absolute: 3.1 10*3/uL (ref 1.4–7.0)
Neutrophils Relative %: 45 %
Neutrophils: 44 %
Platelets: 235 10*3/uL (ref 150–450)
Platelets: 241 10*3/uL (ref 150–400)
RBC: 3.49 x10E6/uL — ABNORMAL LOW (ref 3.77–5.28)
RBC: 3.79 MIL/uL — ABNORMAL LOW (ref 3.87–5.11)
RDW: 12.8 % (ref 11.7–15.4)
RDW: 13.2 % (ref 11.5–15.5)
WBC: 6.9 10*3/uL (ref 3.4–10.8)
WBC: 6.6 10*3/uL (ref 4.0–10.5)
nRBC: 0 % (ref 0.0–0.2)

## 2021-02-03 LAB — CMP14+EGFR
ALT: 15 IU/L (ref 0–32)
AST: 25 IU/L (ref 0–40)
Albumin/Globulin Ratio: 1.4 (ref 1.2–2.2)
Albumin: 3.7 g/dL (ref 3.5–4.6)
Alkaline Phosphatase: 70 IU/L (ref 44–121)
BUN/Creatinine Ratio: 32 — ABNORMAL HIGH (ref 12–28)
BUN: 29 mg/dL (ref 10–36)
Bilirubin Total: 0.5 mg/dL (ref 0.0–1.2)
CO2: 31 mmol/L — ABNORMAL HIGH (ref 20–29)
Calcium: 9.2 mg/dL (ref 8.7–10.3)
Chloride: 96 mmol/L (ref 96–106)
Creatinine, Ser: 0.92 mg/dL (ref 0.57–1.00)
Globulin, Total: 2.6 g/dL (ref 1.5–4.5)
Glucose: 101 mg/dL — ABNORMAL HIGH (ref 65–99)
Potassium: 4.2 mmol/L (ref 3.5–5.2)
Sodium: 140 mmol/L (ref 134–144)
Total Protein: 6.3 g/dL (ref 6.0–8.5)
eGFR: 57 mL/min/{1.73_m2} — ABNORMAL LOW (ref >59)

## 2021-02-03 LAB — COMPREHENSIVE METABOLIC PANEL
ALT: 17 U/L (ref 0–44)
AST: 24 U/L (ref 15–41)
Albumin: 3.5 g/dL (ref 3.5–5.0)
Alkaline Phosphatase: 58 U/L (ref 38–126)
Anion gap: 7 (ref 5–15)
BUN: 40 mg/dL — ABNORMAL HIGH (ref 8–23)
CO2: 34 mmol/L — ABNORMAL HIGH (ref 22–32)
Calcium: 9.2 mg/dL (ref 8.9–10.3)
Chloride: 95 mmol/L — ABNORMAL LOW (ref 98–111)
Creatinine, Ser: 1.09 mg/dL — ABNORMAL HIGH (ref 0.44–1.00)
GFR, Estimated: 46 mL/min — ABNORMAL LOW (ref >60)
Glucose, Bld: 134 mg/dL — ABNORMAL HIGH (ref 70–99)
Potassium: 4.2 mmol/L (ref 3.5–5.1)
Sodium: 136 mmol/L (ref 135–145)
Total Bilirubin: 0.7 mg/dL (ref 0.3–1.2)
Total Protein: 6.9 g/dL (ref 6.5–8.1)

## 2021-02-03 LAB — RAPID URINE DRUG SCREEN, HOSP PERFORMED
Amphetamines: NOT DETECTED
Barbiturates: NOT DETECTED
Benzodiazepines: NOT DETECTED
Cocaine: NOT DETECTED
Opiates: NOT DETECTED
Tetrahydrocannabinol: NOT DETECTED

## 2021-02-03 LAB — URINALYSIS, ROUTINE W REFLEX MICROSCOPIC
Bilirubin Urine: NEGATIVE
Glucose, UA: NEGATIVE mg/dL
Hgb urine dipstick: NEGATIVE
Ketones, ur: NEGATIVE mg/dL
Leukocytes,Ua: NEGATIVE
Nitrite: NEGATIVE
Protein, ur: NEGATIVE mg/dL
Specific Gravity, Urine: 1.006 (ref 1.005–1.030)
pH: 7 (ref 5.0–8.0)

## 2021-02-03 LAB — RESP PANEL BY RT-PCR (FLU A&B, COVID) ARPGX2
Influenza A by PCR: NEGATIVE
Influenza B by PCR: NEGATIVE
SARS Coronavirus 2 by RT PCR: NEGATIVE

## 2021-02-03 LAB — PROTIME-INR
INR: 1.4 — ABNORMAL HIGH (ref 0.8–1.2)
Prothrombin Time: 16.6 seconds — ABNORMAL HIGH (ref 11.4–15.2)

## 2021-02-03 IMAGING — CT CT HEAD W/O CM
3 series · 15 of 47 positions shown, 18 images · non-contrast
Comparison: None.

CLINICAL DATA: Syncope while getting hair cut. Recent TIA.
Hypertension.

EXAM:
CT HEAD WITHOUT CONTRAST
TECHNIQUE: Contiguous axial images were obtained from the base of the skull
through the vertex without intravenous contrast.

[Series 2: head w o · axial · 0.48mm/px · z∈[+40,+165]mm · 9 of 30 slices shown, 12 images]
[im 3/30  brain]
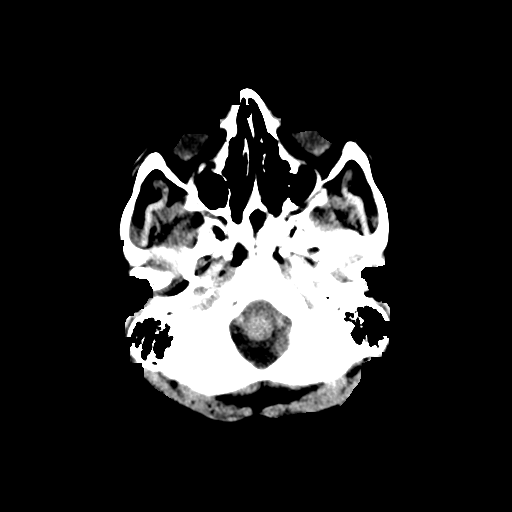
[im 3/30  bone]
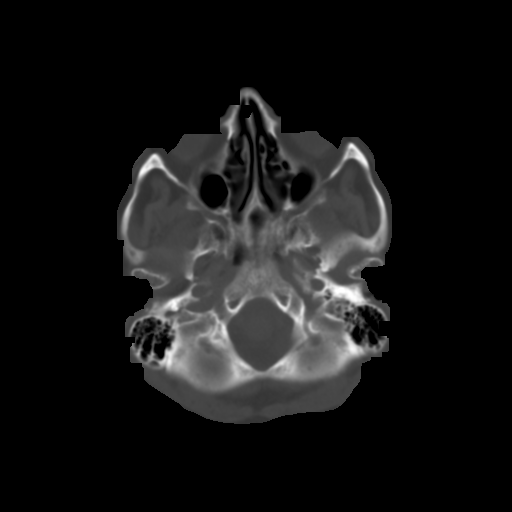
[im 6/30  brain]
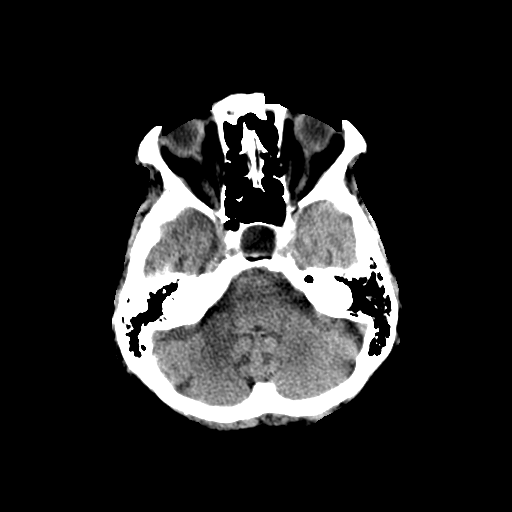
[im 9/30  brain]
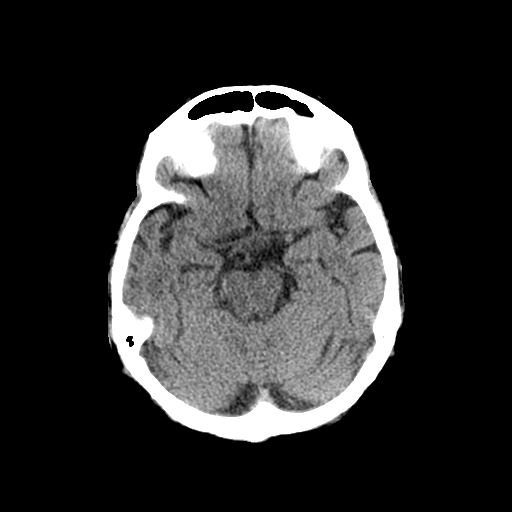
[im 12/30  brain]
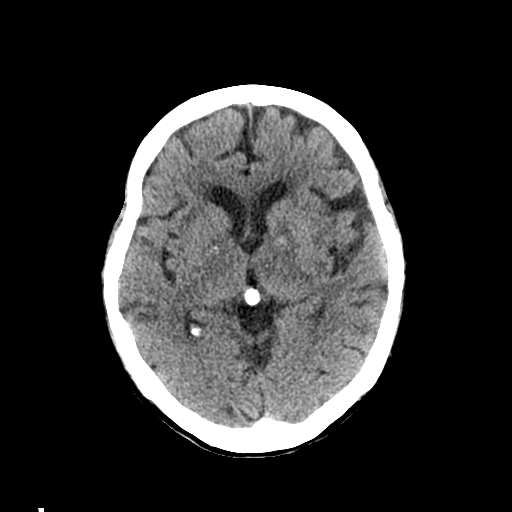
[im 16/30  brain]
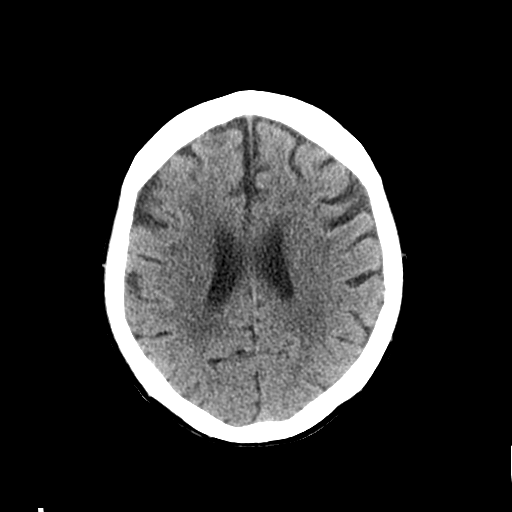
[im 16/30  bone]
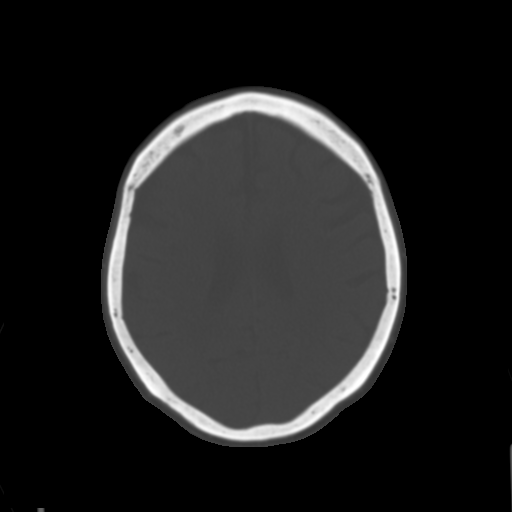
[im 19/30  brain]
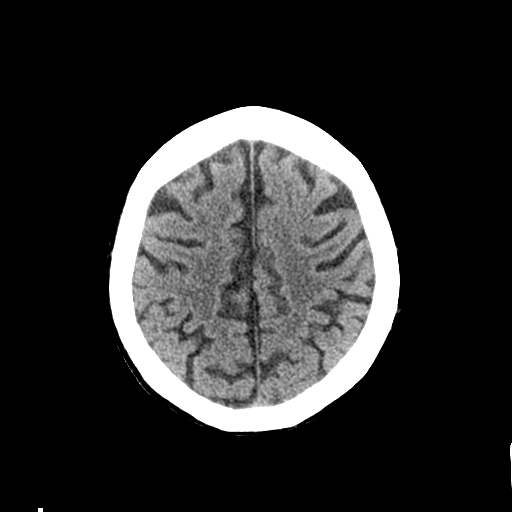
[im 22/30  brain]
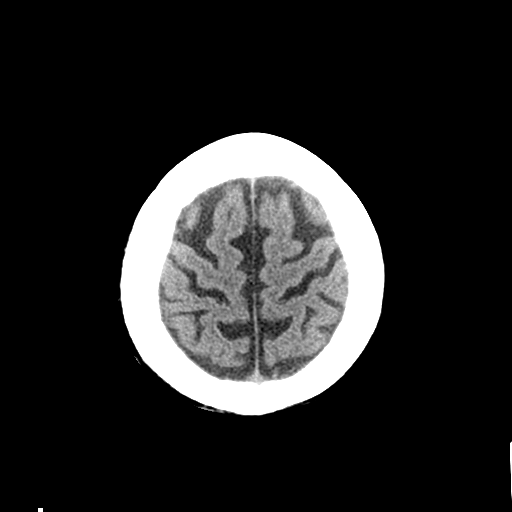
[im 25/30  brain]
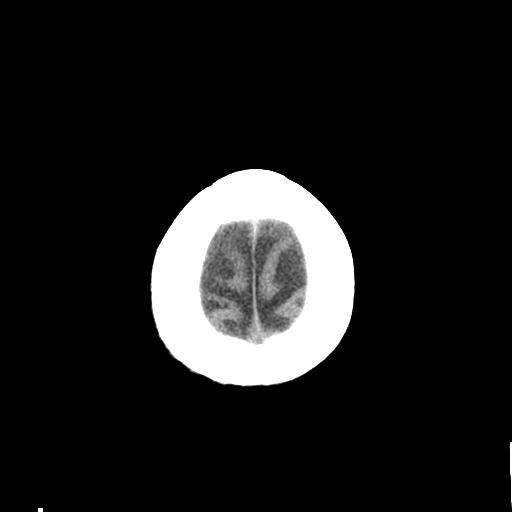
[im 28/30  brain]
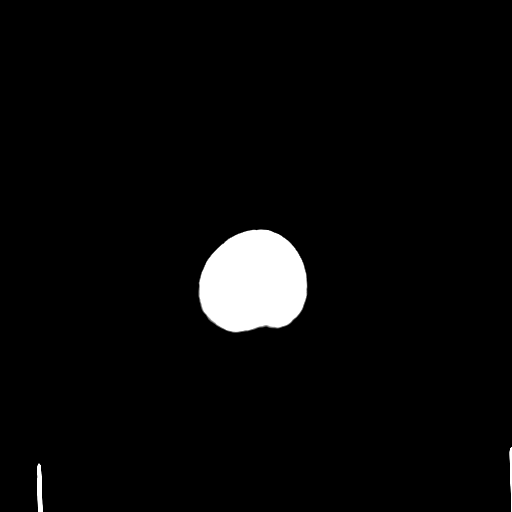
[im 28/30  bone]
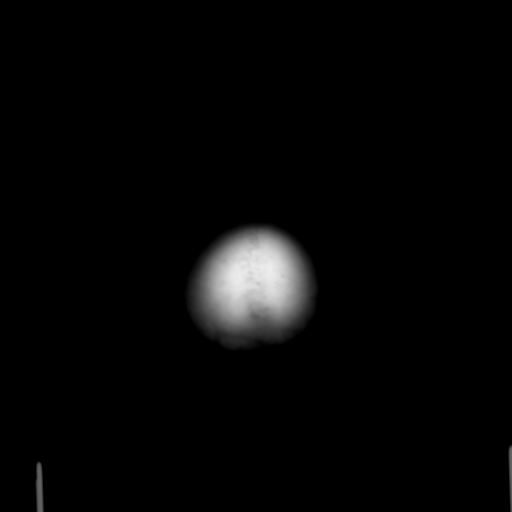

[Series 4: coronal soft · coronal · 0.30mm/px · 3 of 64 slices shown]
[im 22/64  brain]
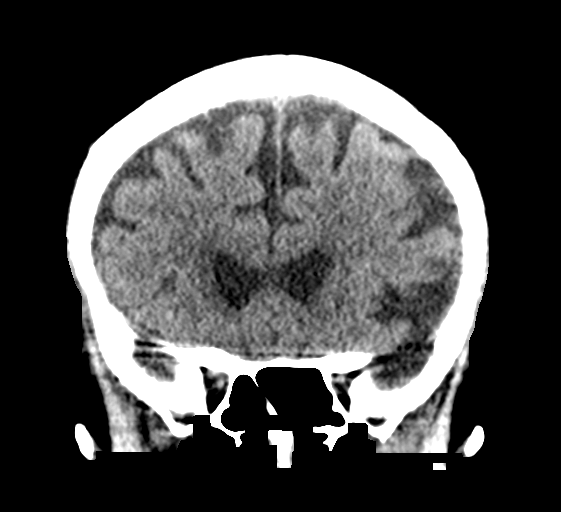
[im 29/64  brain]
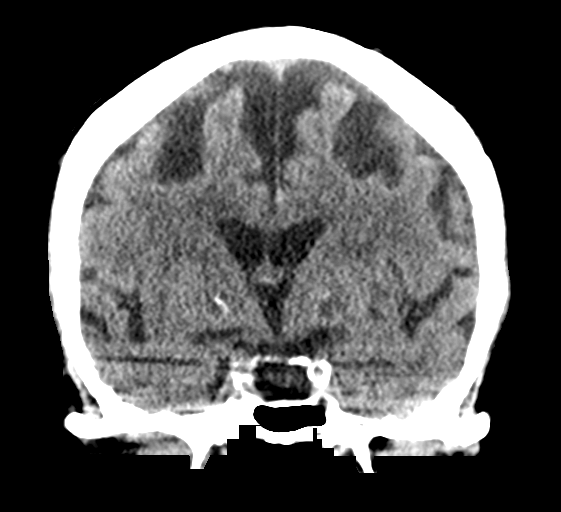
[im 36/64  brain]
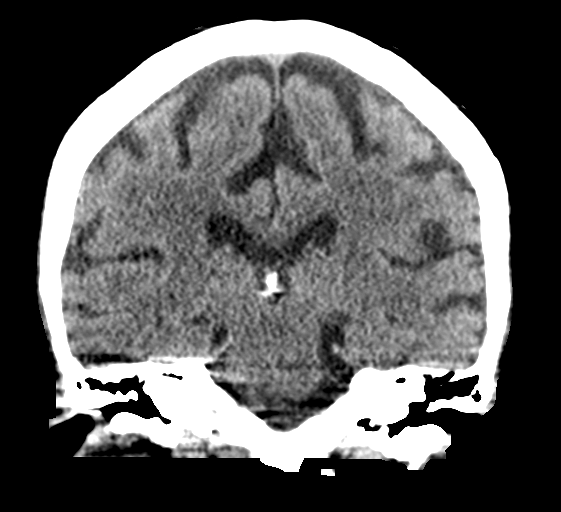

[Series 5: sagittal soft · sagittal · 0.31mm/px · 3 of 53 slices shown]
[im 18/53  brain]
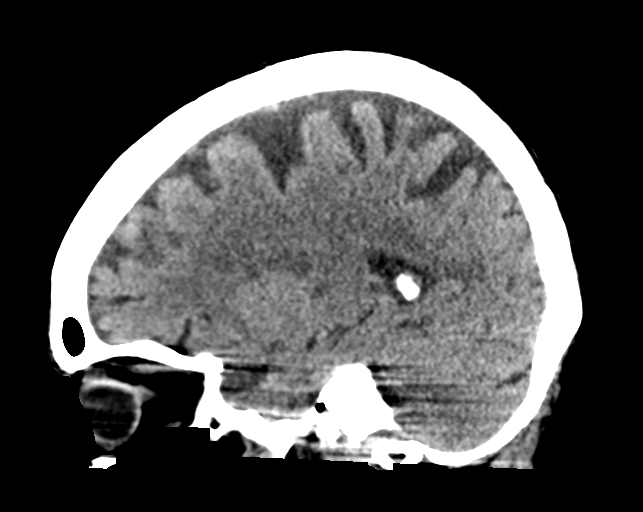
[im 27/53  brain]
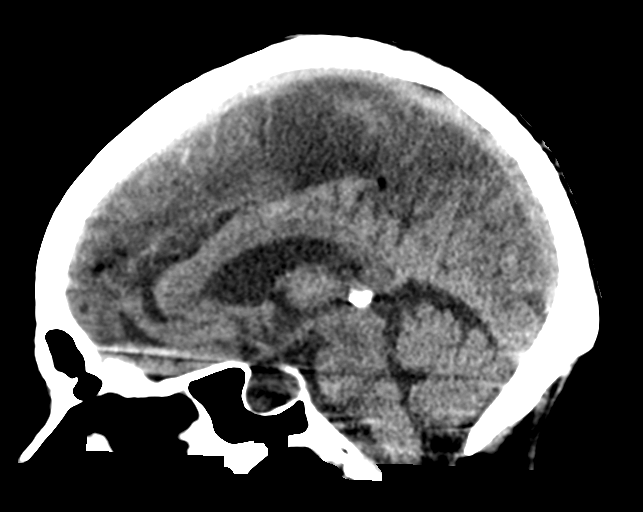
[im 35/53  brain]
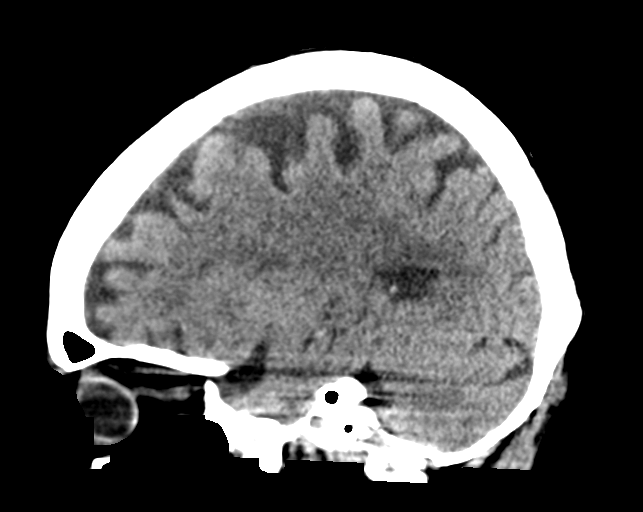

[15 of 47 positions shown; findings below may reference images not displayed]

FINDINGS: Brain: No evidence of parenchymal hemorrhage or extra-axial fluid
collection. No mass lesion, mass effect, or midline shift. No CT
evidence of acute infarction. Nonspecific mild subcortical and
periventricular white matter hypodensity, most in keeping with
chronic small vessel ischemic change. Cerebral volume is age
appropriate. No ventriculomegaly.

Vascular: No acute abnormality.

Skull: No evidence of calvarial fracture.

Sinuses/Orbits: The visualized paranasal sinuses are essentially
clear.

Other:  The mastoid air cells are unopacified.
IMPRESSION: 1. No evidence of acute intracranial abnormality. No evidence of
calvarial fracture.
2. Mild chronic small vessel ischemic changes in the cerebral white
matter.

## 2021-02-03 MED ORDER — PANTOPRAZOLE SODIUM 20 MG PO TBEC
20.0000 mg | DELAYED_RELEASE_TABLET | Freq: Every day | ORAL | Status: DC
  Filled 2021-02-03 (×4): qty 1

## 2021-02-03 MED ORDER — APIXABAN 5 MG PO TABS: 5.0000 mg | ORAL_TABLET | Freq: Two times a day (BID) | ORAL | Status: DC

## 2021-02-03 MED ORDER — RALOXIFENE HCL 60 MG PO TABS
60.0000 mg | ORAL_TABLET | Freq: Every day | ORAL | Status: DC
  Administered 2021-02-04 – 2021-02-05 (×2): 60 mg via ORAL
  Filled 2021-02-03 (×2): qty 1

## 2021-02-03 MED ORDER — STROKE: EARLY STAGES OF RECOVERY BOOK: Freq: Once | Status: AC

## 2021-02-03 MED ORDER — OMEGA-3-ACID ETHYL ESTERS 1 G PO CAPS
1.0000 g | ORAL_CAPSULE | Freq: Two times a day (BID) | ORAL | Status: DC
  Administered 2021-02-03 – 2021-02-05 (×3): 1 g via ORAL
  Filled 2021-02-03 (×4): qty 1

## 2021-02-03 MED ORDER — CARVEDILOL 3.125 MG PO TABS
6.2500 mg | ORAL_TABLET | Freq: Two times a day (BID) | ORAL | Status: DC
  Administered 2021-02-04 – 2021-02-05 (×3): 6.25 mg via ORAL
  Filled 2021-02-03 (×3): qty 2

## 2021-02-03 MED ORDER — ACETAMINOPHEN 650 MG RE SUPP: 650.0000 mg | RECTAL | Status: DC | PRN

## 2021-02-03 MED ORDER — APIXABAN 2.5 MG PO TABS
2.5000 mg | ORAL_TABLET | Freq: Two times a day (BID) | ORAL | Status: DC
  Administered 2021-02-03 – 2021-02-05 (×4): 2.5 mg via ORAL
  Filled 2021-02-03 (×4): qty 1

## 2021-02-03 MED ORDER — ADULT MULTIVITAMIN W/MINERALS CH
1.0000 | ORAL_TABLET | Freq: Every day | ORAL | Status: DC
  Administered 2021-02-04 – 2021-02-05 (×2): 1 via ORAL
  Filled 2021-02-03 (×2): qty 1

## 2021-02-03 MED ORDER — ACETAMINOPHEN 325 MG PO TABS
650.0000 mg | ORAL_TABLET | ORAL | Status: DC | PRN
  Administered 2021-02-04: 650 mg via ORAL
  Filled 2021-02-03: qty 2

## 2021-02-03 MED ORDER — ACETAMINOPHEN 160 MG/5ML PO SOLN: 650.0000 mg | ORAL | Status: DC | PRN

## 2021-02-03 NOTE — ED Provider Notes (Signed)
[facility] EMERGENCY DEPARTMENT Provider Note   CSN: [id number] Arrival date & time: 02/03/21  1537     History Chief Complaint  Patient presents with  . Near Syncope    Paige Ferguson is a 87 y.o. female.  HPI   This patient is a 87 year old female history of hypertension and hyperlipidemia, she has a history of atrial fibrillation and takes apixaban, she has hypertension and takes benazepril and amlodipine, she also takes Lasix.  She presents to the hospital today with a complaint of possibly passing out while she was getting a haircut at her house today, her daughter was cutting her hair and noticed that while she was getting her haircut she had her lips turned blue, she was unresponsive, moaning, she was trying to talk but the words were not coming out, she would not open her eyes, there did appear to be a tremor of her head but not her arms or legs.  This lasted about 30 minutes as well.  The medical record reflects that she was seen yesterday by the family doctor, there was a spell where the patient had been unresponsive while she was on the commode, there was an issue with not being able to speak, she was virtually unresponsive lasting about 30 minutes and then returned back to her baseline.  MRI has been ordered for the end of March, because of the situation today the daughter monitor to be evaluated soon as possible  Past Medical History:  Diagnosis Date  . Allergy   . Arthritis of knee   . Cataract   . Cholelithiases   . Chronic kidney disease   . Endometrial polyp 7/98   2 degree polyps   . Genu valgum   . Gout   . Hyperlipidemia   . Hypertension   . Left knee pain   . Osteoarthritis   . Osteoporosis   . Postmenopausal bleeding   . Recurrent UTI   . Renal abscess, right   . SCCA (squamous cell carcinoma) of skin 01/25/2021   Right Lower Back (in situ)  . SCCA (squamous cell carcinoma) of skin 01/25/2021   Left Flank (in situ)  . 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 Shoulder fracture, left   . TIA (transient ischemic attack)   . Ureteral calculi   . URI (upper respiratory infection)     Patient Active Problem List   Diagnosis Date Noted  . Ringworm 11/26/2020  . On home oxygen therapy 09/19/2019  . Atrial fibrillation ([place]) 06/26/2019  . CHF (congestive heart failure) ([place]) 06/26/2019  . Pressure injury of toe of left foot, stage 1 04/02/2018  . Obesity (BMI 30-39.9) 01/12/2017  . Metabolic syndrome 92/42/6834  . Fissure in ano 03/09/2014  . Hemorrhoid 03/09/2014  . TIA (transient ischemic attack) 12/23/2013  . Hyperlipidemia   . Renal abscess, right   . Osteoarthritis   . Postmenopausal bleeding   . Ureteral calculi   . Recurrent UTI   . Single kidney 06/05/2013  . HTN (hypertension) 02/28/2013  . Gout 02/28/2013  . Osteoporosis 02/28/2013  . Arthritis of knee, left 02/28/2013  . Genu valgum (acquired) 02/28/2013  . Endometrial polyp 05/20/1997    Past Surgical History:  Procedure Laterality Date  . EYE SURGERY    . HERNIA REPAIR    . HYSTEROSCOPY , FRACTIONAL D&C  7/98/  . [place]  . LEFT CATARACT SURGERY  10/99  . RIGHT CATARACT SURGERY  7/99  Dr. Dolores Lory   . RIGHT HEMINEPHRECTOMY FOR ABSCESS  2/98  . RIGHT NEPHRELITHOTOMY  1982     OB History   No obstetric history on file.     Family History  Problem Relation Age of Onset  . Stroke Maternal Grandmother   . Hypertension Maternal Grandmother   . Diabetes Maternal Grandmother   . Pneumonia Father   . Coronary artery disease Paternal Grandfather   . Heart defect Son     Social History   Tobacco Use  . Smoking status: Former Smoker    Types: Cigarettes    Quit date: 11/20/1944    Years since quitting: 76.2  . Smokeless tobacco: Never Used  Vaping Use  . Vaping Use: Never used  Substance Use Topics  . Alcohol use: No  . Drug use: No    Home Medications Prior to Admission medications   Medication Sig  Start Date End Date Taking? Authorizing Provider  amLODipine (NORVASC) 5 MG tablet Take 1 tablet (5 mg total) by mouth daily. 10/19/20   Sharion Balloon, FNP  apixaban (ELIQUIS) 5 MG TABS tablet Take 1 tablet (5 mg total) by mouth 2 (two) times daily. 10/19/20   Evelina Dun A, FNP  benazepril (LOTENSIN) 40 MG tablet Take 1 tablet (40 mg total) by mouth daily. 10/19/20   Sharion Balloon, FNP  Calcium-Magnesium-Vitamin D (CALCIUM 1200+D3 PO) Take by mouth.    [provider]  carvedilol (COREG) 6.25 MG tablet Take 1 tablet (6.25 mg total) by mouth 2 (two) times daily with a meal. 10/19/20   Hawks, Christy A, FNP  diphenhydrAMINE-zinc acetate (BENADRYL ITCH STOPPING) cream Apply topically 3 (three) times daily as needed for itching. 11/26/20   Ivy Lynn, NP  furosemide (LASIX) 40 MG tablet Take 1 tablet 2 times daily. (Needs to be seen before next refill) 01/20/21   Sharion Balloon, FNP  meloxicam (MOBIC) 7.5 MG tablet TAKE 1 TABLET DAILY 01/27/21   Sharion Balloon, FNP  Misc Natural Products (GNP GLUCOSAME CHONDROITIN DS) TABS Take by mouth.    [provider]  Multiple Vitamins-Minerals (CENTRUM SILVER ADULT 50+) TABS Take 1 tablet by mouth daily.    [provider]  mupirocin ointment (BACTROBAN) 2 % Apply 1 application topically 2 (two) times daily. 12/15/20   Sheffield, Ronalee Red, PA-C  Omega-3 Fatty Acids (FISH OIL) 1000 MG CAPS Take 1 capsule by mouth 2 (two) times daily.    [provider]  pantoprazole (PROTONIX) 20 MG tablet Take 1 tablet (20 mg total) by mouth daily. 12/03/20   [name], MD  raloxifene (EVISTA) 60 MG tablet Take 1 tablet (60 mg total) by mouth daily. 10/19/20   Evelina Dun A, FNP  terbinafine (LAMISIL AT) 1 % cream Apply 1 application topically 2 (two) times daily. 11/26/20   Ivy Lynn, NP  vitamin E 200 UNIT capsule Take 200 Units by mouth daily.    [provider]    Allergies    Sulfa antibiotics,  Amoxicillin, and Nitrofurantoin monohyd macro  Review of Systems   Review of Systems  All other systems reviewed and are negative.   Physical Exam Updated Vital Signs BP 118/85   Pulse 80   Temp 98.2 F (36.8 C) (Oral)   Resp (!) 29   Ht 1.422 m (4\' 8" )   Wt 59.3 kg   SpO2 100%   BMI 29.32 kg/m   Physical Exam Vitals and nursing note reviewed.  Constitutional:  General: She is not in acute distress.    Appearance: She is well-developed.  HENT:     Head: Normocephalic and atraumatic.     Mouth/Throat:     Pharynx: No oropharyngeal exudate.  Eyes:     General: No scleral icterus.       Right eye: No discharge.        Left eye: No discharge.     Conjunctiva/sclera: Conjunctivae normal.     Pupils: Pupils are equal, round, and reactive to light.  Neck:     Thyroid: No thyromegaly.     Vascular: No JVD.  Cardiovascular:     Rate and Rhythm: Normal rate. Rhythm irregular.     Heart sounds: Normal heart sounds. No murmur heard. No friction rub. No gallop.   Pulmonary:     Effort: Pulmonary effort is normal. No respiratory distress.     Breath sounds: Normal breath sounds. No wheezing or rales.  Abdominal:     General: Bowel sounds are normal. There is no distension.     Palpations: Abdomen is soft. There is no mass.     Tenderness: There is no abdominal tenderness.  Musculoskeletal:        General: No tenderness. Normal range of motion.     Cervical back: Normal range of motion and neck supple.  Lymphadenopathy:     Cervical: No cervical adenopathy.  Skin:    General: Skin is warm and dry.     Findings: No erythema or rash.  Neurological:     Mental Status: She is alert.     Coordination: Coordination normal.     Comments: The patient is able to move all 4 extremities, she has no facial droop or slurred speech, cranial nerves III through XII are normal, memory is completely intact, grips are normal bilaterally  Psychiatric:        Behavior: Behavior normal.      ED Results / Procedures / Treatments   Labs (all labs ordered are listed, but only abnormal results are displayed) Labs Reviewed  CBC WITH DIFFERENTIAL/PLATELET - Abnormal; Notable for the following components:      Result Value   RBC 3.79 (*)    MCV 101.3 (*)    Monocytes Absolute 1.2 (*)    All other components within normal limits  COMPREHENSIVE METABOLIC PANEL - Abnormal; Notable for the following components:   Chloride 95 (*)    CO2 34 (*)    Glucose, Bld 134 (*)    BUN 40 (*)    Creatinine, Ser 1.09 (*)    GFR, Estimated 46 (*)    All other components within normal limits  PROTIME-INR - Abnormal; Notable for the following components:   Prothrombin Time 16.6 (*)    INR 1.4 (*)    All other components within normal limits  URINE CULTURE  RESP PANEL BY RT-PCR (FLU A&B, COVID) ARPGX2  URINALYSIS, ROUTINE W REFLEX MICROSCOPIC  RAPID URINE DRUG SCREEN, HOSP PERFORMED    EKG EKG Interpretation  Date/Time:  Thursday February 03 2021 15:57:59 EDT Ventricular Rate:  78 PR Interval:    QRS Duration: 92 QT Interval:  367 QTC Calculation: 418 R Axis:   66 Text Interpretation: Atrial flutter with varied AV block, Low voltage, extremity and precordial leads Since last tracing rate slower Confirmed by Noemi Chapel [phone]) on 02/03/2021 4:33:23 PM   Radiology CT Head Wo Contrast  Result Date: 02/03/2021 CLINICAL DATA:  Syncope while getting hair cut. Recent TIA. Hypertension. EXAM:  CT HEAD WITHOUT CONTRAST TECHNIQUE: Contiguous axial images were obtained from the base of the skull through the vertex without intravenous contrast. COMPARISON:  None. FINDINGS: Brain: No evidence of parenchymal hemorrhage or extra-axial fluid collection. No mass lesion, mass effect, or midline shift. No CT evidence of acute infarction. Nonspecific mild subcortical and periventricular white matter hypodensity, most in keeping with chronic small vessel ischemic change. Cerebral volume is age  appropriate. No ventriculomegaly. Vascular: No acute abnormality. Skull: No evidence of calvarial fracture. Sinuses/Orbits: The visualized paranasal sinuses are essentially clear. Other:  The mastoid air cells are unopacified. IMPRESSION: 1. No evidence of acute intracranial abnormality. No evidence of calvarial fracture. 2. Mild chronic small vessel ischemic changes in the cerebral white matter. Electronically Signed   By: Ilona Sorrel M.D.   On: 02/03/2021 17:28    Procedures Procedures   Medications Ordered in ED Medications - No data to display  ED Course  I have reviewed the triage vital signs and the nursing notes.  Pertinent labs & imaging results that were available during my care of the patient were reviewed by me and considered in my medical decision making (see chart for details).    MDM Rules/Calculators/A&P                          At baseline the patient lives with her grandson, he is her full-time caregiver and she requires significant help to get around the house but is able to do this with assistance.  The daughter comes over to bathe her, feed her and does other tasks around the house to help.  She has been well until the last week when she has had 2 of these episodes, they seem similar, would consider seizures, possibly mini strokes or strokes, will order CT scan and labs and neurology consultation.  Neuro recommends MRI -  Don't change anticoagulation - at this time Stroke work up inpatient.  The patient has not had any further abnormal neurologic findings here I will discussed with the hospitalist for admission once the CT scan and labs have resulted  D/w with Dr. Landis Gandy - will admit   Final Clinical Impression(s) / ED Diagnoses Final diagnoses:  TIA (transient ischemic attack)      Noemi Chapel, MD 02/03/21 1739

## 2021-02-03 NOTE — Telephone Encounter (Signed)
-----   Message from Kelli R Sheffield, PA-C sent at 02/03/2021  8:01 AM EDT ----- 1 -30 excision 3,4  - 30 2 weeks after first surgery at suture removal 

## 2021-02-03 NOTE — Telephone Encounter (Signed)
Phone call to patient with her pathology results. Voicemail left for patient to give the office a call back.  

## 2021-02-03 NOTE — ED Triage Notes (Addendum)
Pt arrived Ems , daughter called EMSbecause pt passed out while she was cutting her hair.Daughter said pt had a TIA a few days ago and has an appt for MRI. Daughter would like pt checked out today.

## 2021-02-03 NOTE — H&P (Signed)
TRH H&P   Patient Demographics:    Paige Ferguson, is a 87 y.o. female  MRN: [id number]   DOB - Dec 15, 1920  Admit Date - 02/03/2021  Outpatient Primary MD for the patient is Sharion Balloon, FNP  Referring MD/NP/PA: Dr Sabra Heck  Patient coming from: Home  Chief Complaint  Patient presents with  . Near Syncope      HPI:    Paige Ferguson  is a 87 y.o. female, with past medical history of hypertension, hyperlipidemia, A. fib on apixaban, TIA, patient presents to ED secondary to complaints of near syncope/TIA, patient lives at home, grandson lives with her, patient presents to ED secondary to complaints of almost passing out today when she was having a heart cath, patient was getting a haircut, she was noted by her daughter that her lips turning blue, she was unresponsive, moaning, trying to talk but unable to verbalize, she was unable to open her eyes, she did appear to be having some tremors as well, episode lasted for around 30 minutes, as well daughter mentioned she had another similar episode earlier in the week at the bathroom, which prompted them to come to ED, patient currently denies any chest pain, shortness of breath, any focal deficits currently. -In ED her CT head with no acute findings, ED physician discussed with teleneurology, who recommended patient to be admitted for TIA work-up, I will recommended her to continue her Eliquis.    Review of systems:    In addition to the HPI above, No Fever-chills, No Headache, No changes with Vision or hearing, No problems swallowing food or Liquids, No Chest pain, Cough or Shortness of Breath, No Abdominal pain, No Nausea or Vommitting, Bowel movements are regular, No Blood in stool or Urine, No dysuria, No new skin rashes or bruises, No new joints pains-aches,  Patient had transient episodes of unresponsiveness, altered mental status,  and inappropriate speech. No recent weight gain or loss, No polyuria, polydypsia or polyphagia, No significant Mental Stressors.  A full 10 point Review of Systems was done, except as stated above, all other Review of Systems were negative.   With Past History of the following :    Past Medical History:  Diagnosis Date  . Allergy   . Arthritis of knee   . Cataract   . Cholelithiases   . Chronic kidney disease   . Endometrial polyp 7/98   2 degree polyps   . Genu valgum   . Gout   . Hyperlipidemia   . Hypertension   . Left knee pain   . Osteoarthritis   . Osteoporosis   . Postmenopausal bleeding   . Recurrent UTI   . Renal abscess, right   . SCCA (squamous cell carcinoma) of skin 01/25/2021   Right Lower Back (in situ)  . SCCA (squamous cell carcinoma) of skin 01/25/2021   Left Flank (in situ)  .  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 Shoulder fracture, left   . TIA (transient ischemic attack)   . Ureteral calculi   . URI (upper respiratory infection)       Past Surgical History:  Procedure Laterality Date  . EYE SURGERY    . HERNIA REPAIR    . HYSTEROSCOPY , FRACTIONAL D&C  7/98/  . [facility]  . LEFT CATARACT SURGERY  10/99  . RIGHT CATARACT SURGERY  7/99   Dr. Dolores Lory   . RIGHT HEMINEPHRECTOMY FOR ABSCESS  2/98  . RIGHT NEPHRELITHOTOMY  1982      Social History:     Social History   Tobacco Use  . Smoking status: Former Smoker    Types: Cigarettes    Quit date: 11/20/1944    Years since quitting: 76.2  . Smokeless tobacco: Never Used  Substance Use Topics  . Alcohol use: No       Family History :     Family History  Problem Relation Age of Onset  . Stroke Maternal Grandmother   . Hypertension Maternal Grandmother   . Diabetes Maternal Grandmother   . Pneumonia Father   . Coronary artery disease Paternal Grandfather   . Heart defect Son       Home Medications:   Prior to Admission medications    Medication Sig Start Date End Date Taking? Authorizing Provider  amLODipine (NORVASC) 5 MG tablet Take 1 tablet (5 mg total) by mouth daily. 10/19/20  Yes Hawks, Christy A, FNP  apixaban (ELIQUIS) 5 MG TABS tablet Take 1 tablet (5 mg total) by mouth 2 (two) times daily. 10/19/20  Yes Hawks, Christy A, FNP  benazepril (LOTENSIN) 40 MG tablet Take 1 tablet (40 mg total) by mouth daily. 10/19/20  Yes Hawks, Christy A, FNP  Calcium-Magnesium-Vitamin D (CALCIUM 1200+D3 PO) Take 1 tablet by mouth daily.   Yes [provider]  carvedilol (COREG) 6.25 MG tablet Take 1 tablet (6.25 mg total) by mouth 2 (two) times daily with a meal. 10/19/20  Yes Hawks, Christy A, FNP  diphenhydrAMINE-zinc acetate (BENADRYL ITCH STOPPING) cream Apply topically 3 (three) times daily as needed for itching. 11/26/20  Yes Ivy Lynn, NP  furosemide (LASIX) 40 MG tablet Take 1 tablet 2 times daily. (Needs to be seen before next refill) Patient taking differently: Take 40 mg by mouth 2 (two) times daily. 01/20/21  Yes Hawks, Christy A, FNP  meloxicam (MOBIC) 7.5 MG tablet TAKE 1 TABLET DAILY Patient taking differently: Take 7.5 mg by mouth daily. 01/27/21  Yes Hawks, Theador Hawthorne, FNP  Misc Natural Products (GNP GLUCOSAME CHONDROITIN DS) TABS Take 1 tablet by mouth daily.   Yes [provider]  Multiple Vitamins-Minerals (CENTRUM SILVER ADULT 50+) TABS Take 1 tablet by mouth daily.   Yes [provider]  mupirocin ointment (BACTROBAN) 2 % Apply 1 application topically 2 (two) times daily. 12/15/20  Yes Sheffield, Kelli R, PA-C  Omega-3 Fatty Acids (FISH OIL) 1000 MG CAPS Take 1 capsule by mouth 2 (two) times daily.   Yes [provider]  pantoprazole (PROTONIX) 20 MG tablet Take 1 tablet (20 mg total) by mouth daily. 12/03/20  Yes [name], MD  raloxifene (EVISTA) 60 MG tablet Take 1 tablet (60 mg total) by mouth daily. 10/19/20  Yes Hawks, Christy A, FNP  terbinafine (LAMISIL AT) 1 % cream  Apply 1 application topically 2 (two) times daily. 11/26/20  Yes Ivy Lynn,  NP  vitamin E 200 UNIT capsule Take 200 Units by mouth daily.   Yes [provider]     Allergies:     Allergies  Allergen Reactions  . Sulfa Antibiotics Nausea And Vomiting  . Amoxicillin Other (See Comments)    Unknown reaction  . Nitrofurantoin Monohyd Macro Other (See Comments)    Unknown reaction     Physical Exam:   Vitals  Blood pressure 123/82, pulse 79, temperature 98.2 F (36.8 C), temperature source Oral, resp. rate (!) 22, height 4\' 8"  (1.422 m), weight 59.3 kg, SpO2 100 %.   1. General frail elderly female, laying in bed, no apparent distress  2. Normal affect and insight, Not Suicidal or Homicidal, Awake Alert, Oriented X 3.  3. No F.N deficits, ALL C.Nerves Intact, Strength 5/5 all 4 extremities, Sensation intact all 4 extremities, Plantars down going.  4. Ears and Eyes appear Normal, Conjunctivae clear, PERRLA. Moist Oral Mucosa.  5. Supple Neck, No JVD, No cervical lymphadenopathy appriciated, No Carotid Bruits.  6. Symmetrical Chest wall movement, Good air movement bilaterally, CTAB.  7. IRR IRR No Gallops, Rubs or Murmurs, No Parasternal Heave.  8. Positive Bowel Sounds, Abdomen Soft, No tenderness, No organomegaly appriciated,No rebound -guarding or rigidity.  9.  No Cyanosis, Normal Skin Turgor, No Skin Rash or Bruise.  10. Good muscle tone,  joints appear normal , no effusions, Normal ROM.  11. No Palpable Lymph Nodes in Neck or Axillae    Data Review:    CBC Recent Labs  Lab 02/02/21 1602 02/03/21 1609  WBC 6.9 6.6  HGB 11.5 12.2  HCT 34.2 38.4  PLT 235 241  MCV 98* 101.3*  MCH 33.0 32.2  MCHC 33.6 31.8  RDW 12.8 13.2  LYMPHSABS 2.3 2.1  MONOABS  --  1.2*  EOSABS 0.2 0.2  BASOSABS 0.0 0.0   ------------------------------------------------------------------------------------------------------------------  Chemistries  Recent Labs  Lab  02/02/21 1602 02/03/21 1609  NA 140 136  K 4.2 4.2  CL 96 95*  CO2 31* 34*  GLUCOSE 101* 134*  BUN 29 40*  CREATININE 0.92 1.09*  CALCIUM 9.2 9.2  AST 25 24  ALT 15 17  ALKPHOS 70 58  BILITOT 0.5 0.7   ------------------------------------------------------------------------------------------------------------------ estimated creatinine clearance is 21.2 mL/min (A) (by C-G formula based on SCr of 1.09 mg/dL (H)). ------------------------------------------------------------------------------------------------------------------ No results for input(s): TSH, T4TOTAL, T3FREE, THYROIDAB in the last 72 hours.  Invalid input(s): FREET3  Coagulation profile Recent Labs  Lab 02/03/21 1609  INR 1.4*   ------------------------------------------------------------------------------------------------------------------- No results for input(s): DDIMER in the last 72 hours. -------------------------------------------------------------------------------------------------------------------  Cardiac Enzymes No results for input(s): CKMB, TROPONINI, MYOGLOBIN in the last 168 hours.  Invalid input(s): CK ------------------------------------------------------------------------------------------------------------------    Component Value Date/Time   BNP 165.7 (H) 05/08/2019 1238     ---------------------------------------------------------------------------------------------------------------  Urinalysis    Component Value Date/Time   APPEARANCEUR Hazy (A) 06/26/2019 1618   GLUCOSEU Negative 06/26/2019 1618   BILIRUBINUR Negative 06/26/2019 1618   PROTEINUR Negative 06/26/2019 1618   UROBILINOGEN negative 04/28/2014 1013   NITRITE Negative 06/26/2019 1618   LEUKOCYTESUR Negative 06/26/2019 1618    ----------------------------------------------------------------------------------------------------------------   Imaging Results:    CT Head Wo Contrast  Result Date:  02/03/2021 CLINICAL DATA:  Syncope while getting hair cut. Recent TIA. Hypertension. EXAM: CT HEAD WITHOUT CONTRAST TECHNIQUE: Contiguous axial images were obtained from the base of the skull through the vertex without intravenous contrast. COMPARISON:  None. FINDINGS: Brain: No evidence of parenchymal hemorrhage or  extra-axial fluid collection. No mass lesion, mass effect, or midline shift. No CT evidence of acute infarction. Nonspecific mild subcortical and periventricular white matter hypodensity, most in keeping with chronic small vessel ischemic change. Cerebral volume is age appropriate. No ventriculomegaly. Vascular: No acute abnormality. Skull: No evidence of calvarial fracture. Sinuses/Orbits: The visualized paranasal sinuses are essentially clear. Other:  The mastoid air cells are unopacified. IMPRESSION: 1. No evidence of acute intracranial abnormality. No evidence of calvarial fracture. 2. Mild chronic small vessel ischemic changes in the cerebral white matter. Electronically Signed   By: Ilona Sorrel M.D.   On: 02/03/2021 17:28    My personal review of EKG: Rhythm A flutter  Rate  78 /min, QTc 418 , no Acute ST changes   Assessment & Plan:    Active Problems:   HTN (hypertension)   Hyperlipidemia   TIA (transient ischemic attack)   Atrial fibrillation (HCC)   Transient focal deficits -Near syncope versus TIA -He is admitted under TIA pathway, will obtain MRI brain, MRI head and neck, 2D echo, monitor on telemetry, will check lipid panel, will check A1c, will consult PT/OT, will continue her on Eliquis. -Near-syncope is a possibility as well, please see discussion below under hypertension, check orthostatics.  HTN -Blood pressure acceptable to soft, will hold Norvasc, Lasix and benazepril, will continue with Coreg.  History of A. Fib -Continue with Coreg for heart rate control, continue with Eliquis for anticoagulation  GERD -Continue with PPI  Hyperlipidemia -Continue with  fish oil  DVT Prophylaxis Heparin  AM Labs Ordered, also please review Full Orders  Family Communication: Admission, patients condition and plan of care including tests being ordered have been discussed with the patient and daughter at bedside* who indicate understanding and agree with the plan and Code Status.  Code Status DNR  Likely DC to  home  Condition GUARDED    Consults called: ED D/W tele neuro    Admission status: observation    Time spent in minutes : 55 minutes   Phillips Climes M.D on 02/03/2021 at 7:01 PM   [facility] - Office  [phone]

## 2021-02-04 ENCOUNTER — Observation Stay (HOSPITAL_COMMUNITY): Payer: Medicare Other

## 2021-02-04 ENCOUNTER — Other Ambulatory Visit (HOSPITAL_COMMUNITY): Payer: Medicare Other

## 2021-02-04 ENCOUNTER — Observation Stay (HOSPITAL_BASED_OUTPATIENT_CLINIC_OR_DEPARTMENT_OTHER): Payer: Medicare Other

## 2021-02-04 ENCOUNTER — Encounter (HOSPITAL_COMMUNITY): Payer: Self-pay | Admitting: Internal Medicine

## 2021-02-04 DIAGNOSIS — I4891 Unspecified atrial fibrillation: Secondary | ICD-10-CM | POA: Diagnosis not present

## 2021-02-04 DIAGNOSIS — G459 Transient cerebral ischemic attack, unspecified: Secondary | ICD-10-CM | POA: Diagnosis not present

## 2021-02-04 DIAGNOSIS — I10 Essential (primary) hypertension: Secondary | ICD-10-CM | POA: Diagnosis not present

## 2021-02-04 DIAGNOSIS — I6623 Occlusion and stenosis of bilateral posterior cerebral arteries: Secondary | ICD-10-CM | POA: Diagnosis not present

## 2021-02-04 DIAGNOSIS — R569 Unspecified convulsions: Secondary | ICD-10-CM | POA: Diagnosis not present

## 2021-02-04 DIAGNOSIS — I6603 Occlusion and stenosis of bilateral middle cerebral arteries: Secondary | ICD-10-CM | POA: Diagnosis not present

## 2021-02-04 DIAGNOSIS — R55 Syncope and collapse: Secondary | ICD-10-CM | POA: Diagnosis not present

## 2021-02-04 DIAGNOSIS — E782 Mixed hyperlipidemia: Secondary | ICD-10-CM

## 2021-02-04 LAB — ECHOCARDIOGRAM COMPLETE
Height: 56 in
S' Lateral: 2.4 cm
Weight: 1971.79 oz

## 2021-02-04 LAB — COMPREHENSIVE METABOLIC PANEL
ALT: 13 U/L (ref 0–44)
AST: 21 U/L (ref 15–41)
Albumin: 3 g/dL — ABNORMAL LOW (ref 3.5–5.0)
Alkaline Phosphatase: 51 U/L (ref 38–126)
Anion gap: 9 (ref 5–15)
BUN: 34 mg/dL — ABNORMAL HIGH (ref 8–23)
CO2: 33 mmol/L — ABNORMAL HIGH (ref 22–32)
Calcium: 8.9 mg/dL (ref 8.9–10.3)
Chloride: 98 mmol/L (ref 98–111)
Creatinine, Ser: 0.77 mg/dL (ref 0.44–1.00)
GFR, Estimated: >60 mL/min (ref >60)
Glucose, Bld: 90 mg/dL (ref 70–99)
Potassium: 3.6 mmol/L (ref 3.5–5.1)
Sodium: 140 mmol/L (ref 135–145)
Total Bilirubin: 0.8 mg/dL (ref 0.3–1.2)
Total Protein: 6.2 g/dL — ABNORMAL LOW (ref 6.5–8.1)

## 2021-02-04 LAB — CBC
HCT: 36.8 % (ref 36.0–46.0)
Hemoglobin: 11.8 g/dL — ABNORMAL LOW (ref 12.0–15.0)
MCH: 32.5 pg (ref 26.0–34.0)
MCHC: 32.1 g/dL (ref 30.0–36.0)
MCV: 101.4 fL — ABNORMAL HIGH (ref 80.0–100.0)
Platelets: 223 10*3/uL (ref 150–400)
RBC: 3.63 MIL/uL — ABNORMAL LOW (ref 3.87–5.11)
RDW: 13.2 % (ref 11.5–15.5)
WBC: 6.6 10*3/uL (ref 4.0–10.5)
nRBC: 0 % (ref 0.0–0.2)

## 2021-02-04 LAB — LIPID PANEL
Cholesterol: 126 mg/dL (ref 0–200)
HDL: 37 mg/dL — ABNORMAL LOW (ref >40)
LDL Cholesterol: 80 mg/dL (ref 0–99)
Total CHOL/HDL Ratio: 3.4 RATIO
Triglycerides: 47 mg/dL (ref <150)
VLDL: 9 mg/dL (ref 0–40)

## 2021-02-04 LAB — HEMOGLOBIN A1C
Hgb A1c MFr Bld: 5.5 % (ref 4.8–5.6)
Mean Plasma Glucose: 111.15 mg/dL

## 2021-02-04 IMAGING — MR MR HEAD W/O CM
9 of 10 series · 40 of 48 positions shown · non-contrast
Comparison: Head CT [DATE].

INDICATION: Transient ischemic attack (TIA).

EXAM:
MRI HEAD WITHOUT CONTRAST
TECHNIQUE: Multiplanar, multiecho pulse sequences of the brain and surrounding
structures were obtained without intravenous contrast.

[Series 5: DWI · axial · 4.0mm · 0.88mm/px · z∈[-69,+71]mm · 5 of 36 slices shown (1 of 4)]
[im 1/36]
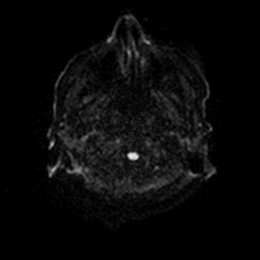
[im 9/36]
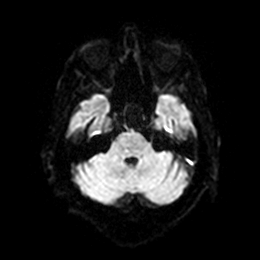
[im 18/36]
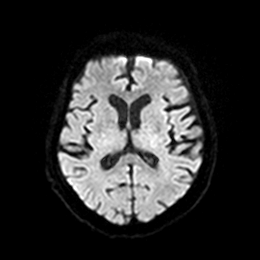
[im 27/36]
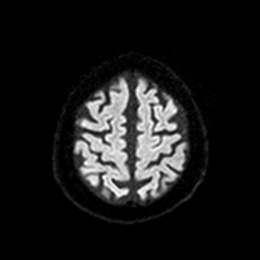
[im 36/36]
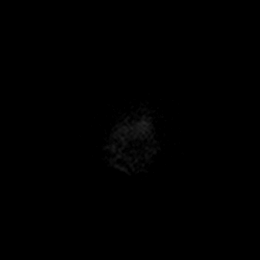

[Series 6: DWI · axial · 4.0mm · 0.88mm/px · z∈[-69,+71]mm · 5 of 36 slices shown (2 of 4)]
[im 1/36]
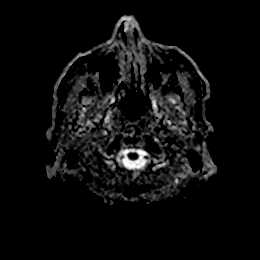
[im 9/36]
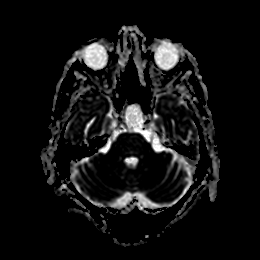
[im 18/36]
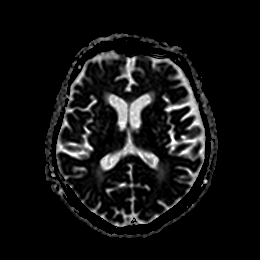
[im 27/36]
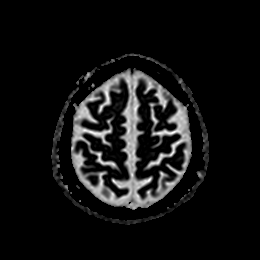
[im 36/36]
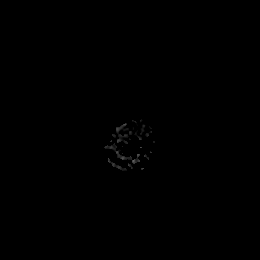

[Series 7: DWI · coronal · 5.0mm · 0.88mm/px · 5 of 30 slices shown (3 of 4)]
[im 1/30]
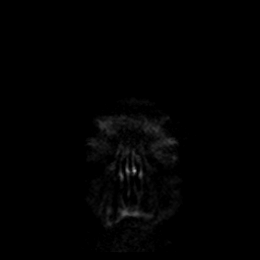
[im 8/30]
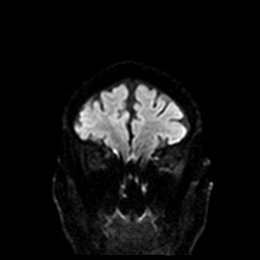
[im 15/30]
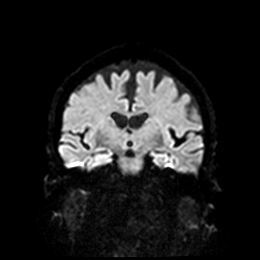
[im 22/30]
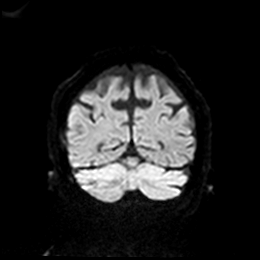
[im 30/30]
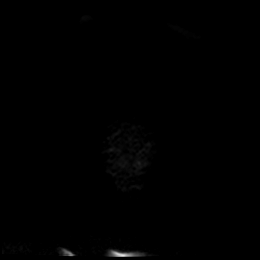

[Series 8: DWI · coronal · 5.0mm · 0.88mm/px · 5 of 30 slices shown (4 of 4)]
[im 1/30]
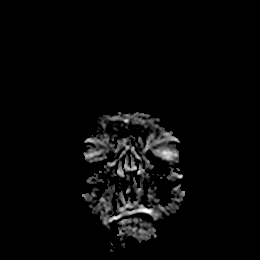
[im 8/30]
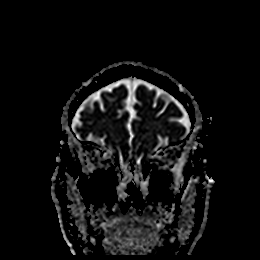
[im 15/30]
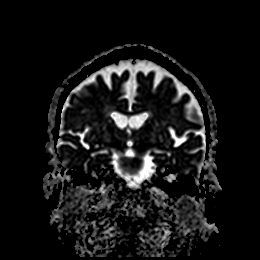
[im 22/30]
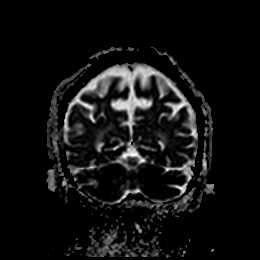
[im 30/30]
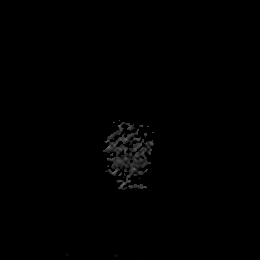

[Series 9: T2 · axial · 5.0mm · 0.72mm/px · z∈[-75,+71]mm · 3 of 22 slices shown (1 of 2)]
[im 1/22]
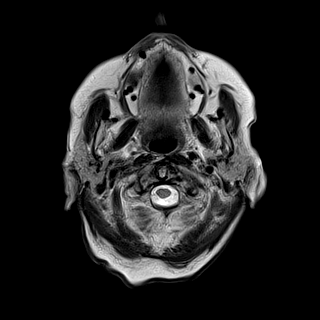
[im 11/22]
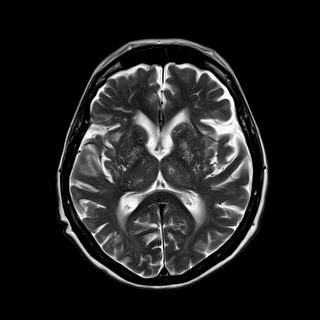
[im 22/22]
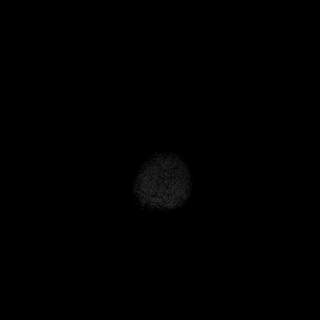

[Series 10: ax hemo · axial · 5.0mm · 0.86mm/px · z∈[-72,+71]mm · 4 of 25 slices shown]
[im 1/25]
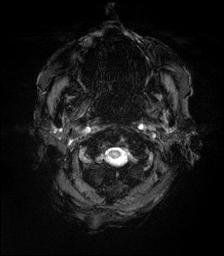
[im 9/25]
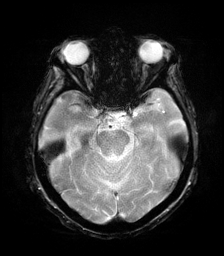
[im 17/25]
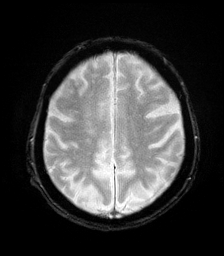
[im 25/25]
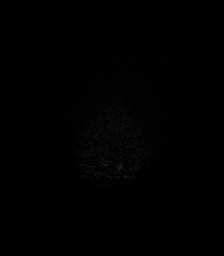

[Series 11: FLAIR · axial · 4.0mm · 0.43mm/px · z∈[-70,+69]mm · 5 of 36 slices shown]
[im 1/36]
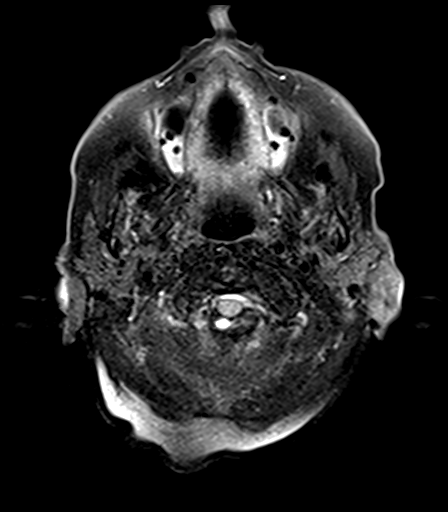
[im 9/36]
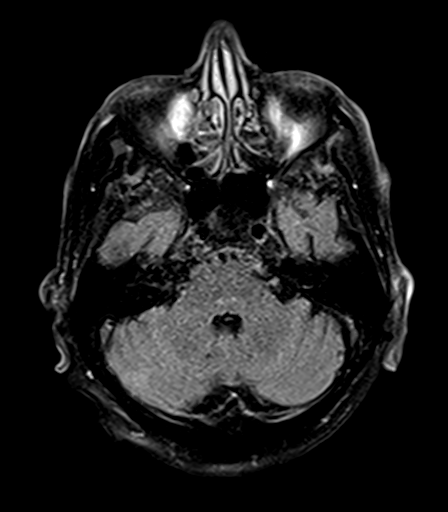
[im 18/36]
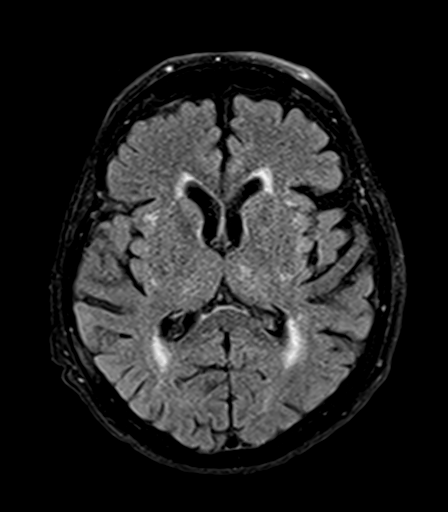
[im 27/36]
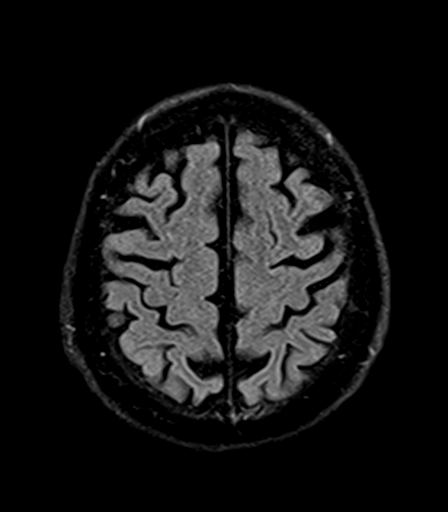
[im 36/36]
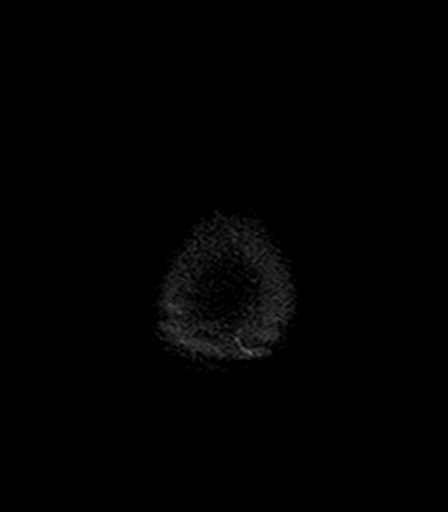

[Series 13: T2 · coronal · 5.0mm · 0.72mm/px · 5 of 30 slices shown (2 of 2)]
[im 1/30]
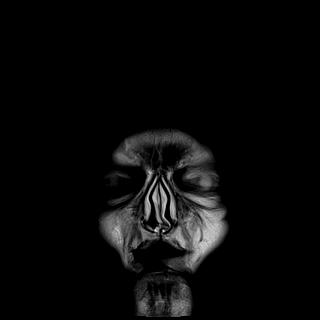
[im 8/30]
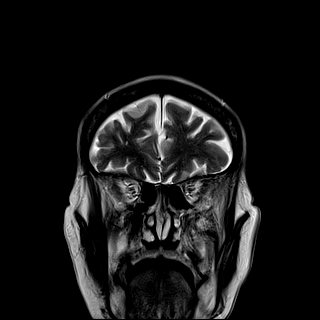
[im 15/30]
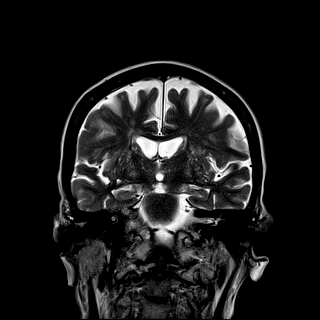
[im 22/30]
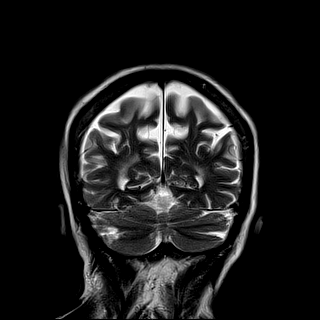
[im 30/30]
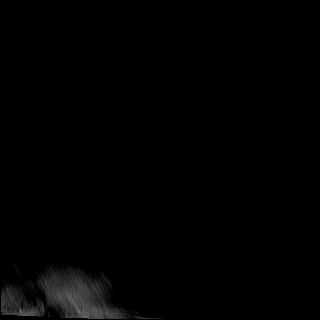

[Series 100: T1 · sagittal · 5.0mm · 0.94mm/px · 3 of 21 slices shown]
[im 1/21]
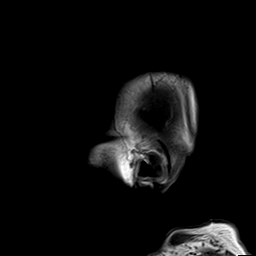
[im 11/21]
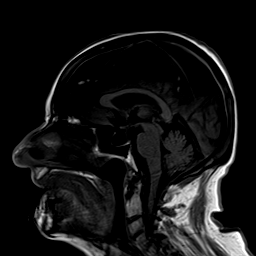
[im 21/21]
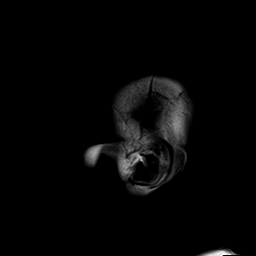

[40 of 48 positions shown; findings below may reference images not displayed]

FINDINGS: Brain:

Mild intermittent motion degradation.

Mild for age cerebral and cerebellar atrophy.

Mild-to-moderate multifocal T2/FLAIR hyperintensity within the
cerebral white matter is nonspecific, but compatible with chronic
small vessel ischemic disease. Chronic small vessel ischemic changes
are also present within the deep gray nuclei and pons.

Redemonstrated small chronic infarct within the right cerebellar
hemisphere.

There is no acute infarct.

No evidence of intracranial mass.

No chronic intracranial blood products.

No extra-axial fluid collection.

No midline shift.

Partially empty and slightly expanded sella turcica.

Vascular: Expected proximal arterial flow voids.

Skull and upper cervical spine: No focal marrow lesion. Incompletely
assessed upper cervical spondylosis.

Sinuses/Orbits: Visualized orbits show no acute finding. Trace
bilateral ethmoid sinus mucosal thickening.
IMPRESSION: No evidence of acute intracranial abnormality, including acute
infarction.

Mild-to-moderate cerebral white matter chronic small vessel ischemic
disease. Additional chronic small vessel ischemic changes within the
deep gray nuclei and pons.

Redemonstrated small chronic right cerebellar infarct.

Mild for age generalized parenchymal atrophy.

## 2021-02-04 IMAGING — MR MR MRA HEAD W/O CM
1 series · 19 of 48 positions shown · non-contrast
Comparison: Concurrently performed brain MRI [DATE]. Head CT
[DATE].

CLINICAL DATA: Transient ischemic attack (TIA).

EXAM:
MRA HEAD WITHOUT CONTRAST
TECHNIQUE: Angiographic images of the Circle of Willis were obtained using MRA
technique without intravenous contrast.

[Series 5: TOF · axial · 0.4mm · 0.42mm/px · z∈[-70,+18]mm · 19 of 232 slices shown]
[im 1/232]
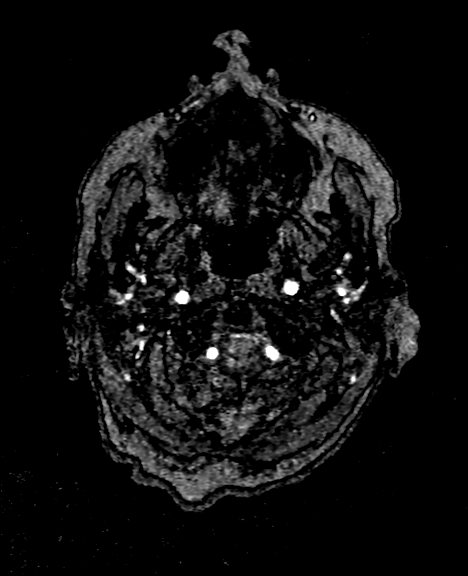
[im 5/232]
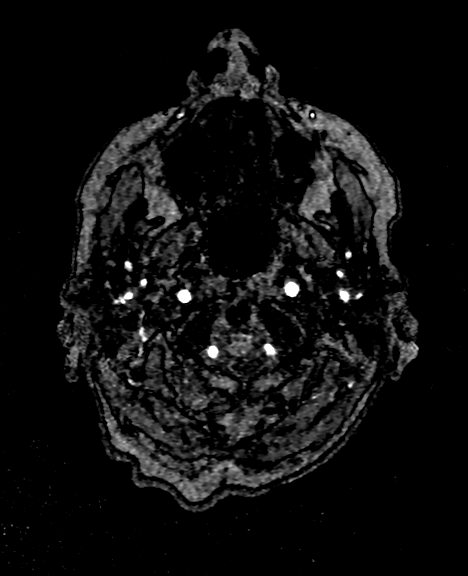
[im 10/232]
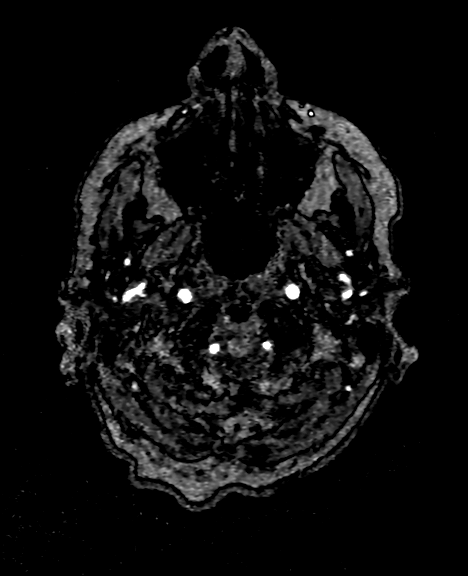
[im 15/232]
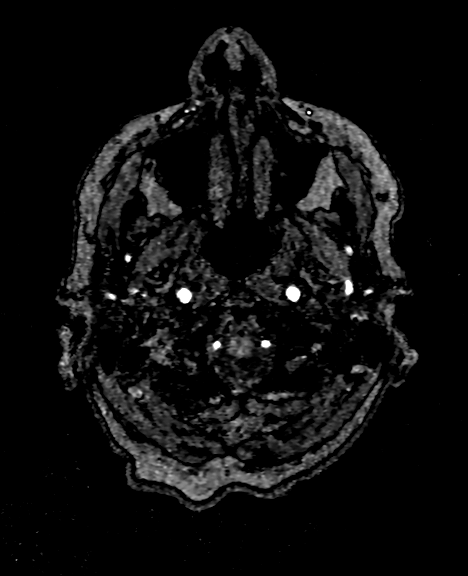
[im 20/232]
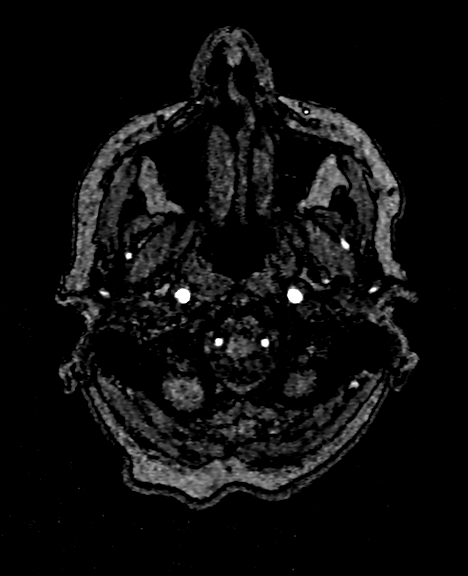
[im 25/232]
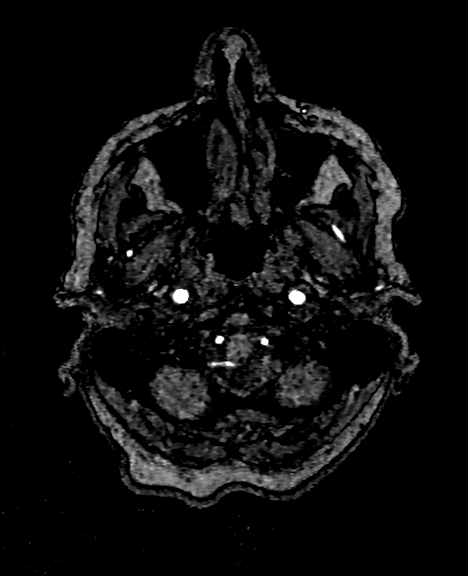
[im 30/232]
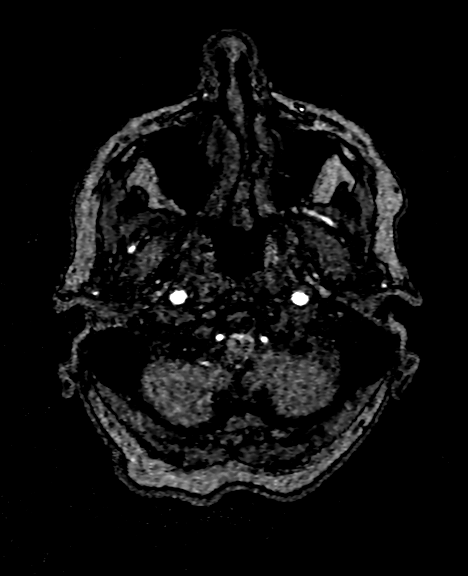
[im 35/232]
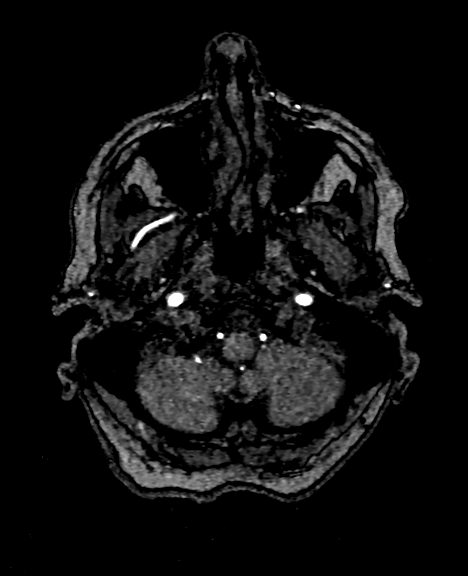
[im 40/232]
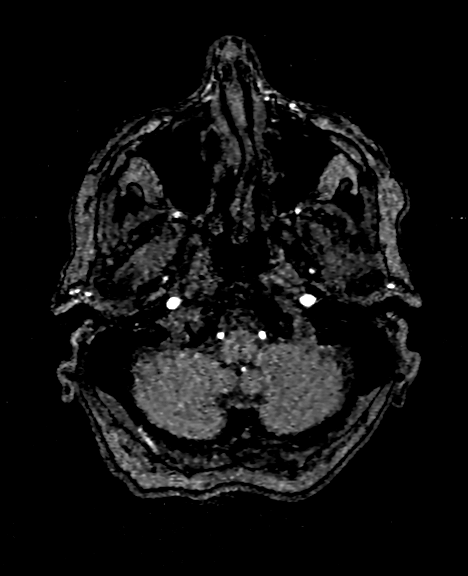
[im 45/232]
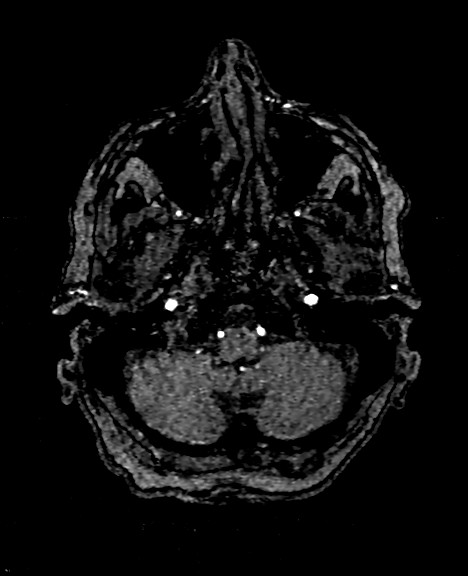
[im 50/232]
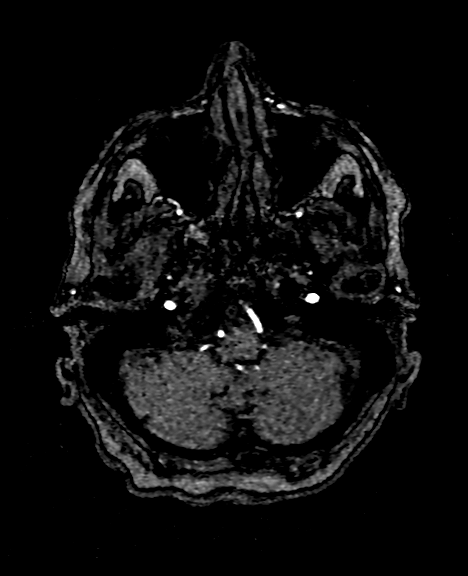
[im 74/232]
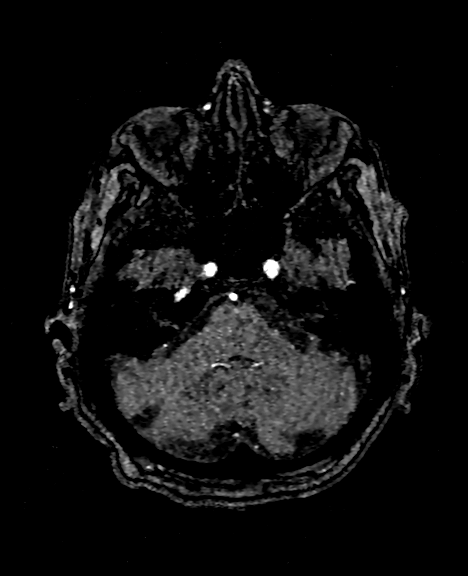
[im 104/232]
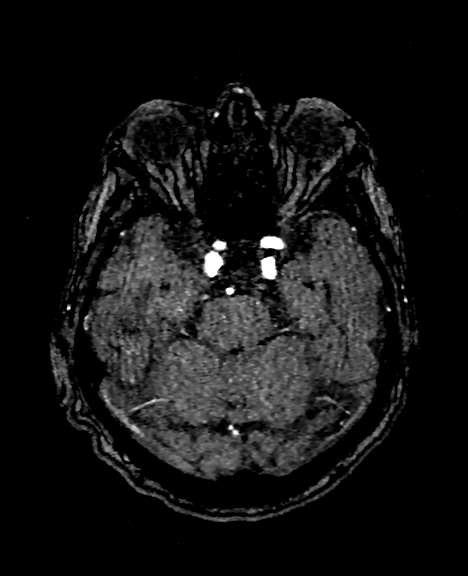
[im 118/232]
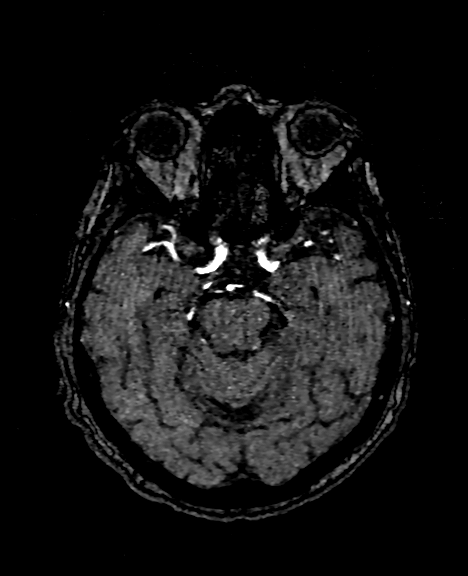
[im 133/232]
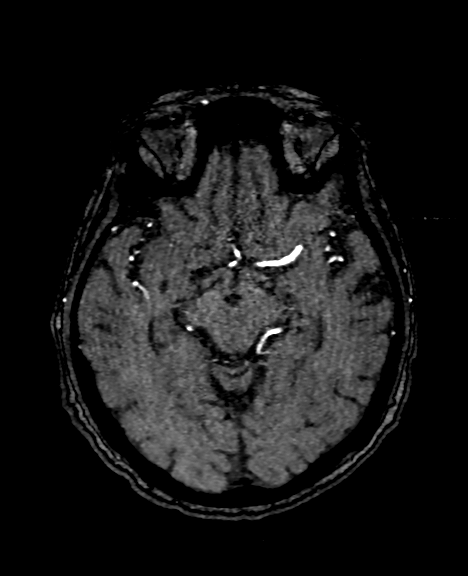
[im 163/232]
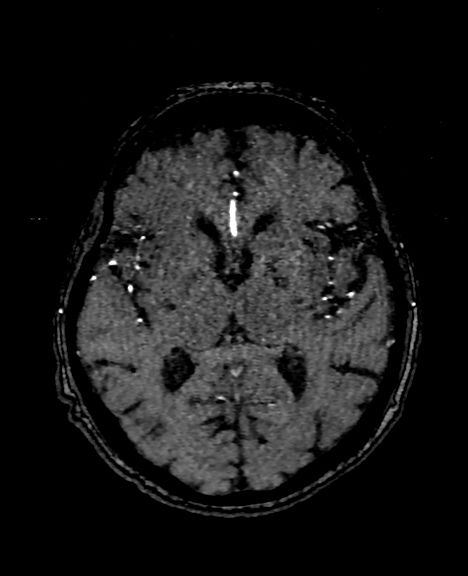
[im 192/232]
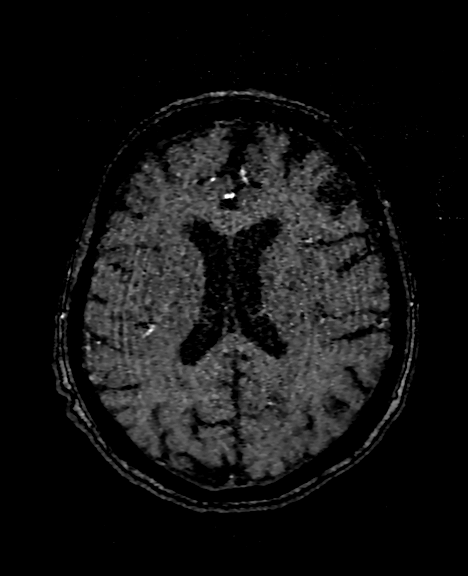
[im 197/232]
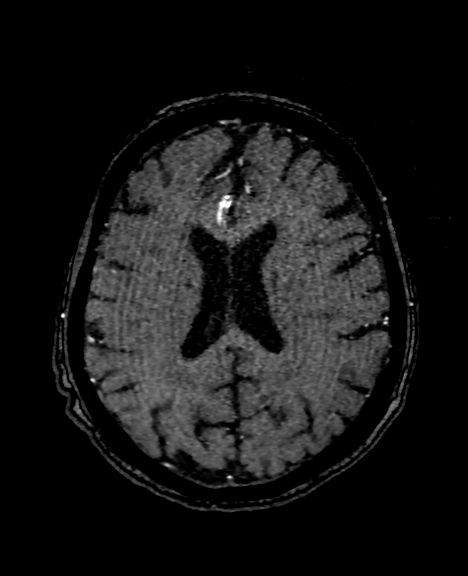
[im 222/232]
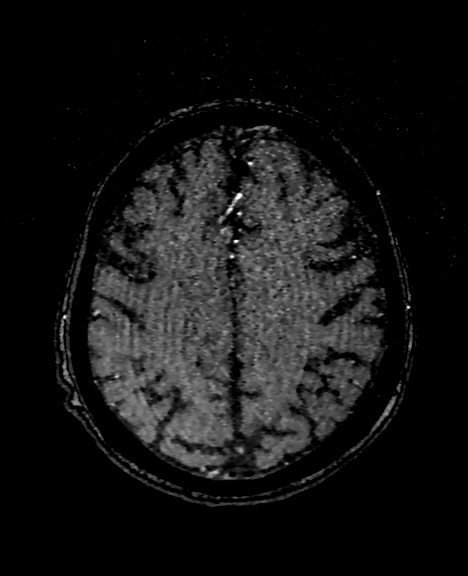

[19 of 48 positions shown; findings below may reference images not displayed]

FINDINGS: The intracranial internal carotid arteries are patent. The M1 middle
cerebral arteries are patent. No proximal M2 branch occlusion.
Apparent moderate to moderately severe stenosis within a proximal M2
right MCA branch vessel. Additionally, there are sites of apparent
moderate stenosis within the proximal left M2 MCA branch vessels.
However, these apparent stenoses are not appreciated on the
concurrently performed contrast-enhanced MRA of the neck (which
includes portions of the intracranial arterial vasculature), and may
be exaggerated by artifact on this non-contrast exam. The anterior
cerebral arteries are patent.

The intracranial vertebral arteries are patent. The basilar artery
is patent. Severe focal stenosis within the distal right PICA
(series [H0], image 10). The posterior cerebral arteries are patent.
Apparent severe segmental stenosis within the P2 left PCA. A left
posterior communicating artery is present. The right posterior
communicating artery is hypoplastic or absent.

No intracranial aneurysm is identified.
IMPRESSION: No intracranial large vessel occlusion.

Intracranial atherosclerotic disease multifocal stenoses, most
notably as follows.

Apparent moderate to moderately severe stenosis within a proximal M2
right MCA branch vessel. Additionally, there are sites of apparent
moderate stenosis within the proximal left M2 MCA branch vessels.
However, these apparent stenoses are not appreciated on the
concurrently performed contrast-enhanced MRA of the neck (which
includes portions of the intracranial arterial vasculature), and may
be exaggerated by artifact on this non-contrast exam.

Apparent severe segmental stenosis within the P2 segment of the left
posterior cerebral artery. This stenosis could also potentially be
exaggerated by artifact on the current exam.

Severe focal stenosis within the distal right PICA.

## 2021-02-04 IMAGING — MR MR MRA NECK WO/W CM
2 series · 41 of 48 positions shown · IV contrast (5 ml Gadavist)
Comparison: No pertinent prior exams available for comparison.

CLINICAL DATA: TIA (transient ischemic attack).

EXAM:
MRA NECK WITHOUT AND WITH CONTRAST
TECHNIQUE: Multiplanar and multiecho pulse sequences of the neck were obtained
without and with intravenous contrast. Angiographic images of the
neck were obtained using MRA technique without and with intravenous
contrast.
CONTRAST:  5mL GADAVIST GADOBUTROL 1 MMOL/ML IV SOLN

[Series 11: tof_fl3d_tra_iso · axial · 0.6mm · 0.52mm/px · z∈[-137,-59]mm · 30 of 133 slices shown]
[im 1/133]
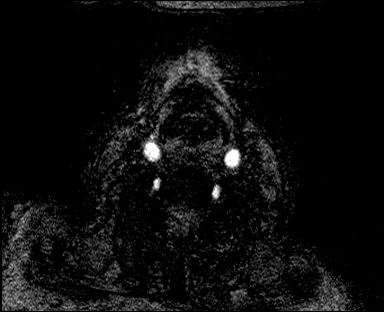
[im 5/133]
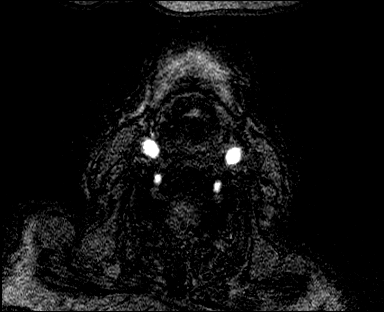
[im 10/133]
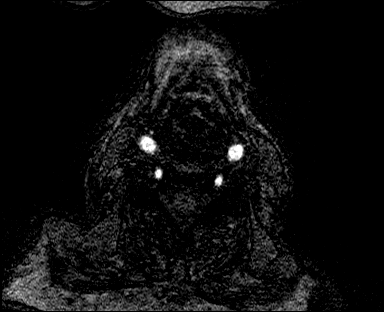
[im 14/133]
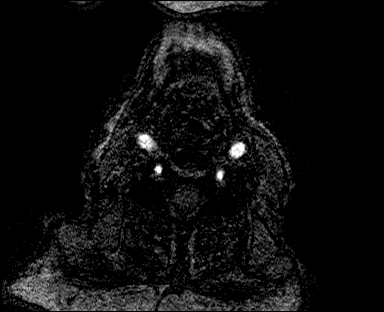
[im 19/133]
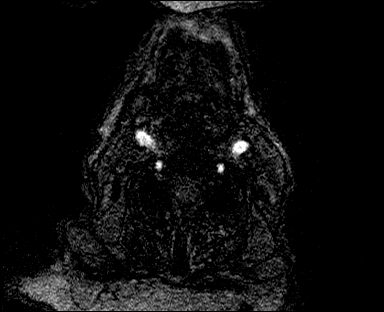
[im 23/133]
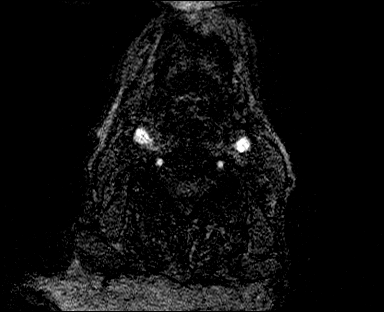
[im 28/133]
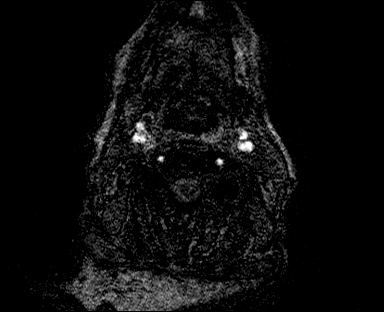
[im 32/133]
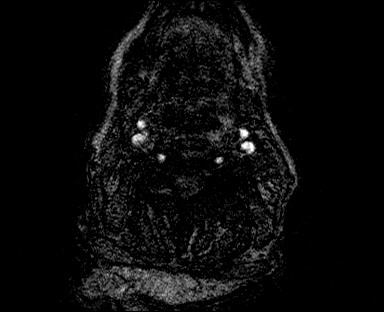
[im 37/133]
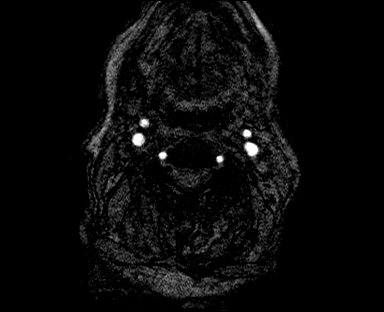
[im 41/133]
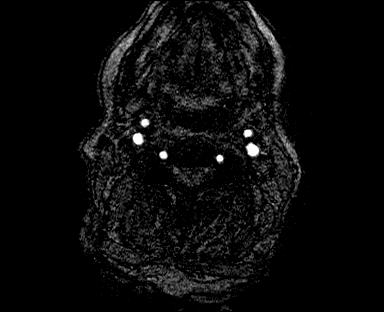
[im 46/133]
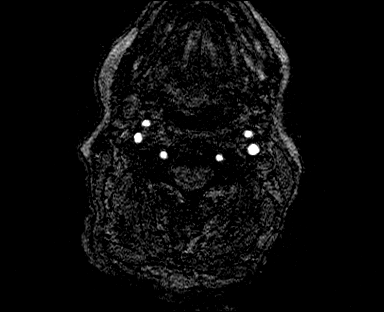
[im 51/133]
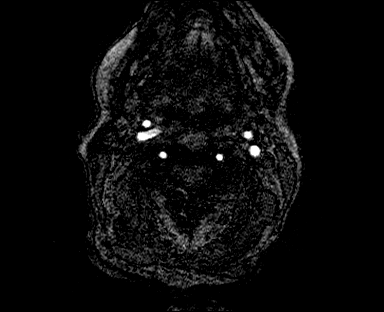
[im 55/133]
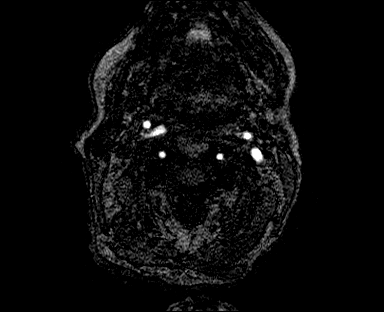
[im 60/133]
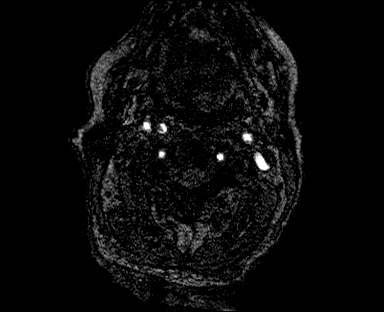
[im 64/133]
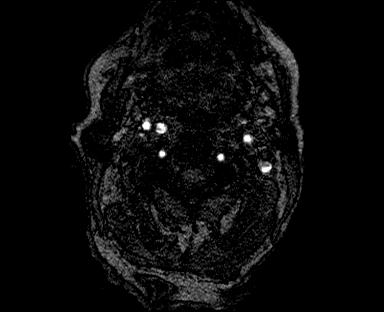
[im 69/133]
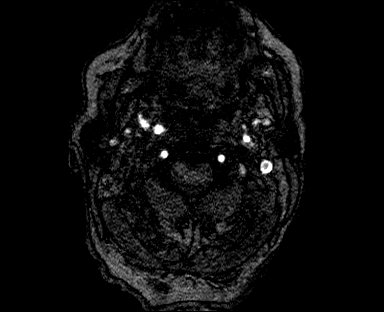
[im 73/133]
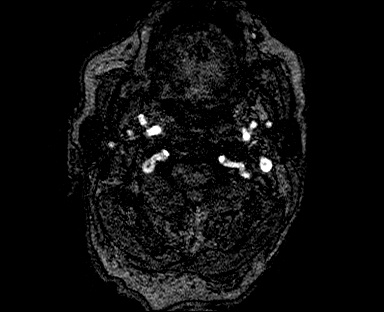
[im 78/133]
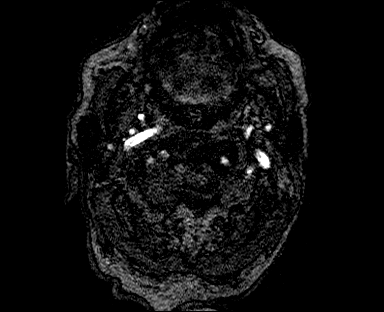
[im 82/133]
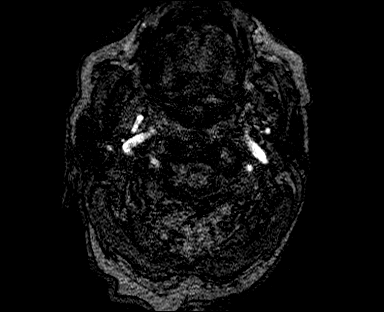
[im 87/133]
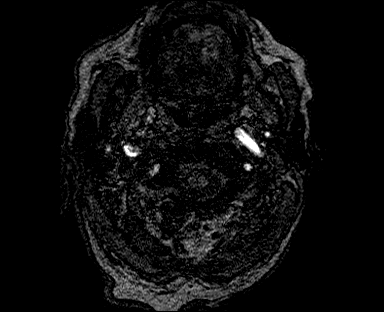
[im 92/133]
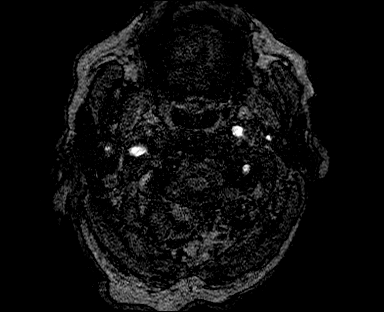
[im 96/133]
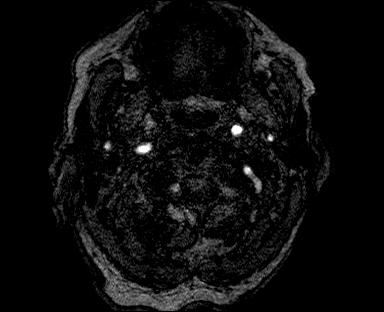
[im 101/133]
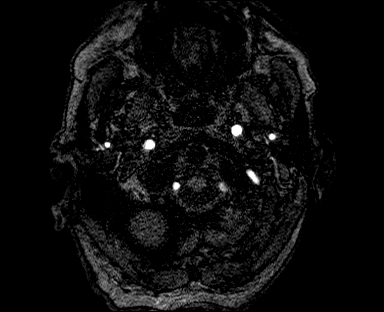
[im 105/133]
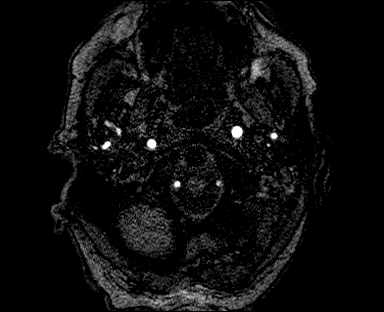
[im 110/133]
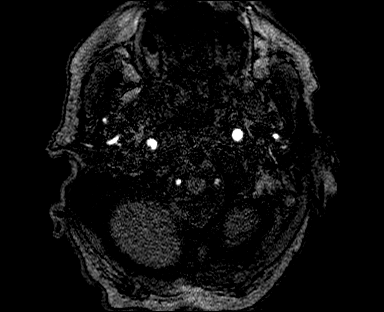
[im 114/133]
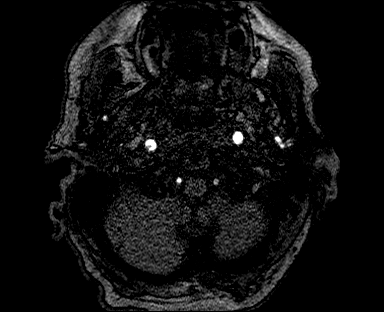
[im 119/133]
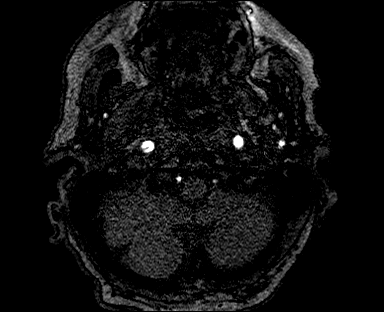
[im 123/133]
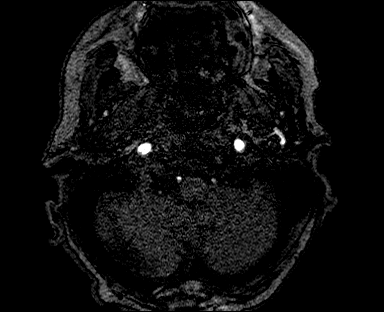
[im 128/133]
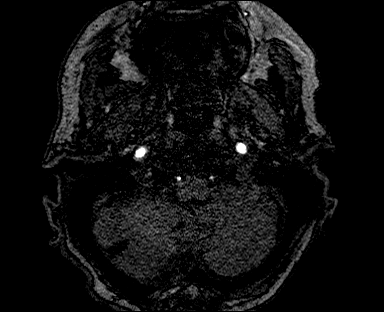
[im 133/133]
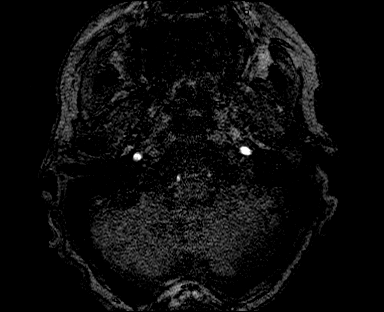

[Series 16: angio_fl3d_cor_post_ttc=3.0s · coronal · 0.9mm · 0.85mm/px · 11 of 80 slices shown]
[im 1/80]
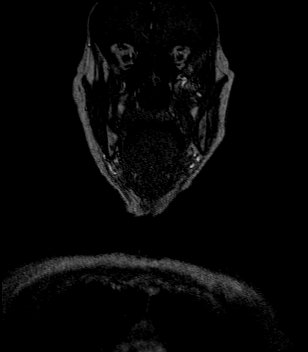
[im 5/80]
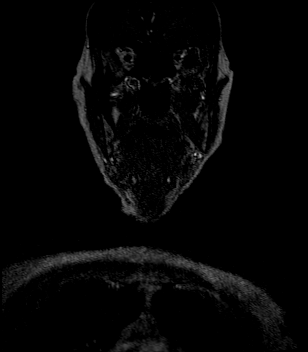
[im 14/80]
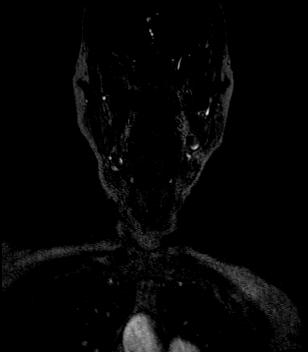
[im 24/80]
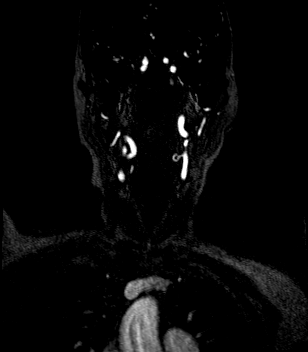
[im 33/80]
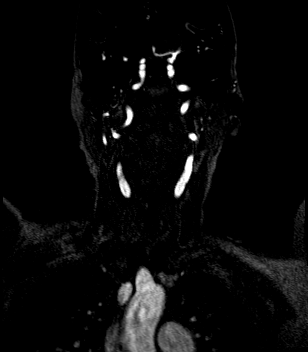
[im 42/80]
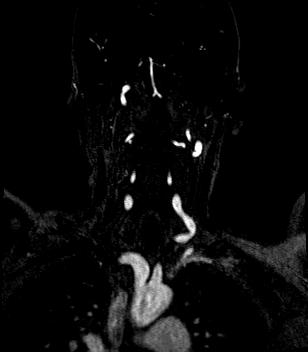
[im 47/80]
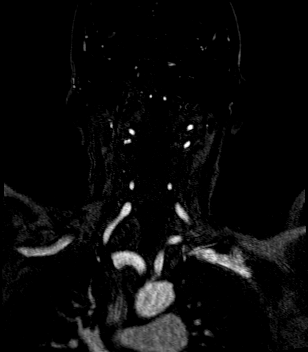
[im 56/80]
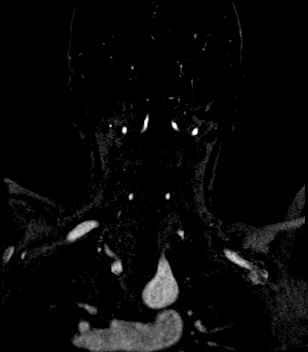
[im 66/80]
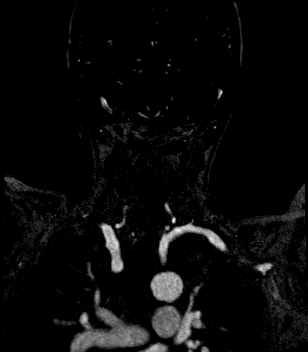
[im 70/80]
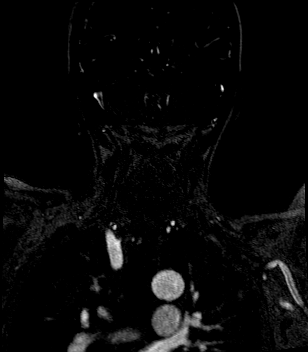
[im 75/80]
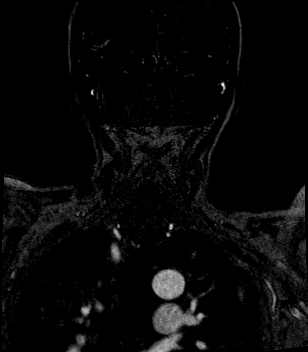

[41 of 48 positions shown; findings below may reference images not displayed]

FINDINGS: Standard aortic branching. The visualized aortic arch is normal in
caliber. No hemodynamically significant innominate or proximal
subclavian artery stenosis.

The common carotid and internal carotid arteries are patent within
the neck without hemodynamically significant stenosis.

The vertebral arteries are codominant and patent within the neck,
with antegrade flow, and without appreciable hemodynamically
significant stenosis.

The bilateral proximal M2 MCA stenoses described on the concurrently
performed noncontrast MRA of the head are not appreciated on
postcontrast imaging of the neck which includes portions of the
intracranial arterial circulation. These apparent stenoses may have
been exaggerated by artifact on the noncontrast MRA head imaging.
IMPRESSION: The bilateral common carotid, internal carotid and vertebral
arteries are patent within the neck without hemodynamically
significant stenosis.

The bilateral proximal M2 MCA stenoses described on the concurrently
performed noncontrast MRA of the head are not appreciated on
postcontrast imaging of the neck (which includes portions of the
intracranial arterial circulation). These apparent stenoses may have
been accentuated by artifact on the non-contrast MRA head.

## 2021-02-04 MED ORDER — EPINEPHRINE PF 1 MG/ML IJ SOLN
INTRAMUSCULAR | Status: AC
  Filled 2021-02-04: qty 1

## 2021-02-04 MED ORDER — PANTOPRAZOLE SODIUM 40 MG PO TBEC: 40.0000 mg | DELAYED_RELEASE_TABLET | Freq: Every day | ORAL | Status: DC

## 2021-02-04 MED ORDER — PANTOPRAZOLE SODIUM 40 MG PO TBEC
40.0000 mg | DELAYED_RELEASE_TABLET | Freq: Every day | ORAL | Status: DC
  Administered 2021-02-04 – 2021-02-05 (×2): 40 mg via ORAL
  Filled 2021-02-04 (×2): qty 1

## 2021-02-04 MED ORDER — SIMETHICONE 80 MG PO CHEW
80.0000 mg | CHEWABLE_TABLET | Freq: Two times a day (BID) | ORAL | Status: DC
  Administered 2021-02-04 – 2021-02-05 (×2): 80 mg via ORAL
  Filled 2021-02-04 (×2): qty 1

## 2021-02-04 MED ORDER — FUROSEMIDE 40 MG PO TABS: 40.0000 mg | ORAL_TABLET | Freq: Two times a day (BID) | ORAL | Status: DC

## 2021-02-04 MED ORDER — LEVETIRACETAM 250 MG PO TABS
250.0000 mg | ORAL_TABLET | Freq: Two times a day (BID) | ORAL | Status: DC
  Administered 2021-02-04 – 2021-02-05 (×2): 250 mg via ORAL
  Filled 2021-02-04 (×2): qty 1

## 2021-02-04 MED ORDER — APIXABAN 2.5 MG PO TABS: 2.5000 mg | ORAL_TABLET | Freq: Two times a day (BID) | ORAL | 1 refills | Status: AC

## 2021-02-04 MED ORDER — MELOXICAM 7.5 MG PO TABS: 7.5000 mg | ORAL_TABLET | Freq: Every day | ORAL | Status: DC | PRN

## 2021-02-04 MED ORDER — GADOBUTROL 1 MMOL/ML IV SOLN
5.0000 mL | Freq: Once | INTRAVENOUS | Status: AC | PRN
  Administered 2021-02-04: 5 mL via INTRAVENOUS

## 2021-02-04 MED ORDER — PHENYLEPHRINE-KETOROLAC 1-0.3 % IO SOLN
INTRAOCULAR | Status: AC
  Filled 2021-02-04: qty 4

## 2021-02-04 MED ORDER — ROSUVASTATIN CALCIUM 5 MG PO TABS: 2.5000 mg | ORAL_TABLET | ORAL | 1 refills | Status: DC

## 2021-02-04 NOTE — Plan of Care (Signed)
  Problem: Education: Goal: Knowledge of General Education information will improve Description: Including pain rating scale, medication(s)/side effects and non-pharmacologic comfort measures Outcome: Progressing   Problem: Health Behavior/Discharge Planning: Goal: Ability to manage health-related needs will improve Outcome: Progressing   Problem: Clinical Measurements: Goal: Ability to maintain clinical measurements within normal limits will improve Outcome: Progressing Goal: Will remain free from infection Outcome: Progressing Goal: Diagnostic test results will improve Outcome: Progressing Goal: Respiratory complications will improve Outcome: Progressing Goal: Cardiovascular complication will be avoided Outcome: Progressing   Problem: Activity: Goal: Risk for activity intolerance will decrease Outcome: Progressing   Problem: Nutrition: Goal: Adequate nutrition will be maintained Outcome: Progressing   Problem: Coping: Goal: Level of anxiety will decrease Outcome: Progressing   Problem: Elimination: Goal: Will not experience complications related to bowel motility Outcome: Progressing Goal: Will not experience complications related to urinary retention Outcome: Progressing   Problem: Pain Managment: Goal: General experience of comfort will improve Outcome: Progressing   Problem: Safety: Goal: Ability to remain free from injury will improve Outcome: Progressing   Problem: Skin Integrity: Goal: Risk for impaired skin integrity will decrease Outcome: Progressing   Problem: Education: Goal: Knowledge of patient specific risk factors addressed and post discharge goals established will improve Outcome: Progressing Goal: Individualized Educational Video(s) Outcome: Progressing   Problem: Self-Care: Goal: Verbalization of feelings and concerns over difficulty with self-care will improve Outcome: Progressing Goal: Ability to communicate needs accurately will  improve Outcome: Progressing

## 2021-02-04 NOTE — Plan of Care (Signed)
  Problem: Acute Rehab PT Goals(only PT should resolve) Goal: Patient Will Transfer Sit To/From Stand Outcome: Progressing Flowsheets (Taken 02/04/2021 0926) Patient will transfer sit to/from stand:  with minimal assist  with min guard assist Goal: Pt Will Transfer Bed To Chair/Chair To Bed Outcome: Progressing Flowsheets (Taken 02/04/2021 0926) Pt will Transfer Bed to Chair/Chair to Bed:  min guard assist  with min assist Goal: Pt Will Ambulate Outcome: Progressing Flowsheets (Taken 02/04/2021 0926) Pt will Ambulate:  10 feet  with minimal assist  with rolling walker Goal: Pt/caregiver will Perform Home Exercise Program Outcome: Progressing Flowsheets (Taken 02/04/2021 0926) Pt/caregiver will Perform Home Exercise Program:  For increased strengthening  For improved balance  Independently  9:27 AM, 02/04/21 Mearl Latin PT, DPT Physical Therapist at [name]

## 2021-02-04 NOTE — Progress Notes (Signed)
  Echocardiogram 2D Echocardiogram has been performed.  Fidel Levy 02/04/2021, 2:22 PM

## 2021-02-04 NOTE — Evaluation (Signed)
Physical Therapy Evaluation Patient Details Name: Paige Ferguson MRN: [id number] DOB: 1921/04/12 Today's Date: 02/04/2021   History of Present Illness  Paige Ferguson  is a 87 y.o. female, with past medical history of hypertension, hyperlipidemia, A. fib on apixaban, TIA, patient presents to ED secondary to complaints of near syncope/TIA, patient lives at home, grandson lives with her, patient presents to ED secondary to complaints of almost passing out today when she was having a heart cath, patient was getting a haircut, she was noted by her daughter that her lips turning blue, she was unresponsive, moaning, trying to talk but unable to verbalize, she was unable to open her eyes, she did appear to be having some tremors as well, episode lasted for around 30 minutes, as well daughter mentioned she had another similar episode earlier in the week at the bathroom, which prompted them to come to ED, patient currently denies any chest pain, shortness of breath, any focal deficits currently.    Clinical Impression  Patient functioning near baseline for mobility and gait but is limited for functional mobility as stated below secondary to BLE weakness, fatigue and poor standing balance. Orthostatic vitals were taken today as follows: Supine 119/64, Sitting 119/76, Standing 110/77 without c/o symptoms. Patient completes all mobility today with slow, labored movements with intermittent cueing for sequencing. Patient shows good sitting tolerance and balance EOB. She requires several attempts to transfer to standing secondary to LE weakness and poor LE positioning without cueing. Patient assisted back to bed at end of session with patient's daughter present for session. Patient will benefit from continued physical therapy in hospital and recommended venue below to increase strength, balance, endurance for safe ADLs and gait.     Follow Up Recommendations No PT follow up;Supervision for  mobility/OOB;Supervision/Assistance - 24 hour    Equipment Recommendations  None recommended by PT    Recommendations for Other Services       Precautions / Restrictions Precautions Precautions: Fall Restrictions Weight Bearing Restrictions: No      Mobility  Bed Mobility Overal bed mobility: Needs Assistance Bed Mobility: Supine to Sit;Sit to Supine     Supine to sit: Supervision Sit to supine: Supervision   General bed mobility comments: slow, labored transition to seated EOB with some cueing for sequencing    Transfers Overall transfer level: Needs assistance Equipment used: Rolling walker (2 wheeled) Transfers: Sit to/from Stand Sit to Stand: Min assist;Mod assist         General transfer comment: slow, labored, takes several attempts with cueing and assist to transfer to standing with RW  Ambulation/Gait Ambulation/Gait assistance: Min guard Gait Distance (Feet): 2 Feet Assistive device: Rolling walker (2 wheeled) Gait Pattern/deviations: Trunk flexed Gait velocity: decreased   General Gait Details: slow, lateral stepping at bedside with RW  Stairs            Wheelchair Mobility    Modified Rankin (Stroke Patients Only)       Balance Overall balance assessment: Needs assistance   Sitting balance-Leahy Scale: Good Sitting balance - Comments: seated EOB   Standing balance support: Bilateral upper extremity supported Standing balance-Leahy Scale: Fair Standing balance comment: fair/poor with RW                             Pertinent Vitals/Pain Pain Assessment: Faces Faces Pain Scale: Hurts a little bit Pain Location: toe and generalized Pain Descriptors / Indicators: Aching Pain Intervention(s): Limited activity  within patient's tolerance;Monitored during session;Repositioned    Home Living Family/patient expects to be discharged to:: Private residence Living Arrangements: Other relatives (grandson; daughter assists and  lives near) Available Help at Discharge: Family Type of Home: Mobile home Home Access: Ramped entrance     Home Layout: One level Home Equipment: Environmental consultant - 4 wheels;Transport chair;Toilet riser;Bedside commode      Prior Function Level of Independence: Needs assistance   Gait / Transfers Assistance Needed: household ambulator with rollator and family assist  ADL's / Homemaking Assistance Needed: family assists        Hand Dominance        Extremity/Trunk Assessment   Upper Extremity Assessment Upper Extremity Assessment: Defer to OT evaluation    Lower Extremity Assessment Lower Extremity Assessment: Generalized weakness    Cervical / Trunk Assessment Cervical / Trunk Assessment: Kyphotic  Communication   Communication: No difficulties  Cognition Arousal/Alertness: Awake/alert Behavior During Therapy: WFL for tasks assessed/performed Overall Cognitive Status: Within Functional Limits for tasks assessed                                        General Comments      Exercises     Assessment/Plan    PT Assessment Patient needs continued PT services  PT Problem List Decreased strength;Decreased activity tolerance;Decreased balance;Decreased mobility;Pain       PT Treatment Interventions DME instruction;Balance training;Gait training;Neuromuscular re-education;Stair training;Functional mobility training;Patient/family education;Therapeutic activities;Therapeutic exercise;Manual techniques    PT Goals (Current goals can be found in the Care Plan section)  Acute Rehab PT Goals Patient Stated Goal: Return home with family to assist PT Goal Formulation: With patient Time For Goal Achievement: 02/18/21 Potential to Achieve Goals: Good    Frequency Min 3X/week   Barriers to discharge        Co-evaluation               AM-PAC PT "6 Clicks" Mobility  Outcome Measure Help needed turning from your back to your side while in a flat bed  without using bedrails?: None Help needed moving from lying on your back to sitting on the side of a flat bed without using bedrails?: None Help needed moving to and from a bed to a chair (including a wheelchair)?: A Little Help needed standing up from a chair using your arms (e.g., wheelchair or bedside chair)?: A Little Help needed to walk in hospital room?: A Lot Help needed climbing 3-5 steps with a railing? : Total 6 Click Score: 17    End of Session Equipment Utilized During Treatment: Gait belt;Oxygen Activity Tolerance: Patient limited by fatigue;Patient tolerated treatment well Patient left: in bed;with call bell/phone within reach;with family/visitor present Nurse Communication: Mobility status PT Visit Diagnosis: Unsteadiness on feet (R26.81);Other abnormalities of gait and mobility (R26.89);Muscle weakness (generalized) (M62.81)    Time: 6433-2951 PT Time Calculation (min) (ACUTE ONLY): 27 min   Charges:   PT Evaluation $PT Eval Low Complexity: 1 Low PT Treatments $Therapeutic Activity: 23-37 mins        9:24 AM, 02/04/21 Mearl Latin PT, DPT Physical Therapist at [facility]

## 2021-02-04 NOTE — Consult Note (Signed)
[place] A. Merlene Laughter, MD     [email or web address]          Paige Ferguson is an 87 y.o. female.   ASSESSMENT/PLAN: 1.  Recurrent episodes of fall alteration of consciousness/unresponsiveness suspicious for seizures.  The patient will be started on low-dose Keppra.  EEG will be obtained. 2.  Essential tremor 3.  Left foot drop longstanding problem   This a 87 year old white female who presents with 2 spells in the past week of unresponsiveness/presyncope.  Spells are associated with patient being unresponsive but not completely losing consciousness.  She is partly amnestic to the event.  She indicates he can hear things but is unable to respond.  One spell was witnessed by her grandson and the other by her daughter.  It appears that she did have some jerking during the spell witnessed by her grandson.  The jerks seems most consistent myoclonic type activity but also could be clonic activities.  The patient did turn blue around the lips with both spells.  The patient woke up with her tongue being bitten but has no recollection how this happened.  It appears that she did not have the tongue biting episode during the noted spells.  She is now back to baseline.  She is noted to have tremor which they tell me she has had for many years and also involves the head at times.  Her grandson who is in the room also seems to have tremor and tells me he has been diagnosed with essential tremor.  She is noted to have a left foot drop on examination but apparently this has been a longstanding issue.  She has significant osteoarthritis involving the knees and was due to have surgery but this was not done because of her age.  The review of systems otherwise negative.    GENERAL: This is a pleasant female who appears about 10 to 82 years younger than his age to age.  HEENT: Neck is supple no trauma noted.  ABDOMEN: soft  EXTREMITIES: No edema; there is severe arthritic changes of the  knees.  BACK: Normal  SKIN: Normal by inspection.    MENTAL STATUS: Alert and oriented. Speech, language and cognition are generally intact. Judgment and insight normal.   CRANIAL NERVES: Pupils are equal, round and reactive to light and accomodation; extra ocular movements are full, there is no significant nystagmus; visual fields are full; upper and lower facial muscles are normal in strength and symmetric, there is no flattening of the nasolabial folds; tongue is midline; uvula is midline; shoulder elevation is normal.  MOTOR: There is a left foot drop with dorsiflexion graded as 2/5.  Again this is chronic.  The other extremities show normal tone, bulk and strength; no pronator drift.  COORDINATION: Left finger to nose is normal, right finger to nose is normal, there is frequent low amplitude high-frequency tremor noted at rest and in the postural position but sometimes with action.  It is noted on both sides and relatively symmetric.  REFLEXES: Deep tendon reflexes are symmetrical and normal.   SENSATION: Normal to light touch, temperature, and pain.       Blood pressure 133/85, pulse 70, temperature 98.4 F (36.9 C), resp. rate 16, height 4\' 8"  (1.422 m), weight 55.9 kg, SpO2 100 %.  Past Medical History:  Diagnosis Date  . Allergy   . Arthritis of knee   . Cataract   . Cholelithiases   . Chronic kidney disease   . Endometrial  polyp 7/98   2 degree polyps   . Genu valgum   . Gout   . Hyperlipidemia   . Hypertension   . Left knee pain   . Osteoarthritis   . Osteoporosis   . Postmenopausal bleeding   . Recurrent UTI   . Renal abscess, right   . SCCA (squamous cell carcinoma) of skin 01/25/2021   Right Lower Back (in situ)  . SCCA (squamous cell carcinoma) of skin 01/25/2021   Left Flank (in situ)  . 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 Shoulder fracture, left   . TIA (transient ischemic attack)   . Ureteral calculi   . URI (upper  respiratory infection)     Past Surgical History:  Procedure Laterality Date  . EYE SURGERY    . HERNIA REPAIR    . HYSTEROSCOPY , FRACTIONAL D&C  7/98/  . [place]  . LEFT CATARACT SURGERY  10/99  . RIGHT CATARACT SURGERY  7/99   Dr. Dolores Lory   . RIGHT HEMINEPHRECTOMY FOR ABSCESS  2/98  . RIGHT NEPHRELITHOTOMY  1982    Family History  Problem Relation Age of Onset  . Stroke Maternal Grandmother   . Hypertension Maternal Grandmother   . Diabetes Maternal Grandmother   . Pneumonia Father   . Coronary artery disease Paternal Grandfather   . Heart defect Son     Social History:  reports that she quit smoking about 76 years ago. Her smoking use included cigarettes. She has never used smokeless tobacco. She reports that she does not drink alcohol and does not use drugs.  Allergies:  Allergies  Allergen Reactions  . Sulfa Antibiotics Nausea And Vomiting  . Amoxicillin Other (See Comments)    Unknown reaction  . Nitrofurantoin Monohyd Macro Other (See Comments)    Unknown reaction    Medications: Prior to Admission medications   Medication Sig Start Date End Date Taking? Authorizing Provider  amLODipine (NORVASC) 5 MG tablet Take 1 tablet (5 mg total) by mouth daily. 10/19/20  Yes Hawks, Christy A, FNP  apixaban (ELIQUIS) 5 MG TABS tablet Take 1 tablet (5 mg total) by mouth 2 (two) times daily. 10/19/20  Yes Hawks, Christy A, FNP  benazepril (LOTENSIN) 40 MG tablet Take 1 tablet (40 mg total) by mouth daily. 10/19/20  Yes Hawks, Christy A, FNP  Calcium-Magnesium-Vitamin D (CALCIUM 1200+D3 PO) Take 1 tablet by mouth daily.   Yes [provider]  carvedilol (COREG) 6.25 MG tablet Take 1 tablet (6.25 mg total) by mouth 2 (two) times daily with a meal. 10/19/20  Yes Hawks, Christy A, FNP  diphenhydrAMINE-zinc acetate (BENADRYL ITCH STOPPING) cream Apply topically 3 (three) times daily as needed for itching. 11/26/20  Yes Ivy Lynn, NP  furosemide  (LASIX) 40 MG tablet Take 1 tablet 2 times daily. (Needs to be seen before next refill) Patient taking differently: Take 40 mg by mouth 2 (two) times daily. 01/20/21  Yes Hawks, Christy A, FNP  meloxicam (MOBIC) 7.5 MG tablet TAKE 1 TABLET DAILY Patient taking differently: Take 7.5 mg by mouth daily. 01/27/21  Yes Hawks, Theador Hawthorne, FNP  Misc Natural Products (GNP GLUCOSAME CHONDROITIN DS) TABS Take 1 tablet by mouth daily.   Yes [provider]  Multiple Vitamins-Minerals (CENTRUM SILVER ADULT 50+) TABS Take 1 tablet by mouth daily.   Yes [provider]  mupirocin ointment (BACTROBAN) 2 % Apply 1 application topically 2 (two) times daily. 12/15/20  Yes  Sheffield, Kelli R, PA-C  Omega-3 Fatty Acids (FISH OIL) 1000 MG CAPS Take 1 capsule by mouth 2 (two) times daily.   Yes [provider]  pantoprazole (PROTONIX) 20 MG tablet Take 1 tablet (20 mg total) by mouth daily. 12/03/20  Yes [name], MD  raloxifene (EVISTA) 60 MG tablet Take 1 tablet (60 mg total) by mouth daily. 10/19/20  Yes Hawks, Christy A, FNP  terbinafine (LAMISIL AT) 1 % cream Apply 1 application topically 2 (two) times daily. 11/26/20  Yes Ivy Lynn, NP  vitamin E 200 UNIT capsule Take 200 Units by mouth daily.   Yes [provider]    Scheduled Meds: . apixaban  2.5 mg Oral BID  . carvedilol  6.25 mg Oral BID WC  . multivitamin with minerals  1 tablet Oral Daily  . omega-3 acid ethyl esters  1 g Oral BID  . pantoprazole  40 mg Oral Daily  . raloxifene  60 mg Oral Daily   Continuous Infusions: PRN Meds:.acetaminophen **OR** acetaminophen (TYLENOL) oral liquid 160 mg/5 mL **OR** acetaminophen     Results for orders placed or performed during the hospital encounter of 02/03/21 (from the past 48 hour(s))  CBC with Differential/Platelet     Status: Abnormal   Collection Time: 02/03/21  4:09 PM  Result Value Ref Range   WBC 6.6 4.0 - 10.5 K/uL   RBC 3.79 (L) 3.87 - 5.11 MIL/uL    Hemoglobin 12.2 12.0 - 15.0 g/dL   HCT 38.4 36.0 - 46.0 %   MCV 101.3 (H) 80.0 - 100.0 fL   MCH 32.2 26.0 - 34.0 pg   MCHC 31.8 30.0 - 36.0 g/dL   RDW 13.2 11.5 - 15.5 %   Platelets 241 150 - 400 K/uL   nRBC 0.0 0.0 - 0.2 %   Neutrophils Relative % 45 %   Neutro Abs 3.0 1.7 - 7.7 K/uL   Lymphocytes Relative 32 %   Lymphs Abs 2.1 0.7 - 4.0 K/uL   Monocytes Relative 18 %   Monocytes Absolute 1.2 (H) 0.1 - 1.0 K/uL   Eosinophils Relative 4 %   Eosinophils Absolute 0.2 0.0 - 0.5 K/uL   Basophils Relative 1 %   Basophils Absolute 0.0 0.0 - 0.1 K/uL   Immature Granulocytes 0 %   Abs Immature Granulocytes 0.01 0.00 - 0.07 K/uL    Comment: Performed at [facility], [address], [place], [place] [address]  Comprehensive metabolic panel     Status: Abnormal   Collection Time: 02/03/21  4:09 PM  Result Value Ref Range   Sodium 136 135 - 145 mmol/L   Potassium 4.2 3.5 - 5.1 mmol/L   Chloride 95 (L) 98 - 111 mmol/L   CO2 34 (H) 22 - 32 mmol/L   Glucose, Bld 134 (H) 70 - 99 mg/dL    Comment: Glucose reference range applies only to samples taken after fasting for at least 8 hours.   BUN 40 (H) 8 - 23 mg/dL   Creatinine, Ser 1.09 (H) 0.44 - 1.00 mg/dL   Calcium 9.2 8.9 - 10.3 mg/dL   Total Protein 6.9 6.5 - 8.1 g/dL   Albumin 3.5 3.5 - 5.0 g/dL   AST 24 15 - 41 U/L   ALT 17 0 - 44 U/L   Alkaline Phosphatase 58 38 - 126 U/L   Total Bilirubin 0.7 0.3 - 1.2 mg/dL   GFR, Estimated 46 (L) >60 mL/min    Comment: (NOTE) Calculated using the  CKD-EPI Creatinine Equation (2021)    Anion gap 7 5 - 15    Comment: Performed at [facility], [address], [place], [place] [address]  Protime-INR     Status: Abnormal   Collection Time: 02/03/21  4:09 PM  Result Value Ref Range   Prothrombin Time 16.6 (H) 11.4 - 15.2 seconds   INR 1.4 (H) 0.8 - 1.2    Comment: (NOTE) INR goal varies based on device and disease states. Performed at [facility], [address] [place], [place] [address]    Resp Panel by RT-PCR (Flu A&B, Covid) Nasopharyngeal Swab     Status: None   Collection Time: 02/03/21  8:09 PM   Specimen: Nasopharyngeal Swab; Nasopharyngeal(NP) swabs in vial transport medium  Result Value Ref Range   SARS Coronavirus 2 by RT PCR NEGATIVE NEGATIVE    Comment: (NOTE) SARS-CoV-2 target nucleic acids are NOT DETECTED.  The SARS-CoV-2 RNA is generally detectable in upper respiratory specimens during the acute phase of infection. The lowest concentration of SARS-CoV-2 viral copies this assay can detect is 138 copies/mL. A negative result does not preclude SARS-Cov-2 infection and should not be used as the sole basis for treatment or other patient management decisions. A negative result may occur with  improper specimen collection/handling, submission of specimen other than nasopharyngeal swab, presence of viral mutation(s) within the areas targeted by this assay, and inadequate number of viral copies(<138 copies/mL). A negative result must be combined with clinical observations, patient history, and epidemiological information. The expected result is Negative.  Fact Sheet for Patients:  EntrepreneurPulse.com.au  Fact Sheet for Healthcare Providers:  IncredibleEmployment.be  This test is no t yet approved or cleared by the [place] FDA and  has been authorized for detection and/or diagnosis of SARS-CoV-2 by FDA under an Emergency Use Authorization (EUA). This EUA will remain  in effect (meaning this test can be used) for the duration of the COVID-19 declaration under Section 564(b)(1) of the Act, 21 U.S.C.section 360bbb-3(b)(1), unless the authorization is terminated  or revoked sooner.       Influenza A by PCR NEGATIVE NEGATIVE   Influenza B by PCR NEGATIVE NEGATIVE    Comment: (NOTE) The Xpert Xpress SARS-CoV-2/FLU/RSV plus assay is intended as an aid in the diagnosis of influenza from Nasopharyngeal swab specimens  and should not be used as a sole basis for treatment. Nasal washings and aspirates are unacceptable for Xpert Xpress SARS-CoV-2/FLU/RSV testing.  Fact Sheet for Patients: EntrepreneurPulse.com.au  Fact Sheet for Healthcare Providers: IncredibleEmployment.be  This test is not yet approved or cleared by the [place] FDA and has been authorized for detection and/or diagnosis of SARS-CoV-2 by FDA under an Emergency Use Authorization (EUA). This EUA will remain in effect (meaning this test can be used) for the duration of the COVID-19 declaration under Section 564(b)(1) of the Act, 21 U.S.C. section 360bbb-3(b)(1), unless the authorization is terminated or revoked.  Performed at [facility], [address], [place], [place] [address]   Urinalysis, Routine w reflex microscopic Urine, Clean Catch     Status: Abnormal   Collection Time: 02/03/21  8:10 PM  Result Value Ref Range   Color, Urine STRAW (A) YELLOW   APPearance CLEAR CLEAR   Specific Gravity, Urine 1.006 1.005 - 1.030   pH 7.0 5.0 - 8.0   Glucose, UA NEGATIVE NEGATIVE mg/dL   Hgb urine dipstick NEGATIVE NEGATIVE   Bilirubin Urine NEGATIVE NEGATIVE   Ketones, ur NEGATIVE NEGATIVE mg/dL   Protein,  ur NEGATIVE NEGATIVE mg/dL   Nitrite NEGATIVE NEGATIVE   Leukocytes,Ua NEGATIVE NEGATIVE    Comment: Performed at [facility], [address], [place], [place] [address]  Urine rapid drug screen (hosp performed)     Status: None   Collection Time: 02/03/21  8:10 PM  Result Value Ref Range   Opiates NONE DETECTED NONE DETECTED   Cocaine NONE DETECTED NONE DETECTED   Benzodiazepines NONE DETECTED NONE DETECTED   Amphetamines NONE DETECTED NONE DETECTED   Tetrahydrocannabinol NONE DETECTED NONE DETECTED   Barbiturates NONE DETECTED NONE DETECTED    Comment: (NOTE) DRUG SCREEN FOR MEDICAL PURPOSES ONLY.  IF CONFIRMATION IS NEEDED FOR ANY PURPOSE, NOTIFY LAB WITHIN 5 DAYS.  LOWEST  DETECTABLE LIMITS FOR URINE DRUG SCREEN Drug Class                     Cutoff (ng/mL) Amphetamine and metabolites    1000 Barbiturate and metabolites    200 Benzodiazepine                 035 Tricyclics and metabolites     300 Opiates and metabolites        300 Cocaine and metabolites        300 THC                            50 Performed at [facility], [address], [place], [place] [address]   Hemoglobin A1c     Status: None   Collection Time: 02/04/21  5:52 AM  Result Value Ref Range   Hgb A1c MFr Bld 5.5 4.8 - 5.6 %    Comment: (NOTE) Pre diabetes:          5.7%-6.4%  Diabetes:              >6.4%  Glycemic control for   <7.0% adults with diabetes    Mean Plasma Glucose 111.15 mg/dL    Comment: Performed at [facility] [address] [place], [place] [address]  Lipid panel     Status: Abnormal   Collection Time: 02/04/21  5:52 AM  Result Value Ref Range   Cholesterol 126 0 - 200 mg/dL   Triglycerides 47 <150 mg/dL   HDL 37 (L) >40 mg/dL   Total CHOL/HDL Ratio 3.4 RATIO   VLDL 9 0 - 40 mg/dL   LDL Cholesterol 80 0 - 99 mg/dL    Comment:        Total Cholesterol/HDL:CHD Risk Coronary Heart Disease Risk Table                     Men   Women  1/2 Average Risk   3.4   3.3  Average Risk       5.0   4.4  2 X Average Risk   9.6   7.1  3 X Average Risk  23.4   11.0        Use the calculated Patient Ratio above and the CHD Risk Table to determine the patient's CHD Risk.        ATP III CLASSIFICATION (LDL):  <100     mg/dL   Optimal  100-129  mg/dL   Near or Above                    Optimal  130-159  mg/dL   Borderline  160-189  mg/dL   High  >  190     mg/dL   Very High Performed at [facility], [address] [place], [place] [address]   CBC     Status: Abnormal   Collection Time: 02/04/21  5:52 AM  Result Value Ref Range   WBC 6.6 4.0 - 10.5 K/uL   RBC 3.63 (L) 3.87 - 5.11 MIL/uL   Hemoglobin 11.8 (L) 12.0 - 15.0 g/dL   HCT 36.8 36.0 - 46.0 %    MCV 101.4 (H) 80.0 - 100.0 fL   MCH 32.5 26.0 - 34.0 pg   MCHC 32.1 30.0 - 36.0 g/dL   RDW 13.2 11.5 - 15.5 %   Platelets 223 150 - 400 K/uL   nRBC 0.0 0.0 - 0.2 %    Comment: Performed at [facility], [address], [place], [place] [address]  Comprehensive metabolic panel     Status: Abnormal   Collection Time: 02/04/21  5:52 AM  Result Value Ref Range   Sodium 140 135 - 145 mmol/L   Potassium 3.6 3.5 - 5.1 mmol/L   Chloride 98 98 - 111 mmol/L   CO2 33 (H) 22 - 32 mmol/L   Glucose, Bld 90 70 - 99 mg/dL    Comment: Glucose reference range applies only to samples taken after fasting for at least 8 hours.   BUN 34 (H) 8 - 23 mg/dL   Creatinine, Ser 0.77 0.44 - 1.00 mg/dL   Calcium 8.9 8.9 - 10.3 mg/dL   Total Protein 6.2 (L) 6.5 - 8.1 g/dL   Albumin 3.0 (L) 3.5 - 5.0 g/dL   AST 21 15 - 41 U/L   ALT 13 0 - 44 U/L   Alkaline Phosphatase 51 38 - 126 U/L   Total Bilirubin 0.8 0.3 - 1.2 mg/dL   GFR, Estimated >60 >60 mL/min    Comment: (NOTE) Calculated using the CKD-EPI Creatinine Equation (2021)    Anion gap 9 5 - 15    Comment: Performed at [facility], [address], [place], [place] [address]    Studies/Results: HEAD CT  FINDINGS: Brain: No evidence of parenchymal hemorrhage or extra-axial fluid collection. No mass lesion, mass effect, or midline shift. No CT evidence of acute infarction. Nonspecific mild subcortical and periventricular white matter hypodensity, most in keeping with chronic small vessel ischemic change. Cerebral volume is age appropriate. No ventriculomegaly.  Vascular: No acute abnormality.  Skull: No evidence of calvarial fracture.  Sinuses/Orbits: The visualized paranasal sinuses are essentially clear.  Other:  The mastoid air cells are unopacified.  IMPRESSION: 1. No evidence of acute intracranial abnormality. No evidence of calvarial fracture. 2. Mild chronic small vessel ischemic changes in the cerebral white Matter.          the brain MRI scan is reviewed in person and shows no acute infarcts or changes. There is age-appropriate global atrophy. There is moderate deep white matter and confluent leukoencephalopathy consistent with the patient's age. There are few areas of a reduced signal involving the basal ganglia and thalami some which are likely due to lacunar infarcts.  Lashaye Fisk A. Merlene Ferguson, M.D.  Diplomate, Tax adviser of Psychiatry and Neurology ( Neurology). 02/04/2021, 3:55 PM

## 2021-02-04 NOTE — Evaluation (Signed)
Occupational Therapy Evaluation Patient Details Name: Paige Ferguson MRN: [id number] DOB: 1921/01/07 Today's Date: 02/04/2021    History of Present Illness Paige Ferguson  is a 87 y.o. female, with past medical history of hypertension, hyperlipidemia, A. fib on apixaban, TIA, patient presents to ED secondary to complaints of near syncope/TIA, patient lives at home, grandson lives with her, patient presents to ED secondary to complaints of almost passing out today when she was having a heart cath, patient was getting a haircut, she was noted by her daughter that her lips turning blue, she was unresponsive, moaning, trying to talk but unable to verbalize, she was unable to open her eyes, she did appear to be having some tremors as well, episode lasted for around 30 minutes, as well daughter mentioned she had another similar episode earlier in the week at the bathroom, which prompted them to come to ED, patient currently denies any chest pain, shortness of breath, any focal deficits currently.   Clinical Impression   Pt tolerated evaluation well. Min/mod support for sit to stand using RW. SPV assist for supine to sit bed mobility. Pt's daughter reported that pt receives assist for ADL and transfers at baseline. Pt noted to have limited L UE A/ROM from a previous injury and generalized weakness. Pt would benefit from continued acute OT services to address strength, endurance, ROM, and overall ADL status prior to d/c to home with 24/7 assist as before.     Follow Up Recommendations  No OT follow up    Equipment Recommendations  None recommended by OT    Re       Precautions / Restrictions Precautions Precautions: Fall Restrictions Weight Bearing Restrictions: No      Mobility Bed Mobility Overal bed mobility: Needs Assistance Bed Mobility: Supine to Sit;Sit to Supine     Supine to sit: Supervision Sit to supine: Supervision   General bed mobility comments: slow, labored movement     Transfers Overall transfer level: Needs assistance Equipment used: Rolling walker (2 wheeled) Transfers: Sit to/from Stand Sit to Stand: Min assist;Mod assist         General transfer comment: slow labored movement    Balance Overall balance assessment: Needs assistance   Sitting balance-Leahy Scale: Good Sitting balance - Comments: seated EOB   Standing balance support: Bilateral upper extremity supported Standing balance-Leahy Scale: Fair Standing balance comment: fair/poor with RW                           ADL either performed or assessed with clinical judgement   ADL Overall ADL's : Needs assistance/impaired                         Toilet Transfer: Minimal assistance;Moderate assistance Toilet Transfer Details (indicate cue type and reason): Partially simulated via sit to stand from EOB using RW with minimal assist. Pt likely needs minimal to moderate assist for toilet transfer with use of grab bars or RW.           General ADL Comments: ADL not directely observed but needs assist at baseline.     Vision Baseline Vision/History: No visual deficits                  Pertinent Vitals/Pain Pain Assessment: No/denies pain Faces Pain Scale: Hurts a little bit Pain Location: toe and generalized Pain Descriptors / Indicators: Aching Pain Intervention(s): Limited activity within patient's tolerance;Monitored during  session;Repositioned     Hand Dominance Right   Extremity/Trunk Assessment Upper Extremity Assessment Upper Extremity Assessment: Generalized weakness;RUE deficits/detail;LUE deficits/detail RUE Deficits / Details: Slow labored movement. Generalized weakness. LUE Deficits / Details: Limited to ~90* or less but reported typical at baseline.   Lower Extremity Assessment Lower Extremity Assessment: Defer to PT evaluation   Cervical / Trunk Assessment Cervical / Trunk Assessment: Kyphotic   Communication  Communication Communication: No difficulties   Cognition Arousal/Alertness: Awake/alert Behavior During Therapy: WFL for tasks assessed/performed Overall Cognitive Status: Within Functional Limits for tasks assessed                                                      Home Living Family/patient expects to be discharged to:: Private residence Living Arrangements: Other relatives (Daughter at times and grandson around at all times.) Available Help at Discharge: Family Type of Home: Mobile home Home Access: Ramped entrance     Home Layout: One level     Bathroom Shower/Tub: Walk-in shower (Still 12 in lip to step over.)   Bathroom Toilet: Handicapped height (Via potty chair) Bathroom Accessibility: Yes   Home Equipment: Walker - 4 wheels;Transport chair;Toilet riser;Bedside commode          Prior Functioning/Environment Level of Independence: Needs assistance  Gait / Transfers Assistance Needed: household ambulator with rollator and family assist ADL's / Homemaking Assistance Needed: family assists   Comments: Family assist with ADLs and transfers.        OT Problem List: Decreased strength;Decreased range of motion;Impaired balance (sitting and/or standing)      OT Treatment/Interventions: Self-care/ADL training;Therapeutic exercise;Therapeutic activities;Patient/family education;Balance training    OT Goals(Current goals can be found in the care plan section) Acute Rehab OT Goals Patient Stated Goal: Return home with family to assist OT Goal Formulation: With patient/family Time For Goal Achievement: 02/18/21 Potential to Achieve Goals: Fair  OT Frequency: Min 2X/week    AM-PAC OT "6 Clicks" Daily Activity     Outcome Measure Help from another person eating meals?: A Little Help from another person taking care of personal grooming?: A Little Help from another person toileting, which includes using toliet, bedpan, or urinal?: A Lot Help  from another person bathing (including washing, rinsing, drying)?: A Lot Help from another person to put on and taking off regular upper body clothing?: A Little Help from another person to put on and taking off regular lower body clothing?: A Lot 6 Click Score: 15   End of Session Equipment Utilized During Treatment: Rolling walker  Activity Tolerance: Patient tolerated treatment well Patient left: in bed;with call bell/phone within reach;with family/visitor present  OT Visit Diagnosis: Unsteadiness on feet (R26.81);Muscle weakness (generalized) (M62.81)                Time: 8756-4332 OT Time Calculation (min): 13 min Charges:  OT General Charges $OT Visit: 1 Visit OT Evaluation $OT Eval Low Complexity: 1 Low  Paige Ferguson OT, MOT   Paige Ferguson 02/04/2021, 12:14 PM

## 2021-02-04 NOTE — Plan of Care (Signed)
  Problem: Acute Rehab OT Goals (only OT should resolve) Goal: Pt. Will Perform Grooming Flowsheets (Taken 02/04/2021 1218) Pt Will Perform Grooming:  sitting  with set-up Goal: Pt. Will Perform Lower Body Bathing Flowsheets (Taken 02/04/2021 1218) Pt Will Perform Lower Body Bathing:  with min assist  sitting/lateral leans  sit to/from stand Goal: Pt. Will Perform Upper Body Dressing Flowsheets (Taken 02/04/2021 1218) Pt Will Perform Upper Body Dressing:  with supervision  sitting Goal: Pt. Will Perform Lower Body Dressing Flowsheets (Taken 02/04/2021 1218) Pt Will Perform Lower Body Dressing:  with min assist  sitting/lateral leans  sit to/from stand  with adaptive equipment Goal: Pt. Will Transfer To Toilet Flowsheets (Taken 02/04/2021 1218) Pt Will Transfer to Toilet:  with min guard assist  stand pivot transfer  grab bars Goal: Pt/Caregiver Will Perform Home Exercise Program Flowsheets (Taken 02/04/2021 1218) Pt/caregiver will Perform Home Exercise Program:  Increased ROM  Increased strength  Both right and left upper extremity  With Supervision  Jeannetta Cerutti OT, MOT

## 2021-02-04 NOTE — Discharge Summary (Addendum)
Physician Discharge Summary  [name] Ferguson:[id number] DOB: [birth year] DOA: 02/03/2021  PCP: Sharion Balloon, FNP  Admit date: 02/03/2021 Discharge date: 02/05/2021  Admitted From:  HOME  Disposition: HOME   Recommendations for Outpatient Follow-up:  1. Follow up with PCP in 1 weeks 2. Follow up with neurologist in 1-2 weeks for recheck and for EEG 3. Please order outpatient EEG.   4. Follow up with cardiologist in 2 weeks for med check 5. Please obtain BMP in one week to check electrolytes.   Discharge Condition: STABLE   CODE STATUS: DNR DIET: soft heart healthy   Brief Hospitalization Summary: Please see all hospital notes, images, labs for full details of the hospitalization. ADMISSION HPI:  Paige Ferguson  is a 87 y.o. female, with past medical history of hypertension, hyperlipidemia, A. fib on apixaban, TIA, patient presents to ED secondary to complaints of near syncope/TIA, patient lives at home, grandson lives with her, patient presents to ED secondary to complaints of almost passing out today when she was having a heart cath, patient was getting a haircut, she was noted by her daughter that her lips turning blue, she was unresponsive, moaning, trying to talk but unable to verbalize, she was unable to open her eyes, she did appear to be having some tremors as well, episode lasted for around 30 minutes, as well daughter mentioned she had another similar episode earlier in the week at the bathroom, which prompted them to come to ED, patient currently denies any chest pain, shortness of breath, any focal deficits currently.  -In ED her CT head with no acute findings, ED physician discussed with teleneurology, who recommended patient to be admitted for TIA work-up, I will recommended her to continue her Eliquis.  Hospital Course  Pt was admitted for observation for TIA work-up.  She has a history of afibrillation on apixaban.  The pharmacist is reduced her dose to 2.5 mg twice daily based  on advanced age.  The patient was monitored on telemetry and stable.  Patient had an MRI and MRA of the brain with chronic findings but no acute findings.  2D echocardiogram noted below with no significant findings to explain her current symptoms.  Her symptoms have resolved and no recurrence at this time.  PT evaluation completed with findings of no further PT recommended.  Inpatient neurology was consulted.  Recommend patient follow-up outpatient with neurology and 1 to 2 weeks for recheck.  Follow-up with cardiologist in 2 weeks for med check.  LDL cholesterol noted to be 80 and given goal would be less than 70 we will do a trial of starting low-dose rosuvastatin 3 times weekly with close outpatient follow-up.  Continue other cardiac medications noted below.  Pt was seen by neurologist Dr. Merlene Laughter and he was concerned patient was having seizures and this is likely causing her symptoms.  He has started her on keppra 250 mg BID and ordered an EEG.  Not able to be done on weekends at [facility].  Pt will discharge home and have outpatient EEG done.  I discussed with Dr. Merlene Laughter and he said that this is acceptable.  Pt to follow up with neurology in 1-2 weeks and have outpatient EEG done.  Pt stable for discharge home with family and 24/7 supervision which they have agreed to provide.    Discharge Diagnoses:  Active Problems:   HTN (hypertension)   Hyperlipidemia   TIA (transient ischemic attack)   Atrial fibrillation [facility])   Discharge Instructions:  Allergies  as of 02/05/2021      Reactions   Sulfa Antibiotics Nausea And Vomiting   Amoxicillin Other (See Comments)   Unknown reaction   Nitrofurantoin Monohyd Macro Other (See Comments)   Unknown reaction      Medication List    STOP taking these medications   mupirocin ointment 2 % Commonly known as: BACTROBAN     TAKE these medications   amLODipine 5 MG tablet Commonly known as: NORVASC Take 1 tablet (5 mg total) by mouth daily.   apixaban  2.5 MG Tabs tablet Commonly known as: Eliquis Take 1 tablet (2.5 mg total) by mouth 2 (two) times daily. What changed:   medication strength  how much to take   Benadryl Itch Stopping cream Generic drug: diphenhydrAMINE-zinc acetate Apply topically 3 (three) times daily as needed for itching.   benazepril 40 MG tablet Commonly known as: LOTENSIN Take 1 tablet (40 mg total) by mouth daily.   CALCIUM 1200+D3 PO Take 1 tablet by mouth daily.   carvedilol 6.25 MG tablet Commonly known as: COREG Take 1 tablet (6.25 mg total) by mouth 2 (two) times daily with a meal.   Centrum Silver Adult 50+ Tabs Take 1 tablet by mouth daily.   Fish Oil 1000 MG Caps Take 1 capsule by mouth 2 (two) times daily.   furosemide 40 MG tablet Commonly known as: LASIX Take 1 tablet (40 mg total) by mouth 2 (two) times daily. What changed: See the new instructions.   GNP Glucosame Chondroitin DS Tabs Take 1 tablet by mouth daily.   levETIRAcetam 250 MG tablet Commonly known as: KEPPRA Take 1 tablet (250 mg total) by mouth 2 (two) times daily.   meloxicam 7.5 MG tablet Commonly known as: MOBIC Take 1 tablet (7.5 mg total) by mouth daily as needed for pain. What changed:   when to take this  reasons to take this   pantoprazole 20 MG tablet Commonly known as: PROTONIX Take 1 tablet (20 mg total) by mouth daily.   raloxifene 60 MG tablet Commonly known as: EVISTA Take 1 tablet (60 mg total) by mouth daily.   rosuvastatin 5 MG tablet Commonly known as: CRESTOR Take 0.5 tablets (2.5 mg total) by mouth 3 (three) times a week.   terbinafine 1 % cream Commonly known as: LamISIL AT Apply 1 application topically 2 (two) times daily.   vitamin E 200 UNIT capsule Take 200 Units by mouth daily.       Follow-up Information    Sharion Balloon, FNP. Schedule an appointment as soon as possible for a visit in 1 week(s).   Specialty: Family Medicine Why: Hospital Follow Up  Contact  information: [place] [address] [phone]        Burnell Blanks, MD. Schedule an appointment as soon as possible for a visit in 2 week(s).   Specialty: Cardiology Contact information: [address] [address] [place] [address] [phone]        Phillips Odor, MD. Schedule an appointment as soon as possible for a visit in 2 week(s).   Specialty: Neurology Why: Hospital Follow Up for TIA symptoms  Contact information: [address] [place] [address] [phone]              Allergies  Allergen Reactions  . Sulfa Antibiotics Nausea And Vomiting  . Amoxicillin Other (See Comments)    Unknown reaction  . Nitrofurantoin Monohyd Macro Other (See Comments)    Unknown  reaction   Allergies as of 02/05/2021      Reactions   Sulfa Antibiotics Nausea And Vomiting   Amoxicillin Other (See Comments)   Unknown reaction   Nitrofurantoin Monohyd Macro Other (See Comments)   Unknown reaction      Medication List    STOP taking these medications   mupirocin ointment 2 % Commonly known as: BACTROBAN     TAKE these medications   amLODipine 5 MG tablet Commonly known as: NORVASC Take 1 tablet (5 mg total) by mouth daily.   apixaban 2.5 MG Tabs tablet Commonly known as: Eliquis Take 1 tablet (2.5 mg total) by mouth 2 (two) times daily. What changed:   medication strength  how much to take   Benadryl Itch Stopping cream Generic drug: diphenhydrAMINE-zinc acetate Apply topically 3 (three) times daily as needed for itching.   benazepril 40 MG tablet Commonly known as: LOTENSIN Take 1 tablet (40 mg total) by mouth daily.   CALCIUM 1200+D3 PO Take 1 tablet by mouth daily.   carvedilol 6.25 MG tablet Commonly known as: COREG Take 1 tablet (6.25 mg total) by mouth 2 (two) times daily with a meal.   Centrum Silver Adult 50+ Tabs Take 1 tablet by mouth daily.   Fish Oil 1000 MG Caps Take 1 capsule by mouth 2  (two) times daily.   furosemide 40 MG tablet Commonly known as: LASIX Take 1 tablet (40 mg total) by mouth 2 (two) times daily. What changed: See the new instructions.   GNP Glucosame Chondroitin DS Tabs Take 1 tablet by mouth daily.   levETIRAcetam 250 MG tablet Commonly known as: KEPPRA Take 1 tablet (250 mg total) by mouth 2 (two) times daily.   meloxicam 7.5 MG tablet Commonly known as: MOBIC Take 1 tablet (7.5 mg total) by mouth daily as needed for pain. What changed:   when to take this  reasons to take this   pantoprazole 20 MG tablet Commonly known as: PROTONIX Take 1 tablet (20 mg total) by mouth daily.   raloxifene 60 MG tablet Commonly known as: EVISTA Take 1 tablet (60 mg total) by mouth daily.   rosuvastatin 5 MG tablet Commonly known as: CRESTOR Take 0.5 tablets (2.5 mg total) by mouth 3 (three) times a week.   terbinafine 1 % cream Commonly known as: LamISIL AT Apply 1 application topically 2 (two) times daily.   vitamin E 200 UNIT capsule Take 200 Units by mouth daily.       Procedures/Studies: CT Head Wo Contrast  Result Date: 02/03/2021 CLINICAL DATA:  Syncope while getting hair cut. Recent TIA. Hypertension. EXAM: CT HEAD WITHOUT CONTRAST TECHNIQUE: Contiguous axial images were obtained from the base of the skull through the vertex without intravenous contrast. COMPARISON:  None. FINDINGS: Brain: No evidence of parenchymal hemorrhage or extra-axial fluid collection. No mass lesion, mass effect, or midline shift. No CT evidence of acute infarction. Nonspecific mild subcortical and periventricular white matter hypodensity, most in keeping with chronic small vessel ischemic change. Cerebral volume is age appropriate. No ventriculomegaly. Vascular: No acute abnormality. Skull: No evidence of calvarial fracture. Sinuses/Orbits: The visualized paranasal sinuses are essentially clear. Other:  The mastoid air cells are unopacified. IMPRESSION: 1. No evidence  of acute intracranial abnormality. No evidence of calvarial fracture. 2. Mild chronic small vessel ischemic changes in the cerebral white matter. Electronically Signed   By: Ilona Sorrel M.D.   On: 02/03/2021 17:28   MR ANGIO HEAD WO CONTRAST  Result Date:  02/04/2021 CLINICAL DATA:  Transient ischemic attack (TIA). EXAM: MRA HEAD WITHOUT CONTRAST TECHNIQUE: Angiographic images of the Circle of Willis were obtained using MRA technique without intravenous contrast. COMPARISON:  Concurrently performed brain MRI 02/04/2021. Head CT 02/03/2021. FINDINGS: The intracranial internal carotid arteries are patent. The M1 middle cerebral arteries are patent. No proximal M2 branch occlusion. Apparent moderate to moderately severe stenosis within a proximal M2 right MCA branch vessel. Additionally, there are sites of apparent moderate stenosis within the proximal left M2 MCA branch vessels. However, these apparent stenoses are not appreciated on the concurrently performed contrast-enhanced MRA of the neck (which includes portions of the intracranial arterial vasculature), and may be exaggerated by artifact on this non-contrast exam. The anterior cerebral arteries are patent. The intracranial vertebral arteries are patent. The basilar artery is patent. Severe focal stenosis within the distal right PICA (series 1059, image 10). The posterior cerebral arteries are patent. Apparent severe segmental stenosis within the P2 left PCA. A left posterior communicating artery is present. The right posterior communicating artery is hypoplastic or absent. No intracranial aneurysm is identified. IMPRESSION: No intracranial large vessel occlusion. Intracranial atherosclerotic disease multifocal stenoses, most notably as follows. Apparent moderate to moderately severe stenosis within a proximal M2 right MCA branch vessel. Additionally, there are sites of apparent moderate stenosis within the proximal left M2 MCA branch vessels. However, these  apparent stenoses are not appreciated on the concurrently performed contrast-enhanced MRA of the neck (which includes portions of the intracranial arterial vasculature), and may be exaggerated by artifact on this non-contrast exam. Apparent severe segmental stenosis within the P2 segment of the left posterior cerebral artery. This stenosis could also potentially be exaggerated by artifact on the current exam. Severe focal stenosis within the distal right PICA. Electronically Signed   By: Kellie Simmering DO   On: 02/04/2021 16:05   MR ANGIO NECK W WO CONTRAST  Result Date: 02/04/2021 CLINICAL DATA:  TIA (transient ischemic attack). EXAM: MRA NECK WITHOUT AND WITH CONTRAST TECHNIQUE: Multiplanar and multiecho pulse sequences of the neck were obtained without and with intravenous contrast. Angiographic images of the neck were obtained using MRA technique without and with intravenous contrast. CONTRAST:  14mL GADAVIST GADOBUTROL 1 MMOL/ML IV SOLN COMPARISON:  No pertinent prior exams available for comparison. FINDINGS: Standard aortic branching. The visualized aortic arch is normal in caliber. No hemodynamically significant innominate or proximal subclavian artery stenosis. The common carotid and internal carotid arteries are patent within the neck without hemodynamically significant stenosis. The vertebral arteries are codominant and patent within the neck, with antegrade flow, and without appreciable hemodynamically significant stenosis. The bilateral proximal M2 MCA stenoses described on the concurrently performed noncontrast MRA of the head are not appreciated on postcontrast imaging of the neck which includes portions of the intracranial arterial circulation. These apparent stenoses may have been exaggerated by artifact on the noncontrast MRA head imaging. IMPRESSION: The bilateral common carotid, internal carotid and vertebral arteries are patent within the neck without hemodynamically significant stenosis. The  bilateral proximal M2 MCA stenoses described on the concurrently performed noncontrast MRA of the head are not appreciated on postcontrast imaging of the neck (which includes portions of the intracranial arterial circulation). These apparent stenoses may have been accentuated by artifact on the non-contrast MRA head. Electronically Signed   By: Kellie Simmering DO   On: 02/04/2021 15:46   MR BRAIN WO CONTRAST  Result Date: 02/04/2021 INDICATION: Transient ischemic attack (TIA). EXAM: MRI HEAD WITHOUT CONTRAST TECHNIQUE: Multiplanar, multiecho  pulse sequences of the brain and surrounding structures were obtained without intravenous contrast. COMPARISON:  Head CT 02/03/2021. FINDINGS: Brain: Mild intermittent motion degradation. Mild for age cerebral and cerebellar atrophy. Mild-to-moderate multifocal T2/FLAIR hyperintensity within the cerebral white matter is nonspecific, but compatible with chronic small vessel ischemic disease. Chronic small vessel ischemic changes are also present within the deep gray nuclei and pons. Redemonstrated small chronic infarct within the right cerebellar hemisphere. There is no acute infarct. No evidence of intracranial mass. No chronic intracranial blood products. No extra-axial fluid collection. No midline shift. Partially empty and slightly expanded sella turcica. Vascular: Expected proximal arterial flow voids. Skull and upper cervical spine: No focal marrow lesion. Incompletely assessed upper cervical spondylosis. Sinuses/Orbits: Visualized orbits show no acute finding. Trace bilateral ethmoid sinus mucosal thickening. IMPRESSION: No evidence of acute intracranial abnormality, including acute infarction. Mild-to-moderate cerebral white matter chronic small vessel ischemic disease. Additional chronic small vessel ischemic changes within the deep gray nuclei and pons. Redemonstrated small chronic right cerebellar infarct. Mild for age generalized parenchymal atrophy. Electronically  Signed   By: Kellie Simmering DO   On: 02/04/2021 16:06   ECHOCARDIOGRAM COMPLETE  Result Date: 02/04/2021    ECHOCARDIOGRAM REPORT   Patient Name:   Paige Ferguson Date of Exam: 02/04/2021 Medical Rec #:  [id number]     Height:       56.0 in Accession #:    [id number]    Weight:       123.2 lb Date of Birth:  [birth year]      BSA:          1.445 m Patient Age:    73 years      BP:           140/97 mmHg Patient Gender: F             HR:           77 bpm. Exam Location:  [facility] Procedure: 2D Echo, Color Doppler and Cardiac Doppler Indications:    TIA G45.9  History:        Patient has prior history of Echocardiogram examinations, most                 recent 06/04/2019. TIA; Risk Factors:Hypertension and                 Dyslipidemia.  Sonographer:    Bernadene Person RDCS Referring Phys: [place]  1. Left ventricular ejection fraction, by estimation, is 55 to 60%. The left ventricle has normal function. The left ventricle has no regional wall motion abnormalities. Left ventricular diastolic parameters are indeterminate.  2. Right ventricular systolic function is low normal. The right ventricular size is normal. There is mildly elevated pulmonary artery systolic pressure. The estimated right ventricular systolic pressure is 74.0 mmHg.  3. Cannot exclude a loculated posterior pericardial effusion. Left pleural effusion in similar position making this difficult to assess.  4. The mitral valve is degenerative. Mild mitral valve regurgitation.  5. The aortic valve is tricuspid. Aortic valve regurgitation is trivial. Mild aortic valve sclerosis is present, with no evidence of aortic valve stenosis.  6. The inferior vena cava is dilated in size with >50% respiratory variability, suggesting right atrial pressure of 8 mmHg. FINDINGS  Left Ventricle: Left ventricular ejection fraction, by estimation, is 55 to 60%. The left ventricle has normal function. The left ventricle has no regional wall motion  abnormalities. The left ventricular internal cavity size  was normal in size. There is  no left ventricular hypertrophy. Left ventricular diastolic parameters are indeterminate. Right Ventricle: The right ventricular size is normal. No increase in right ventricular wall thickness. Right ventricular systolic function is low normal. There is mildly elevated pulmonary artery systolic pressure. The tricuspid regurgitant velocity is 3.03 m/s, and with an assumed right atrial pressure of 8 mmHg, the estimated right ventricular systolic pressure is 81.8 mmHg. Left Atrium: Left atrial size was normal in size. Right Atrium: Right atrial size was normal in size. Pericardium: Cannot exclude a loculated posterior pericardial effusion. Mitral Valve: The mitral valve is degenerative in appearance. There is mild thickening of the mitral valve leaflet(s). There is mild calcification of the mitral valve leaflet(s). Mild to moderate mitral annular calcification. Mild mitral valve regurgitation. Tricuspid Valve: The tricuspid valve is grossly normal. Tricuspid valve regurgitation is mild. Aortic Valve: The aortic valve is tricuspid. There is moderate aortic valve annular calcification. Aortic valve regurgitation is trivial. Mild aortic valve sclerosis is present, with no evidence of aortic valve stenosis. Pulmonic Valve: The pulmonic valve was grossly normal. Pulmonic valve regurgitation is trivial. Aorta: The aortic root is normal in size and structure. Venous: The inferior vena cava is dilated in size with greater than 50% respiratory variability, suggesting right atrial pressure of 8 mmHg. IAS/Shunts: No atrial level shunt detected by color flow Doppler. Additional Comments: There is pleural effusion in the left lateral region.  LEFT VENTRICLE PLAX 2D LVIDd:         3.80 cm LVIDs:         2.40 cm LV PW:         0.80 cm LV IVS:        0.90 cm LVOT diam:     1.70 cm LV SV:         32 LV SV Index:   22 LVOT Area:     2.27 cm  RIGHT  VENTRICLE TAPSE (M-mode): 1.2 cm LEFT ATRIUM           Index       RIGHT ATRIUM           Index LA diam:      3.00 cm 2.08 cm/m  RA Area:     11.50 cm LA Vol (A2C): 36.9 ml 25.54 ml/m RA Volume:   21.30 ml  14.74 ml/m LA Vol (A4C): 41.4 ml 28.65 ml/m  AORTIC VALVE LVOT Vmax:   58.70 cm/s LVOT Vmean:  44.600 cm/s LVOT VTI:    0.139 m  AORTA Ao Root diam: 3.10 cm Ao Asc diam:  3.60 cm TRICUSPID VALVE TR Peak grad:   36.7 mmHg TR Vmax:        303.00 cm/s  SHUNTS Systemic VTI:  0.14 m Systemic Diam: 1.70 cm Rozann Lesches MD Electronically signed by Rozann Lesches MD Signature Date/Time: 02/04/2021/5:03:26 PM    Final      Subjective: Pt reported that she is feeling much better today she is anxious to get back home today, did well with PT, no complaints.   Discharge Exam: Vitals:   02/05/21 0552 02/05/21 0858  BP: 116/67 128/75  Pulse: 76 87  Resp: 18 19  Temp: 98 F (36.7 C)   SpO2: 97% 99%   Vitals:   02/04/21 1424 02/04/21 2144 02/05/21 0552 02/05/21 0858  BP: 133/85 (!) 149/92 116/67 128/75  Pulse: 70 96 76 87  Resp:  19 18 19   Temp: 98.4 F (36.9 C) 98.1 F (36.7 C)  21 F (36.7 C)   TempSrc:      SpO2: 100% 96% 97% 99%  Weight:      Height:       General: Pt is alert, awake, not in acute distress Cardiovascular: normal S1/S2 +, no rubs, no gallops Respiratory: CTA bilaterally, no wheezing, no rhonchi Abdominal: Soft, NT, ND, bowel sounds + Extremities: no edema, no cyanosis Neurological: nonfocal exam.     The results of significant diagnostics from this hospitalization (including imaging, microbiology, ancillary and laboratory) are listed below for reference.     Microbiology: Recent Results (from the past 240 hour(s))  Resp Panel by RT-PCR (Flu A&B, Covid) Nasopharyngeal Swab     Status: None   Collection Time: 02/03/21  8:09 PM   Specimen: Nasopharyngeal Swab; Nasopharyngeal(NP) swabs in vial transport medium  Result Value Ref Range Status   SARS Coronavirus 2  by RT PCR NEGATIVE NEGATIVE Final    Comment: (NOTE) SARS-CoV-2 target nucleic acids are NOT DETECTED.  The SARS-CoV-2 RNA is generally detectable in upper respiratory specimens during the acute phase of infection. The lowest concentration of SARS-CoV-2 viral copies this assay can detect is 138 copies/mL. A negative result does not preclude SARS-Cov-2 infection and should not be used as the sole basis for treatment or other patient management decisions. A negative result may occur with  improper specimen collection/handling, submission of specimen other than nasopharyngeal swab, presence of viral mutation(s) within the areas targeted by this assay, and inadequate number of viral copies(<138 copies/mL). A negative result must be combined with clinical observations, patient history, and epidemiological information. The expected result is Negative.  Fact Sheet for Patients:  EntrepreneurPulse.com.au  Fact Sheet for Healthcare Providers:  IncredibleEmployment.be  This test is no t yet approved or cleared by the [place] FDA and  has been authorized for detection and/or diagnosis of SARS-CoV-2 by FDA under an Emergency Use Authorization (EUA). This EUA will remain  in effect (meaning this test can be used) for the duration of the COVID-19 declaration under Section 564(b)(1) of the Act, 21 U.S.C.section 360bbb-3(b)(1), unless the authorization is terminated  or revoked sooner.       Influenza A by PCR NEGATIVE NEGATIVE Final   Influenza B by PCR NEGATIVE NEGATIVE Final    Comment: (NOTE) The Xpert Xpress SARS-CoV-2/FLU/RSV plus assay is intended as an aid in the diagnosis of influenza from Nasopharyngeal swab specimens and should not be used as a sole basis for treatment. Nasal washings and aspirates are unacceptable for Xpert Xpress SARS-CoV-2/FLU/RSV testing.  Fact Sheet for Patients: EntrepreneurPulse.com.au  Fact  Sheet for Healthcare Providers: IncredibleEmployment.be  This test is not yet approved or cleared by the [place] FDA and has been authorized for detection and/or diagnosis of SARS-CoV-2 by FDA under an Emergency Use Authorization (EUA). This EUA will remain in effect (meaning this test can be used) for the duration of the COVID-19 declaration under Section 564(b)(1) of the Act, 21 U.S.C. section 360bbb-3(b)(1), unless the authorization is terminated or revoked.  Performed at [facility], [address], [place], [place] [address]   Urine Culture     Status: None   Collection Time: 02/03/21  8:10 PM   Specimen: Urine, Random  Result Value Ref Range Status   Specimen Description   Final    URINE, RANDOM Performed at [facility], [address] [place], [place] [address]    Special Requests   Final    NONE Performed at [facility], [place]  [address] [place], [place] [address]    Culture   Final    NO GROWTH Performed at [facility] [facility] Lab, [place] [address], [place], [place] [address]    Report Status 02/05/2021 FINAL  Final     Labs: BNP (last 3 results) No results for input(s): BNP in the last 8760 hours. Basic Metabolic Panel: Recent Labs  Lab 02/02/21 1602 02/03/21 1609 02/04/21 0552  NA 140 136 140  K 4.2 4.2 3.6  CL 96 95* 98  CO2 31* 34* 33*  GLUCOSE 101* 134* 90  BUN 29 40* 34*  CREATININE 0.92 1.09* 0.77  CALCIUM 9.2 9.2 8.9   Liver Function Tests: Recent Labs  Lab 02/02/21 1602 02/03/21 1609 02/04/21 0552  AST 25 24 21   ALT 15 17 13   ALKPHOS 70 58 51  BILITOT 0.5 0.7 0.8  PROT 6.3 6.9 6.2*  ALBUMIN 3.7 3.5 3.0*   No results for input(s): LIPASE, AMYLASE in the last 168 hours. No results for input(s): AMMONIA in the last 168 hours. CBC: Recent Labs  Lab 02/02/21 1602 02/03/21 1609 02/04/21 0552  WBC 6.9 6.6 6.6  NEUTROABS 3.1 3.0  --   HGB 11.5 12.2 11.8*  HCT 34.2 38.4 36.8  MCV 98* 101.3* 101.4*  PLT 235 241 223    Cardiac Enzymes: No results for input(s): CKTOTAL, CKMB, CKMBINDEX, TROPONINI in the last 168 hours. BNP: Invalid input(s): POCBNP CBG: No results for input(s): GLUCAP in the last 168 hours. D-Dimer No results for input(s): DDIMER in the last 72 hours. Hgb A1c Recent Labs    02/04/21 0552  HGBA1C 5.5   Lipid Profile Recent Labs    02/04/21 0552  CHOL 126  HDL 37*  LDLCALC 80  TRIG 47  CHOLHDL 3.4   Thyroid function studies No results for input(s): TSH, T4TOTAL, T3FREE, THYROIDAB in the last 72 hours.  Invalid input(s): FREET3 Anemia work up No results for input(s): VITAMINB12, FOLATE, FERRITIN, TIBC, IRON, RETICCTPCT in the last 72 hours. Urinalysis    Component Value Date/Time   COLORURINE STRAW (A) 02/03/2021 2010   APPEARANCEUR CLEAR 02/03/2021 2010   APPEARANCEUR Hazy (A) 06/26/2019 1618   LABSPEC 1.006 02/03/2021 2010   PHURINE 7.0 02/03/2021 2010   GLUCOSEU NEGATIVE 02/03/2021 2010   HGBUR NEGATIVE 02/03/2021 2010   BILIRUBINUR NEGATIVE 02/03/2021 2010   [place] Negative 06/26/2019 [place] 02/03/2021 2010   PROTEINUR NEGATIVE 02/03/2021 2010   UROBILINOGEN negative 04/28/2014 1013   NITRITE NEGATIVE 02/03/2021 2010   LEUKOCYTESUR NEGATIVE 02/03/2021 2010   Sepsis Labs Invalid input(s): PROCALCITONIN,  WBC,  LACTICIDVEN Microbiology Recent Results (from the past 240 hour(s))  Resp Panel by RT-PCR (Flu A&B, Covid) Nasopharyngeal Swab     Status: None   Collection Time: 02/03/21  8:09 PM   Specimen: Nasopharyngeal Swab; Nasopharyngeal(NP) swabs in vial transport medium  Result Value Ref Range Status   SARS Coronavirus 2 by RT PCR NEGATIVE NEGATIVE Final    Comment: (NOTE) SARS-CoV-2 target nucleic acids are NOT DETECTED.  The SARS-CoV-2 RNA is generally detectable in upper respiratory specimens during the acute phase of infection. The lowest concentration of SARS-CoV-2 viral copies this assay can detect is 138 copies/mL. A  negative result does not preclude SARS-Cov-2 infection and should not be used as the sole basis for treatment or other patient management decisions. A negative result may occur with  improper specimen collection/handling, submission of specimen other than nasopharyngeal swab, presence of viral mutation(s) within the areas targeted  by this assay, and inadequate number of viral copies(<138 copies/mL). A negative result must be combined with clinical observations, patient history, and epidemiological information. The expected result is Negative.  Fact Sheet for Patients:  EntrepreneurPulse.com.au  Fact Sheet for Healthcare Providers:  IncredibleEmployment.be  This test is no t yet approved or cleared by the [place] FDA and  has been authorized for detection and/or diagnosis of SARS-CoV-2 by FDA under an Emergency Use Authorization (EUA). This EUA will remain  in effect (meaning this test can be used) for the duration of the COVID-19 declaration under Section 564(b)(1) of the Act, 21 U.S.C.section 360bbb-3(b)(1), unless the authorization is terminated  or revoked sooner.       Influenza A by PCR NEGATIVE NEGATIVE Final   Influenza B by PCR NEGATIVE NEGATIVE Final    Comment: (NOTE) The Xpert Xpress SARS-CoV-2/FLU/RSV plus assay is intended as an aid in the diagnosis of influenza from Nasopharyngeal swab specimens and should not be used as a sole basis for treatment. Nasal washings and aspirates are unacceptable for Xpert Xpress SARS-CoV-2/FLU/RSV testing.  Fact Sheet for Patients: EntrepreneurPulse.com.au  Fact Sheet for Healthcare Providers: IncredibleEmployment.be  This test is not yet approved or cleared by the [place] FDA and has been authorized for detection and/or diagnosis of SARS-CoV-2 by FDA under an Emergency Use Authorization (EUA). This EUA will remain in effect (meaning this test can  be used) for the duration of the COVID-19 declaration under Section 564(b)(1) of the Act, 21 U.S.C. section 360bbb-3(b)(1), unless the authorization is terminated or revoked.  Performed at [facility], [address], [place], [place] [address]   Urine Culture     Status: None   Collection Time: 02/03/21  8:10 PM   Specimen: Urine, Random  Result Value Ref Range Status   Specimen Description   Final    URINE, RANDOM Performed at [facility], [address] [place], [place] [address]    Special Requests   Final    NONE Performed at [facility], [address] [place], [place] [address]    Culture   Final    NO GROWTH Performed at [facility], [place] [address] [place], [place] [address]    Report Status 02/05/2021 FINAL  Final    Time coordinating discharge:   SIGNED:  Irwin Brakeman, MD  [facility] 02/05/2021, 11:31 AM How to contact the [facility] Attending or Consulting provider [place] or covering provider during after hours [place], for this patient?  1. Check the care team in [facility] and look for a) attending/consulting TR[facility] provider listed and b) the [facility] team listed 2. Log into [email or web address] and use [place]'s universal password to access. If you do not have the password, please contact the hospital operator. 3. Locate the [facility] provider you are looking for under [facility] and page to a number that you can be directly reached. 4. If you still have difficulty reaching the provider, please page the [facility] (Director on Call) for the Hospitalists listed on amion for assistance.

## 2021-02-04 NOTE — Plan of Care (Signed)
Problem: Education: Goal: Knowledge of General Education information will improve Description: Including pain rating scale, medication(s)/side effects and non-pharmacologic comfort measures 02/04/2021 1735 by Adam Phenix, RN Outcome: Adequate for Discharge 02/04/2021 0834 by Adam Phenix, RN Outcome: Progressing   Problem: Health Behavior/Discharge Planning: Goal: Ability to manage health-related needs will improve 02/04/2021 1735 by Adam Phenix, RN Outcome: Adequate for Discharge 02/04/2021 0834 by Adam Phenix, RN Outcome: Progressing   Problem: Clinical Measurements: Goal: Ability to maintain clinical measurements within normal limits will improve 02/04/2021 1735 by Adam Phenix, RN Outcome: Adequate for Discharge 02/04/2021 0834 by Adam Phenix, RN Outcome: Progressing Goal: Will remain free from infection 02/04/2021 1735 by Adam Phenix, RN Outcome: Adequate for Discharge 02/04/2021 0834 by Adam Phenix, RN Outcome: Progressing Goal: Diagnostic test results will improve 02/04/2021 1735 by Adam Phenix, RN Outcome: Adequate for Discharge 02/04/2021 0834 by Adam Phenix, RN Outcome: Progressing Goal: Respiratory complications will improve 02/04/2021 1735 by Adam Phenix, RN Outcome: Adequate for Discharge 02/04/2021 0834 by Adam Phenix, RN Outcome: Progressing Goal: Cardiovascular complication will be avoided 02/04/2021 1735 by Adam Phenix, RN Outcome: Adequate for Discharge 02/04/2021 0834 by Adam Phenix, RN Outcome: Progressing   Problem: Activity: Goal: Risk for activity intolerance will decrease 02/04/2021 1735 by Adam Phenix, RN Outcome: Adequate for Discharge 02/04/2021 0834 by Adam Phenix, RN Outcome: Progressing   Problem: Nutrition: Goal: Adequate nutrition will be maintained 02/04/2021 1735 by Adam Phenix, RN Outcome:  Adequate for Discharge 02/04/2021 0834 by Adam Phenix, RN Outcome: Progressing   Problem: Coping: Goal: Level of anxiety will decrease 02/04/2021 1735 by Adam Phenix, RN Outcome: Adequate for Discharge 02/04/2021 0834 by Adam Phenix, RN Outcome: Progressing   Problem: Elimination: Goal: Will not experience complications related to bowel motility 02/04/2021 1735 by Adam Phenix, RN Outcome: Adequate for Discharge 02/04/2021 0834 by Adam Phenix, RN Outcome: Progressing Goal: Will not experience complications related to urinary retention 02/04/2021 1735 by Adam Phenix, RN Outcome: Adequate for Discharge 02/04/2021 0834 by Adam Phenix, RN Outcome: Progressing   Problem: Pain Managment: Goal: General experience of comfort will improve 02/04/2021 1735 by Adam Phenix, RN Outcome: Adequate for Discharge 02/04/2021 0834 by Adam Phenix, RN Outcome: Progressing   Problem: Safety: Goal: Ability to remain free from injury will improve 02/04/2021 1735 by Adam Phenix, RN Outcome: Adequate for Discharge 02/04/2021 0834 by Adam Phenix, RN Outcome: Progressing   Problem: Skin Integrity: Goal: Risk for impaired skin integrity will decrease 02/04/2021 1735 by Adam Phenix, RN Outcome: Adequate for Discharge 02/04/2021 0834 by Adam Phenix, RN Outcome: Progressing   Problem: Education: Goal: Knowledge of patient specific risk factors addressed and post discharge goals established will improve 02/04/2021 1735 by Adam Phenix, RN Outcome: Adequate for Discharge 02/04/2021 0834 by Adam Phenix, RN Outcome: Progressing Goal: Individualized Educational Video(s) 02/04/2021 1735 by Adam Phenix, RN Outcome: Adequate for Discharge 02/04/2021 0834 by Adam Phenix, RN Outcome: Progressing   Problem: Self-Care: Goal: Verbalization of feelings and concerns over  difficulty with self-care will improve 02/04/2021 1735 by Adam Phenix, RN Outcome: Adequate for Discharge 02/04/2021 0834 by Adam Phenix, RN Outcome: Progressing Goal: Ability to communicate needs accurately will improve 02/04/2021 1735 by Adam Phenix, RN Outcome: Adequate for Discharge 02/04/2021 0834 by Adam Phenix, RN Outcome: Progressing   Problem: Acute Rehab PT  Goals(only PT should resolve) Goal: Patient Will Transfer Sit To/From Stand Outcome: Adequate for Discharge Goal: Pt Will Transfer Bed To Chair/Chair To Bed Outcome: Adequate for Discharge Goal: Pt Will Ambulate Outcome: Adequate for Discharge Goal: Pt/caregiver will Perform Home Exercise Program Outcome: Adequate for Discharge   Problem: Acute Rehab OT Goals (only OT should resolve) Goal: Pt. Will Perform Grooming Outcome: Adequate for Discharge Goal: Pt. Will Perform Lower Body Bathing Outcome: Adequate for Discharge Goal: Pt. Will Perform Upper Body Dressing Outcome: Adequate for Discharge Goal: Pt. Will Perform Lower Body Dressing Outcome: Adequate for Discharge Goal: Pt. Will Transfer To Toilet Outcome: Adequate for Discharge Goal: Pt/Caregiver Will Perform Home Exercise Program Outcome: Adequate for Discharge

## 2021-02-04 NOTE — Care Management Obs Status (Signed)
[facility] NOTIFICATION   Patient Details  Name: Paige Ferguson MRN: [id number] Date of Birth: 24-Apr-1921   Medicare Observation Status Notification Given:  Yes    Tommy Medal 02/04/2021, 3:36 PM

## 2021-02-04 NOTE — Progress Notes (Signed)
SLP Cancellation Note  Patient Details Name: Paige Ferguson MRN: [id number] DOB: 02-Jun-1921   Cancelled treatment:       Reason Eval/Treat Not Completed: SLP screened, no needs identified, will sign off. Spoke with Pt and Pt's daughter who report all symptoms have resolved and that Pt's speech and language are at baseline. ST will sign off at this time, thank you.  Eriyah Fernando H. Roddie Mc, CCC-SLP Speech Language Pathologist    Wende Bushy 02/04/2021, 10:04 AM

## 2021-02-05 LAB — URINE CULTURE: Culture: NO GROWTH

## 2021-02-05 MED ORDER — LEVETIRACETAM 250 MG PO TABS: 250.0000 mg | ORAL_TABLET | Freq: Two times a day (BID) | ORAL | 1 refills | Status: AC

## 2021-02-05 NOTE — Discharge Instructions (Signed)
Please follow up with neurology to have EEG completed in 1-2 weeks.   Please start new medication for seizures called keppra 250 mg and take twice daily.      Transient Ischemic Attack A transient ischemic attack (TIA) causes the same symptoms as a stroke, but the symptoms go away quickly. A TIA happens when blood flow to the brain is blocked. Having a TIA means you may be at risk for a stroke. A TIA is a medical emergency. What are the causes? A TIA is caused by a blocked artery in the head or neck. This means the brain does not get the blood supply it needs. A blockage can be caused by:  Fatty buildup in an artery in the head or neck.  A blood clot.  A tear in an artery.  Irritation and swelling (inflammation) of an artery. Sometimes the cause is not known. What increases the risk? Certain things may make you more likely to have a TIA. Some of these are things that you can change, such as:  Being very overweight.  Using products that have nicotine or tobacco.  Taking birth control pills.  Not being active.  Drinking too much alcohol.  Using drugs. Health conditions that may increase your risk include:  High blood pressure.  High cholesterol.  Diabetes.  Heart disease.  A heartbeat that is not regular (atrial fibrillation).  Sickle cell disease.  Sleep problems (sleep apnea).  Long-term diseases that cause irritation and swelling.  Problems with blood clotting. Other risk factors include:  Being over the age of 31.  Being female.  Having a family history of stroke.  Having had blood clots, stroke, TIA, or heart attack in the past.  Having a history of high blood pressure when pregnant (preeclampsia).  Very bad headaches (migraines). What are the signs or symptoms? The symptoms of a TIA are like those of a stroke. They can include:  Weakness or loss of feeling in your face, arm, or leg. This often happens on one side of your body.  Trouble  walking.  Trouble moving your arms or legs.  Trouble talking or understanding what people are saying.  Problems with how you see.  Feeling dizzy.  Feeling confused.  Loss of balance or coordination.  Feeling like you may vomit (nausea) or you vomit.  Having a very bad headache. If you can, note what time you started to have symptoms. Tell your doctor.   How is this treated? The goal of treatment is to lower the risk for a stroke. This may include:  Changes to diet and lifestyle, such as getting regular exercise and stopping smoking.  Taking medicines to: ? Thin the blood. ? Lower blood pressure. ? Lower cholesterol.  Treating other health conditions, such as diabetes. If testing shows that an artery in your brain is narrow, your doctor may recommend a procedure to:  Take the blockage out of your artery (carotid endarterectomy).  Open or widen an artery in your neck (carotid angioplasty and stenting). Follow these instructions at home: Medicines  Take over-the-counter and prescription medicines only as told by your doctor.  If you were told to take aspirin or another medicine to thin your blood, take it exactly as told by your doctor. ? Taking too much of the medicine can cause bleeding. ? Taking too little of the medicine may not work to treat the problem. Eating and drinking  Eat 5 or more servings of fruits and vegetables each day.  Follow instructions  from your doctor about your diet. You may need to follow a certain diet to help lower your risk of a stroke. You may need to: ? Eat a diet that is low in fat and salt. ? Eat foods with a lot of fiber. ? Limit carbohydrates and sugar.  If you drink alcohol: ? Limit how much you have to:  0-1 drink a day for women who are not pregnant.  0-2 drinks a day for men. ? Know how much alcohol is in a drink. In the [place] one drink equals one 12 oz bottle of beer (38mL), one 5 oz glass of wine (120mL), or one 1 oz  glass of hard liquor (18mL).   General instructions  Keep a healthy weight.  Try to get at least 30 minutes of exercise on most days.  Get treatment if you have sleep problems.  Do not smoke or use any products that contain nicotine or tobacco. If you need help quitting, ask your doctor.  Do not use drugs.  Keep all follow-up visits. Where to find more information  American Stroke Association: www.stroke.org Get help right away if:  You have chest pain.  You have a heartbeat that is not regular.  You have any signs of a stroke. "BE FAST" is an easy way to remember the main warning signs: ? B - Balance. Dizziness, sudden trouble walking, or loss of balance. ? E - Eyes. Trouble seeing or a change in how you see. ? F - Face. Sudden weakness or loss of feeling of the face. The face or eyelid may droop on one side. ? A - Arms. Weakness or loss of feeling in an arm. This happens all of a sudden and most often on one side of the body. ? S - Speech. Sudden trouble speaking, slurred speech, or trouble understanding what people say. ? T - Time. Time to call emergency services. Write down what time symptoms started.  You have other signs of a stroke, such as: ? A sudden, very bad headache with no known cause. ? Feeling like you may vomit. ? Vomiting. ? A seizure. These symptoms may be an emergency. Get help right away. Call your local emergency services (911 in the [place]  Do not wait to see if the symptoms will go away.  Do not drive yourself to the hospital. Summary  A transient ischemic attack (TIA) happens when an artery in the head or neck is blocked. This causes the same symptoms as a stroke. The symptoms go away quickly.  A TIA is a medical emergency. Get help right away, even if your symptoms go away.  Having a TIA means that you may be at risk for a stroke. Taking medicines and making diet and lifestyle changes can help to prevent a stroke. This information is not  intended to replace advice given to you by your health care provider. Make sure you discuss any questions you have with your health care provider. Document Revised: 06/01/2020 Document Reviewed: 06/01/2020 Elsevier Patient Education  2021 [place].    IMPORTANT INFORMATION: PAY CLOSE ATTENTION   PHYSICIAN DISCHARGE INSTRUCTIONS  Follow with Primary care provider  Sharion Balloon, FNP  and other consultants as instructed by your Hospitalist Physician  [place] IF SYMPTOMS COME BACK, WORSEN OR NEW PROBLEM DEVELOPS   Please note: You were cared for by a hospitalist during your hospital stay. Every effort will be made to forward records to your primary  care provider.  You can request that your primary care provider send for your hospital records if they have not received them.  Once you are discharged, your primary care physician will handle any further medical issues. Please note that NO REFILLS for any discharge medications will be authorized once you are discharged, as it is imperative that you return to your primary care physician (or establish a relationship with a primary care physician if you do not have one) for your post hospital discharge needs so that they can reassess your need for medications and monitor your lab values.  Please get a complete blood count and chemistry panel checked by your Primary MD at your next visit, and again as instructed by your Primary MD.  Get Medicines reviewed and adjusted: Please take all your medications with you for your next visit with your Primary MD  Laboratory/radiological data: Please request your Primary MD to go over all hospital tests and procedure/radiological results at the follow up, please ask your primary care provider to get all Hospital records sent to his/her office.  In some cases, they will be blood work, cultures and biopsy results pending at the time of your discharge. Please request that  your primary care provider follow up on these results.  If you are diabetic, please bring your blood sugar readings with you to your follow up appointment with primary care.    Please call and make your follow up appointments as soon as possible.    Also Note the following: If you experience worsening of your admission symptoms, develop shortness of breath, life threatening emergency, suicidal or homicidal thoughts you must seek medical attention immediately by calling 911 or calling your MD immediately  if symptoms less severe.  You must read complete instructions/literature along with all the possible adverse reactions/side effects for all the Medicines you take and that have been prescribed to you. Take any new Medicines after you have completely understood and accpet all the possible adverse reactions/side effects.   Do not drive when taking Pain medications or sleeping medications (Benzodiazepines)  Do not take more than prescribed Pain, Sleep and Anxiety Medications. It is not advisable to combine anxiety,sleep and pain medications without talking with your primary care practitioner  Special Instructions: If you have smoked or chewed Tobacco  in the last 2 yrs please stop smoking, stop any regular Alcohol  and or any Recreational drug use.  Wear Seat belts while driving.  Do not drive if taking any narcotic, mind altering or controlled substances or recreational drugs or alcohol.

## 2021-02-05 NOTE — Progress Notes (Signed)
Nsg Discharge Note  Admit Date:  02/03/2021 Discharge date: 02/05/2021   Paige Ferguson to be D/C'd Home per MD order.  AVS completed.  Copy for chart, and copy for patient signed, and dated. Patient/caregiver able to verbalize understanding. IV removed. Discharge paper work given and reviewed with patient and patients daughter. DR. Wynetta Emery spoke to patients daughter regarding questions and concerns. Patient and patients daughter agreeable to discharge.   Discharge Medication: Allergies as of 02/05/2021      Reactions   Sulfa Antibiotics Nausea And Vomiting   Amoxicillin Other (See Comments)   Unknown reaction   Nitrofurantoin Monohyd Macro Other (See Comments)   Unknown reaction      Medication List    STOP taking these medications   mupirocin ointment 2 % Commonly known as: BACTROBAN     TAKE these medications   amLODipine 5 MG tablet Commonly known as: NORVASC Take 1 tablet (5 mg total) by mouth daily.   apixaban 2.5 MG Tabs tablet Commonly known as: Eliquis Take 1 tablet (2.5 mg total) by mouth 2 (two) times daily. What changed:   medication strength  how much to take   Benadryl Itch Stopping cream Generic drug: diphenhydrAMINE-zinc acetate Apply topically 3 (three) times daily as needed for itching.   benazepril 40 MG tablet Commonly known as: LOTENSIN Take 1 tablet (40 mg total) by mouth daily.   CALCIUM 1200+D3 PO Take 1 tablet by mouth daily.   carvedilol 6.25 MG tablet Commonly known as: COREG Take 1 tablet (6.25 mg total) by mouth 2 (two) times daily with a meal.   Centrum Silver Adult 50+ Tabs Take 1 tablet by mouth daily.   Fish Oil 1000 MG Caps Take 1 capsule by mouth 2 (two) times daily.   furosemide 40 MG tablet Commonly known as: LASIX Take 1 tablet (40 mg total) by mouth 2 (two) times daily. What changed: See the new instructions.   GNP Glucosame Chondroitin DS Tabs Take 1 tablet by mouth daily.   levETIRAcetam 250 MG tablet Commonly  known as: KEPPRA Take 1 tablet (250 mg total) by mouth 2 (two) times daily.   meloxicam 7.5 MG tablet Commonly known as: MOBIC Take 1 tablet (7.5 mg total) by mouth daily as needed for pain. What changed:   when to take this  reasons to take this   pantoprazole 20 MG tablet Commonly known as: PROTONIX Take 1 tablet (20 mg total) by mouth daily.   raloxifene 60 MG tablet Commonly known as: EVISTA Take 1 tablet (60 mg total) by mouth daily.   rosuvastatin 5 MG tablet Commonly known as: CRESTOR Take 0.5 tablets (2.5 mg total) by mouth 3 (three) times a week.   terbinafine 1 % cream Commonly known as: LamISIL AT Apply 1 application topically 2 (two) times daily.   vitamin E 200 UNIT capsule Take 200 Units by mouth daily.       Discharge Assessment: Vitals:   02/05/21 0552 02/05/21 0858  BP: 116/67 128/75  Pulse: 76 87  Resp: 18 19  Temp: 98 F (36.7 C)   SpO2: 97% 99%   Skin clean, dry and intact without evidence of skin break down, no evidence of skin tears noted. IV catheter discontinued intact. Site without signs and symptoms of complications - no redness or edema noted at insertion site, patient denies c/o pain - only slight tenderness at site.  Dressing with slight pressure applied.  D/c Instructions-Education: Discharge instructions given to patient/family with verbalized understanding.  D/c education completed with patient/family including follow up instructions, medication list, d/c activities limitations if indicated, with other d/c instructions as indicated by MD - patient able to verbalize understanding, all questions fully answered. Patient instructed to return to ED, call 911, or call MD for any changes in condition.  Patient escorted via [place], and D/C home via private auto.  Zenaida Deed, RN 02/05/2021 1:18 PM

## 2021-02-07 ENCOUNTER — Telehealth: Payer: Self-pay | Admitting: Family

## 2021-02-07 ENCOUNTER — Telehealth: Payer: Self-pay

## 2021-02-07 ENCOUNTER — Telehealth: Payer: Self-pay | Admitting: Cardiovascular Disease

## 2021-02-07 ENCOUNTER — Other Ambulatory Visit: Payer: Self-pay | Admitting: Family Medicine

## 2021-02-07 NOTE — Telephone Encounter (Signed)
Pt's daughter is calling about a hospital f/u but said that she can't come into the office and wants to discuss what she can do to still have the f/u appt

## 2021-02-07 NOTE — Telephone Encounter (Signed)
I reviewed then notes from the hospital and her medications and would not change anything right now. I do not think she needs a follow up office visit right now. We can plan a virtual visit in several months or sooner if needed. Thanks, Gerald Stabs

## 2021-02-07 NOTE — Telephone Encounter (Signed)
Spoke with daughter and made her aware of information from Dr. Angelena Form.  Scheduled pt to come in to see Dr. Angelena Form in August.  Daughter is aware to call for sooner appt if needed or if we need to switch this one to virtual.  Daughter appreciative for call.

## 2021-02-07 NOTE — Telephone Encounter (Signed)
Contact Date: 02/07/2021 Contacted By: Nigel Berthold Transition Care Management Follow-up Telephone Call  Date of discharge and from where: 02/05/21 [facility]   Discharge Diagnosis:TIA  How have you been since you were released from the hospital? Daughter states patient is better but still not all the way her self.   Any questions or concerns? No   Items Reviewed:  Did the pt receive and understand the discharge instructions provided? Yes   Medications obtained and verified? Yes   Any new allergies since your discharge? No   Dietary orders reviewed? Yes  Do you have support at home? Yes   Discontinued Medications STOP taking these medications   mupirocin ointment 2 % Commonly known as: BACTROBAN    New Medications Added   levETIRAcetam (KEPPRA) 250 MG tablet     Current Medication List Allergies as of 02/07/2021      Reactions   Sulfa Antibiotics Nausea And Vomiting   Amoxicillin Other (See Comments)   Unknown reaction   Nitrofurantoin Monohyd Macro Other (See Comments)   Unknown reaction      Medication List       Accurate as of February 07, 2021 12:03 PM. If you have any questions, ask your nurse or doctor.        amLODipine 5 MG tablet Commonly known as: NORVASC Take 1 tablet (5 mg total) by mouth daily.   apixaban 2.5 MG Tabs tablet Commonly known as: Eliquis Take 1 tablet (2.5 mg total) by mouth 2 (two) times daily.   Benadryl Itch Stopping cream Generic drug: diphenhydrAMINE-zinc acetate Apply topically 3 (three) times daily as needed for itching.   benazepril 40 MG tablet Commonly known as: LOTENSIN Take 1 tablet (40 mg total) by mouth daily.   CALCIUM 1200+D3 PO Take 1 tablet by mouth daily.   carvedilol 6.25 MG tablet Commonly known as: COREG Take 1 tablet (6.25 mg total) by mouth 2 (two) times daily with a meal.   Centrum Silver Adult 50+ Tabs Take 1 tablet by mouth daily.   Fish Oil 1000 MG Caps Take 1 capsule by  mouth 2 (two) times daily.   furosemide 40 MG tablet Commonly known as: LASIX Take 1 tablet (40 mg total) by mouth 2 (two) times daily.   GNP Glucosame Chondroitin DS Tabs Take 1 tablet by mouth daily.   levETIRAcetam 250 MG tablet Commonly known as: KEPPRA Take 1 tablet (250 mg total) by mouth 2 (two) times daily.   meloxicam 7.5 MG tablet Commonly known as: MOBIC Take 1 tablet (7.5 mg total) by mouth daily as needed for pain.   pantoprazole 20 MG tablet Commonly known as: PROTONIX Take 1 tablet (20 mg total) by mouth daily.   raloxifene 60 MG tablet Commonly known as: EVISTA Take 1 tablet (60 mg total) by mouth daily.   rosuvastatin 5 MG tablet Commonly known as: CRESTOR Take 0.5 tablets (2.5 mg total) by mouth 3 (three) times a week.   terbinafine 1 % cream Commonly known as: LamISIL AT Apply 1 application topically 2 (two) times daily.   vitamin E 200 UNIT capsule Take 200 Units by mouth daily.        Home Care and Equipment/Supplies: Were home health services ordered? yes If so, what is the name of the agency? Have not received call yet  Has the agency set up a time to come to the patient's home? no Were any new equipment or medical supplies ordered?  No What is the name of  the medical supply agency? [place] Were you able to get the supplies/equipment? not applicable Do you have any questions related to the use of the equipment or supplies? No  Functional Questionnaire: (I = Independent and D = Dependent) ADLs: D  Bathing/Dressing- D  Meal Prep- D  Eating- I  Maintaining continence- I  Transferring/Ambulation- I  Managing Meds- D  Follow up appointments reviewed:   PCP Hospital f/u appt confirmed? Yes  Scheduled to see Evelina Dun on 02/10/21 @ 2:10pm.  [facility] f/u appt confirmed? No    Are transportation arrangements needed? No   If their condition worsens, is the pt aware to call PCP or go to the Emergency Dept.?  Yes  Was the patient provided with contact information for the PCP's office or ED? Yes  Was to pt encouraged to call back with questions or concerns? Yes

## 2021-02-07 NOTE — Telephone Encounter (Signed)
Patient's daughter states the patient was recently admitted due to having several seizures. The seizure medication is putting the patient to sleep and making it difficult to transport the patient. The patient needs a hospital follow-up, but she states she may not be able to get the patient into the office. She is requesting to have Dr. Angelena Form review her ED notes.

## 2021-02-07 NOTE — Progress Notes (Deleted)
Follow-Up Visit   Subjective  Paige Ferguson is a 87 y.o. female who presents for the following: Skin Problem (Patient here today for places on her back x years the places are painful at times when touch, itching and drainage. Check place on right upper arm x years it did drain one time no pain, it does itch.).   The following portions of the chart were reviewed this encounter and updated as appropriate:      Objective  Well appearing patient in no apparent distress; mood and affect are within normal limits.  All skin waist up examined.  Objective  waist up: Waist up skin examination  Objective  Right Lower Back: Dark raised & irriated     Objective  Right Flank: Dark rasied & irritated      Objective  Left Flank: Raised  & irritated      Objective  Left Upper Back: Benign Stuck-on, waxy, tan-brown papules and plaques. --Discussed benign etiology and prognosis.   Objective  Left Ala Nasi, Left Temple, Right Malar Cheek, Right Nasal Sidewall: Erythematous patches with gritty scale.  Objective  Right Upper Arm - Anterior: Pink pearly papule     Assessment & Plan  Encounter for screening for malignant neoplasm of skin waist up  Yearly skin check  Neoplasm of uncertain behavior of skin (3) Right Lower Back  Skin / nail biopsy Type of biopsy: tangential   Informed consent: discussed and consent obtained   Timeout: patient name, date of birth, surgical site, and procedure verified   Procedure prep:  Patient was prepped and draped in usual sterile fashion (Non sterile) Prep type:  Chlorhexidine Anesthesia: the lesion was anesthetized in a standard fashion   Anesthetic:  1% lidocaine w/ epinephrine 1-100,000 local infiltration Instrument used: flexible razor blade   Outcome: patient tolerated procedure well   Post-procedure details: wound care instructions given    Specimen 2 - Surgical pathology Differential Diagnosis: r/o atypia,sk  Check  Margins: No  Right Flank  Skin / nail biopsy Type of biopsy: tangential   Informed consent: discussed and consent obtained   Timeout: patient name, date of birth, surgical site, and procedure verified   Procedure prep:  Patient was prepped and draped in usual sterile fashion (Non sterile) Prep type:  Chlorhexidine Anesthesia: the lesion was anesthetized in a standard fashion   Anesthetic:  1% lidocaine w/ epinephrine 1-100,000 local infiltration Instrument used: flexible razor blade   Outcome: patient tolerated procedure well   Post-procedure details: wound care instructions given    Specimen 3 - Surgical pathology Differential Diagnosis: r/o sk, atypia  Check Margins: No  Left Flank  Skin / nail biopsy  Specimen 4 - Surgical pathology Differential Diagnosis: r/o sk  Check Margins: No  Seborrheic keratosis Left Upper Back  Skin / nail biopsy - Left Upper Back Type of biopsy: tangential   Informed consent: discussed and consent obtained   Timeout: patient name, date of birth, surgical site, and procedure verified   Procedure prep:  Patient was prepped and draped in usual sterile fashion (Non sterile) Prep type:  Chlorhexidine Anesthesia: the lesion was anesthetized in a standard fashion   Anesthetic:  1% lidocaine w/ epinephrine 1-100,000 local infiltration Instrument used: flexible razor blade   Outcome: patient tolerated procedure well   Post-procedure details: wound care instructions given    AK (actinic keratosis) (4) Left Temple; Right Nasal Sidewall; Left Ala Nasi; Right Malar Cheek  Destruction of lesion - Left Ala Nasi, Left  Temple, Right Malar Cheek, Right Nasal Sidewall Complexity: simple   Destruction method: cryotherapy   Informed consent: discussed and consent obtained   Timeout:  patient name, date of birth, surgical site, and procedure verified Lesion destroyed using liquid nitrogen: Yes   Cryotherapy cycles:  3 Outcome: patient tolerated procedure  well with no complications    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  Skin / nail biopsy Type of biopsy: tangential   Informed consent: discussed and consent obtained   Timeout: patient name, date of birth, surgical site, and procedure verified   Procedure prep:  Patient was prepped and draped in usual sterile fashion (Non sterile) Prep type:  Chlorhexidine Anesthesia: the lesion was anesthetized in a standard fashion   Anesthetic:  1% lidocaine w/ epinephrine 1-100,000 local infiltration Instrument used: flexible razor blade   Outcome: patient tolerated procedure well   Post-procedure details: wound care instructions given    Destruction of lesion Complexity: simple   Destruction method: electrodesiccation and curettage   Informed consent: discussed and consent obtained   Timeout:  patient name, date of birth, surgical site, and procedure verified Anesthesia: the lesion was anesthetized in a standard fashion   Anesthetic:  1% lidocaine w/ epinephrine 1-100,000 local infiltration Curettage performed in three different directions: Yes   Curettage cycles:  1 Margin per side (cm):  0.1 Final wound size (cm):  1.3 Hemostasis achieved with:  aluminum chloride Outcome: patient tolerated procedure well with no complications   Post-procedure details: wound care instructions given    Specimen 1 - Surgical pathology Differential Diagnosis: bcc vs scc  Check Margins: No   I, SHEFFIELD,KELLI, PA-C, have reviewed all documentation's for this visit.  The documentation on 02/07/21 for the exam, diagnosis, procedures and orders are all accurate and complete.

## 2021-02-07 NOTE — Telephone Encounter (Signed)
Daughter would like for Dr. Angelena Form to review hospitalization and see if pt needs appt.  Looks like she was advised to f/u here in 2 weeks for medication check.  Daughter says hard to transport pt.  If appt needed, ok to do virtual?

## 2021-02-09 DIAGNOSIS — R609 Edema, unspecified: Secondary | ICD-10-CM | POA: Diagnosis not present

## 2021-02-09 DIAGNOSIS — R0602 Shortness of breath: Secondary | ICD-10-CM | POA: Diagnosis not present

## 2021-02-09 DIAGNOSIS — I509 Heart failure, unspecified: Secondary | ICD-10-CM | POA: Diagnosis not present

## 2021-02-10 ENCOUNTER — Telehealth (INDEPENDENT_AMBULATORY_CARE_PROVIDER_SITE_OTHER): Payer: Medicare Other | Admitting: Family

## 2021-02-10 ENCOUNTER — Encounter: Payer: Self-pay | Admitting: Family

## 2021-02-10 DIAGNOSIS — Z09 Encounter for follow-up examination after completed treatment for conditions other than malignant neoplasm: Secondary | ICD-10-CM

## 2021-02-10 DIAGNOSIS — G40909 Epilepsy, unspecified, not intractable, without status epilepticus: Secondary | ICD-10-CM

## 2021-02-10 DIAGNOSIS — G459 Transient cerebral ischemic attack, unspecified: Secondary | ICD-10-CM | POA: Diagnosis not present

## 2021-02-10 DIAGNOSIS — Z9981 Dependence on supplemental oxygen: Secondary | ICD-10-CM | POA: Diagnosis not present

## 2021-02-10 DIAGNOSIS — R531 Weakness: Secondary | ICD-10-CM | POA: Diagnosis not present

## 2021-02-10 MED ORDER — PANTOPRAZOLE SODIUM 20 MG PO TBEC: 20.0000 mg | DELAYED_RELEASE_TABLET | Freq: Every day | ORAL | 3 refills | Status: DC

## 2021-02-10 NOTE — Progress Notes (Signed)
Virtual Visit  Note Due to COVID-19 pandemic this visit was conducted virtually. This visit type was conducted due to national recommendations for restrictions regarding the COVID-19 Pandemic (e.g. social distancing, sheltering in place) in an effort to limit this patient's exposure and mitigate transmission in our community. All issues noted in this document were discussed and addressed.  A physical exam was not performed with this format.  I connected with Paige Ferguson on 02/10/21 at 2:14 pm  by video and verified that I am speaking with the correct person using two identifiers. Paige Ferguson is currently located at home and grandson is currently with her during visit. The provider, Evelina Dun, FNP is located in their office at time of visit.  I discussed the limitations, risks, security and privacy concerns of performing an evaluation and management service by video  and the availability of in person appointments. I also discussed with the patient that there may be a patient responsible charge related to this service. The patient expressed understanding and agreed to proceed.   History and Present Illness:  HPI  Today's visit was for Transitional Care Management.  The patient was discharged from [facility] on 02/05/21 with a primary diagnosis of TIA.   Contact with the patient and/or caregiver, by a clinical staff member, was made on 02/07/21 and was documented as a telephone encounter within the EMR.  Through chart review and discussion with the patient I have determined that management of their condition is of moderate complexity.   She thought to have a TIA vs a seizure. She has a follow up with the Neurologists 02/18/21.    She had a CT head with no acute findings.  Patient had an MRI and MRA of the brain with chronic findings but no acute findings.  2D echocardiogram noted below with no significant findings to explain her current symptoms.  Her symptoms have resolved and no  recurrence at this time.  PT evaluation completed with findings of no further PT recommended.   She has home health ordered, but has not came out yet.    Pt was seen by neurologist Dr. Merlene Laughter and he was concerned patient was having seizures and this is likely causing her symptoms.  He has started her on keppra 250 mg BID and ordered an EEG.  This medication caused extreme drowsiness and is only taking 1/2 tab BID.    She continues to have mild weakness.   Review of Systems  Constitutional: Positive for malaise/fatigue.  All other systems reviewed and are negative.    Observations/Objective: No SOB or distress noted, patient looks slightly weak and using oxygen  Assessment and Plan: 1. Hospital discharge follow-up  2. On home oxygen therapy  3. TIA (transient ischemic attack)  4. Seizure disorder [facility])  - Ambulatory referral to Neurology  Labs reviewed from hospital  Referral to Neurologists pending  Continue current medications Encourage ROM exercises    I discussed the assessment and treatment plan with the patient. The patient was provided an opportunity to ask questions and all were answered. The patient agreed with the plan and demonstrated an understanding of the instructions.   The patient was advised to call back or seek an in-person evaluation if the symptoms worsen or if the condition fails to improve as anticipated.  The above assessment and management plan was discussed with the patient. The patient verbalized understanding of and has agreed to the management plan. Patient is aware to call the clinic if  symptoms persist or worsen. Patient is aware when to return to the clinic for a follow-up visit. Patient educated on when it is appropriate to go to the emergency department.   Time call ended:  2:47 pm   I provided  13 minutes of   face-to-face time during this encounter.    Evelina Dun, FNP

## 2021-02-16 ENCOUNTER — Ambulatory Visit (HOSPITAL_COMMUNITY): Payer: Medicare Other | Attending: Family Medicine

## 2021-02-16 ENCOUNTER — Ambulatory Visit (HOSPITAL_COMMUNITY): Payer: Medicare Other

## 2021-02-16 ENCOUNTER — Ambulatory Visit (HOSPITAL_COMMUNITY): Admission: RE | Admit: 2021-02-16 | Payer: Medicare Other | Source: Ambulatory Visit

## 2021-02-16 NOTE — Telephone Encounter (Signed)
Phone call to patient with her pathology results. Voicemail left for patient to give the office a call back.  

## 2021-02-16 NOTE — Telephone Encounter (Signed)
-----   Message from Warren Danes, [place] sent at 02/03/2021  8:01 AM EDT ----- 1 -30 excision 3,4  - 30 2 weeks after first surgery at suture removal

## 2021-02-17 DIAGNOSIS — I10 Essential (primary) hypertension: Secondary | ICD-10-CM | POA: Diagnosis not present

## 2021-02-17 DIAGNOSIS — Z8673 Personal history of transient ischemic attack (TIA), and cerebral infarction without residual deficits: Secondary | ICD-10-CM | POA: Diagnosis not present

## 2021-02-17 DIAGNOSIS — R531 Weakness: Secondary | ICD-10-CM | POA: Diagnosis not present

## 2021-02-17 DIAGNOSIS — Z9981 Dependence on supplemental oxygen: Secondary | ICD-10-CM | POA: Diagnosis not present

## 2021-02-17 DIAGNOSIS — I4891 Unspecified atrial fibrillation: Secondary | ICD-10-CM | POA: Diagnosis not present

## 2021-02-17 DIAGNOSIS — E785 Hyperlipidemia, unspecified: Secondary | ICD-10-CM | POA: Diagnosis not present

## 2021-02-17 NOTE — Telephone Encounter (Signed)
Phone call from patient's grandson [name] returning our call. Patient's pathology results given to patient's grandson and appointment scheduled.

## 2021-02-17 NOTE — Telephone Encounter (Signed)
-----   Message from Warren Danes, [place] sent at 02/03/2021  8:01 AM EDT ----- 1 -30 excision 3,4  - 30 2 weeks after first surgery at suture removal

## 2021-02-18 DIAGNOSIS — Z8673 Personal history of transient ischemic attack (TIA), and cerebral infarction without residual deficits: Secondary | ICD-10-CM | POA: Diagnosis not present

## 2021-02-18 DIAGNOSIS — Z9981 Dependence on supplemental oxygen: Secondary | ICD-10-CM | POA: Diagnosis not present

## 2021-02-18 DIAGNOSIS — I10 Essential (primary) hypertension: Secondary | ICD-10-CM | POA: Diagnosis not present

## 2021-02-18 DIAGNOSIS — R531 Weakness: Secondary | ICD-10-CM | POA: Diagnosis not present

## 2021-02-18 DIAGNOSIS — E785 Hyperlipidemia, unspecified: Secondary | ICD-10-CM | POA: Diagnosis not present

## 2021-02-18 DIAGNOSIS — I4891 Unspecified atrial fibrillation: Secondary | ICD-10-CM | POA: Diagnosis not present

## 2021-02-21 DIAGNOSIS — R531 Weakness: Secondary | ICD-10-CM | POA: Diagnosis not present

## 2021-02-21 DIAGNOSIS — I10 Essential (primary) hypertension: Secondary | ICD-10-CM | POA: Diagnosis not present

## 2021-02-21 DIAGNOSIS — Z8673 Personal history of transient ischemic attack (TIA), and cerebral infarction without residual deficits: Secondary | ICD-10-CM | POA: Diagnosis not present

## 2021-02-21 DIAGNOSIS — I4891 Unspecified atrial fibrillation: Secondary | ICD-10-CM | POA: Diagnosis not present

## 2021-02-21 DIAGNOSIS — Z9981 Dependence on supplemental oxygen: Secondary | ICD-10-CM | POA: Diagnosis not present

## 2021-02-21 DIAGNOSIS — E785 Hyperlipidemia, unspecified: Secondary | ICD-10-CM | POA: Diagnosis not present

## 2021-02-23 DIAGNOSIS — I10 Essential (primary) hypertension: Secondary | ICD-10-CM | POA: Diagnosis not present

## 2021-02-23 DIAGNOSIS — I4891 Unspecified atrial fibrillation: Secondary | ICD-10-CM | POA: Diagnosis not present

## 2021-02-23 DIAGNOSIS — R531 Weakness: Secondary | ICD-10-CM | POA: Diagnosis not present

## 2021-02-23 DIAGNOSIS — Z8673 Personal history of transient ischemic attack (TIA), and cerebral infarction without residual deficits: Secondary | ICD-10-CM | POA: Diagnosis not present

## 2021-02-23 DIAGNOSIS — E785 Hyperlipidemia, unspecified: Secondary | ICD-10-CM | POA: Diagnosis not present

## 2021-02-23 DIAGNOSIS — Z9981 Dependence on supplemental oxygen: Secondary | ICD-10-CM | POA: Diagnosis not present

## 2021-02-28 ENCOUNTER — Other Ambulatory Visit: Payer: Self-pay

## 2021-02-28 ENCOUNTER — Encounter: Payer: Self-pay | Admitting: Physician Assistant

## 2021-02-28 ENCOUNTER — Ambulatory Visit (INDEPENDENT_AMBULATORY_CARE_PROVIDER_SITE_OTHER): Payer: Medicare Other | Admitting: Physician Assistant

## 2021-02-28 DIAGNOSIS — D485 Neoplasm of uncertain behavior of skin: Secondary | ICD-10-CM

## 2021-02-28 DIAGNOSIS — C44529 Squamous cell carcinoma of skin of other part of trunk: Secondary | ICD-10-CM | POA: Diagnosis not present

## 2021-02-28 DIAGNOSIS — L988 Other specified disorders of the skin and subcutaneous tissue: Secondary | ICD-10-CM | POA: Diagnosis not present

## 2021-02-28 DIAGNOSIS — D492 Neoplasm of unspecified behavior of bone, soft tissue, and skin: Secondary | ICD-10-CM

## 2021-02-28 DIAGNOSIS — C4492 Squamous cell carcinoma of skin, unspecified: Secondary | ICD-10-CM

## 2021-02-28 NOTE — Patient Instructions (Signed)

## 2021-02-28 NOTE — Progress Notes (Signed)
   Follow-Up Visit   Subjective  Paige Ferguson is a 87 y.o. female who presents for the following: Procedure treat 3 skin cancers.   The following portions of the chart were reviewed this encounter and updated as appropriate:  Tobacco  Allergies  Meds  Problems  Med Hx  Surg Hx  Fam Hx      Objective  Well appearing patient in no apparent distress; mood and affect are within normal limits.  All sun exposed areas plus back examined.  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  Destruction of lesion Complexity: simple   Destruction method: electrodesiccation and curettage   Informed consent: discussed and consent obtained   Timeout:  patient name, date of birth, surgical site, and procedure verified Anesthesia: the lesion was anesthetized in a standard fashion   Anesthetic:  1% lidocaine w/ epinephrine 1-100,000 local infiltration Curettage performed in three different directions: Yes   Curettage cycles:  3 Final wound size (cm):  1.5 Hemostasis achieved with:  ferric subsulfate Outcome: patient tolerated procedure well with no complications   Additional details:  Wound innoculated with 5 fluorouracil solution.  Left Flank  Destruction of lesion Complexity: simple   Destruction method: electrodesiccation and curettage   Informed consent: discussed and consent obtained   Timeout:  patient name, date of birth, surgical site, and procedure verified Anesthesia: the lesion was anesthetized in a standard fashion   Anesthetic:  1% lidocaine w/ epinephrine 1-100,000 local infiltration Curettage performed in three different directions: Yes   Curettage cycles:  3 Final wound size (cm):  2.3 Hemostasis achieved with:  ferric subsulfate Outcome: patient tolerated procedure well  with no complications   Additional details:  Wound innoculated with 5 fluorouracil solution.  Neoplasm of skin Right Upper Arm - Anterior  Epidermal / dermal shaving  Lesion diameter (cm):  1.3 Informed consent: discussed and consent obtained   Timeout: patient name, date of birth, surgical site, and procedure verified   Procedure prep:  Patient was prepped and draped in usual sterile fashion Prep type:  Chlorhexidine Anesthesia: the lesion was anesthetized in a standard fashion   Anesthetic:  1% lidocaine w/ epinephrine 1-100,000 local infiltration Instrument used: DermaBlade   Hemostasis achieved with: aluminum chloride   Outcome: patient tolerated procedure well   Post-procedure details: sterile dressing applied and wound care instructions given   Dressing type: petrolatum and bandage    Specimen 1 - Surgical pathology Differential Diagnosis: sebaceous carinoma- PQ [id number]  Check Margins: yes   I, Valrie Jia, PA-C, have reviewed all documentation's for this visit.  The documentation on 02/28/21 for the exam, diagnosis, procedures and orders are all accurate and complete.

## 2021-03-01 ENCOUNTER — Telehealth: Payer: Medicare Other

## 2021-03-01 DIAGNOSIS — R531 Weakness: Secondary | ICD-10-CM | POA: Diagnosis not present

## 2021-03-01 DIAGNOSIS — Z9981 Dependence on supplemental oxygen: Secondary | ICD-10-CM | POA: Diagnosis not present

## 2021-03-01 DIAGNOSIS — E785 Hyperlipidemia, unspecified: Secondary | ICD-10-CM | POA: Diagnosis not present

## 2021-03-01 DIAGNOSIS — I10 Essential (primary) hypertension: Secondary | ICD-10-CM | POA: Diagnosis not present

## 2021-03-01 DIAGNOSIS — Z8673 Personal history of transient ischemic attack (TIA), and cerebral infarction without residual deficits: Secondary | ICD-10-CM | POA: Diagnosis not present

## 2021-03-01 DIAGNOSIS — I4891 Unspecified atrial fibrillation: Secondary | ICD-10-CM | POA: Diagnosis not present

## 2021-03-03 DIAGNOSIS — Z8673 Personal history of transient ischemic attack (TIA), and cerebral infarction without residual deficits: Secondary | ICD-10-CM | POA: Diagnosis not present

## 2021-03-03 DIAGNOSIS — R531 Weakness: Secondary | ICD-10-CM | POA: Diagnosis not present

## 2021-03-03 DIAGNOSIS — I10 Essential (primary) hypertension: Secondary | ICD-10-CM | POA: Diagnosis not present

## 2021-03-03 DIAGNOSIS — R0602 Shortness of breath: Secondary | ICD-10-CM | POA: Diagnosis not present

## 2021-03-03 DIAGNOSIS — E785 Hyperlipidemia, unspecified: Secondary | ICD-10-CM | POA: Diagnosis not present

## 2021-03-03 DIAGNOSIS — I4891 Unspecified atrial fibrillation: Secondary | ICD-10-CM | POA: Diagnosis not present

## 2021-03-03 DIAGNOSIS — I509 Heart failure, unspecified: Secondary | ICD-10-CM | POA: Diagnosis not present

## 2021-03-03 DIAGNOSIS — Z9981 Dependence on supplemental oxygen: Secondary | ICD-10-CM | POA: Diagnosis not present

## 2021-03-09 ENCOUNTER — Encounter: Payer: Self-pay | Admitting: Physician Assistant

## 2021-03-09 DIAGNOSIS — G250 Essential tremor: Secondary | ICD-10-CM | POA: Diagnosis not present

## 2021-03-09 DIAGNOSIS — R2689 Other abnormalities of gait and mobility: Secondary | ICD-10-CM | POA: Diagnosis not present

## 2021-03-09 DIAGNOSIS — M130 Polyarthritis, unspecified: Secondary | ICD-10-CM | POA: Diagnosis not present

## 2021-03-09 NOTE — Addendum Note (Signed)
Addended by: Warren Danes on: 03/09/2021 06:19 PM   Modules accepted: Orders

## 2021-03-09 NOTE — Progress Notes (Signed)
Follow-Up Visit   Subjective  Paige Ferguson is a 87 y.o. female who presents for the following: Skin Problem (Patient here today for places on her back x years the places are painful at times when touch, itching and drainage. Check place on right upper arm x years it did drain one time no pain, it does itch.).   The following portions of the chart were reviewed this encounter and updated as appropriate:      Objective  Well appearing patient in no apparent distress; mood and affect are within normal limits.  All skin waist up examined.  Objective  Right Lower Back: Dark raised & irriated     Objective  Right Flank: Dark rasied & irritated      Objective  Left Flank: Raised  & irritated      Objective  Left Ala Nasi, Left Temple, Right Malar Cheek, Right Nasal Sidewall: Erythematous patches with gritty scale.  Objective  Right Upper Arm - Anterior: Pink pearly papule     Objective  Left Upper Back: Benign Stuck-on, waxy, tan-brown papules and plaques. --Discussed benign etiology and prognosis.    Assessment & Plan  Neoplasm of uncertain behavior of skin (3) Right Lower Back  Skin / nail biopsy Type of biopsy: tangential   Informed consent: discussed and consent obtained   Timeout: patient name, date of birth, surgical site, and procedure verified   Procedure prep:  Patient was prepped and draped in usual sterile fashion (Non sterile) Prep type:  Chlorhexidine Anesthesia: the lesion was anesthetized in a standard fashion   Anesthetic:  1% lidocaine w/ epinephrine 1-100,000 local infiltration Instrument used: flexible razor blade   Outcome: patient tolerated procedure well   Post-procedure details: wound care instructions given    Specimen 2 - Surgical pathology Differential Diagnosis: r/o atypia,sk  Check Margins: No  Right Flank  Skin / nail biopsy Type of biopsy: tangential   Informed consent: discussed and consent obtained   Timeout:  patient name, date of birth, surgical site, and procedure verified   Procedure prep:  Patient was prepped and draped in usual sterile fashion (Non sterile) Prep type:  Chlorhexidine Anesthesia: the lesion was anesthetized in a standard fashion   Anesthetic:  1% lidocaine w/ epinephrine 1-100,000 local infiltration Instrument used: flexible razor blade   Outcome: patient tolerated procedure well   Post-procedure details: wound care instructions given    Specimen 3 - Surgical pathology Differential Diagnosis: r/o sk, atypia  Check Margins: No  Left Flank  Skin / nail biopsy Type of biopsy: tangential   Informed consent: discussed and consent obtained   Timeout: patient name, date of birth, surgical site, and procedure verified   Anesthesia: the lesion was anesthetized in a standard fashion   Anesthetic:  1% lidocaine w/ epinephrine 1-100,000 local infiltration Instrument used: flexible razor blade   Hemostasis achieved with: aluminum chloride and electrodesiccation   Outcome: patient tolerated procedure well   Post-procedure details: wound care instructions given    Specimen 4 - Surgical pathology Differential Diagnosis: r/o sk  Check Margins: No  AK (actinic keratosis) (4) Left Temple; Right Nasal Sidewall; Left Ala Nasi; Right Malar Cheek  Destruction of lesion - Left Ala Nasi, Left Temple, Right Malar Cheek, Right Nasal Sidewall Complexity: simple   Destruction method: cryotherapy   Informed consent: discussed and consent obtained   Timeout:  patient name, date of birth, surgical site, and procedure verified Lesion destroyed using liquid nitrogen: Yes   Cryotherapy cycles:  3 Outcome: patient tolerated procedure well with no complications    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  Skin / nail biopsy Type of biopsy: tangential   Informed consent: discussed and consent obtained   Timeout: patient name, date of birth, surgical site, and procedure verified    Procedure prep:  Patient was prepped and draped in usual sterile fashion (Non sterile) Prep type:  Chlorhexidine Anesthesia: the lesion was anesthetized in a standard fashion   Anesthetic:  1% lidocaine w/ epinephrine 1-100,000 local infiltration Instrument used: flexible razor blade   Outcome: patient tolerated procedure well   Post-procedure details: wound care instructions given    Destruction of lesion Complexity: simple   Destruction method: electrodesiccation and curettage   Informed consent: discussed and consent obtained   Timeout:  patient name, date of birth, surgical site, and procedure verified Anesthesia: the lesion was anesthetized in a standard fashion   Anesthetic:  1% lidocaine w/ epinephrine 1-100,000 local infiltration Curettage performed in three different directions: Yes   Curettage cycles:  1 Margin per side (cm):  0.1 Final wound size (cm):  1.3 Hemostasis achieved with:  aluminum chloride Outcome: patient tolerated procedure well with no complications   Post-procedure details: wound care instructions given    Specimen 1 - Surgical pathology Differential Diagnosis: bcc vs scc  Check Margins: No  Inflamed seborrheic keratosis Left Upper Back  Destruction of lesion - Left Upper Back Complexity: simple   Destruction method: cryotherapy   Informed consent: discussed and consent obtained   Timeout:  patient name, date of birth, surgical site, and procedure verified Lesion destroyed using liquid nitrogen: Yes   Cryotherapy cycles:  1 Outcome: patient tolerated procedure well with no complications   Post-procedure details: wound care instructions given      I, November Sypher, PA-C, have reviewed all documentation's for this visit.  The documentation on 03/09/21 for the exam, diagnosis, procedures and orders are all accurate and complete.

## 2021-03-12 DIAGNOSIS — R0602 Shortness of breath: Secondary | ICD-10-CM | POA: Diagnosis not present

## 2021-03-12 DIAGNOSIS — R609 Edema, unspecified: Secondary | ICD-10-CM | POA: Diagnosis not present

## 2021-03-12 DIAGNOSIS — I509 Heart failure, unspecified: Secondary | ICD-10-CM | POA: Diagnosis not present

## 2021-03-15 ENCOUNTER — Telehealth: Payer: Self-pay | Admitting: *Deleted

## 2021-03-15 NOTE — Telephone Encounter (Signed)
Pathology to patient daughter Raquel Sarna.

## 2021-03-15 NOTE — Telephone Encounter (Signed)
-----   Message from Warren Danes, [place] sent at 03/15/2021 12:44 PM EDT ----- Margins free - no Cancer

## 2021-04-02 DIAGNOSIS — I509 Heart failure, unspecified: Secondary | ICD-10-CM | POA: Diagnosis not present

## 2021-04-02 DIAGNOSIS — R0602 Shortness of breath: Secondary | ICD-10-CM | POA: Diagnosis not present

## 2021-04-11 DIAGNOSIS — R609 Edema, unspecified: Secondary | ICD-10-CM | POA: Diagnosis not present

## 2021-04-11 DIAGNOSIS — R0602 Shortness of breath: Secondary | ICD-10-CM | POA: Diagnosis not present

## 2021-04-11 DIAGNOSIS — I509 Heart failure, unspecified: Secondary | ICD-10-CM | POA: Diagnosis not present

## 2021-04-12 ENCOUNTER — Telehealth: Payer: Medicare Other

## 2021-04-15 ENCOUNTER — Other Ambulatory Visit: Payer: Self-pay | Admitting: Family

## 2021-04-19 ENCOUNTER — Ambulatory Visit: Payer: Medicare Other | Admitting: Family

## 2021-04-20 ENCOUNTER — Encounter: Payer: Self-pay | Admitting: Family

## 2021-04-20 ENCOUNTER — Ambulatory Visit (INDEPENDENT_AMBULATORY_CARE_PROVIDER_SITE_OTHER): Payer: Medicare Other | Admitting: Family

## 2021-04-20 VITALS — BP 128/80

## 2021-04-20 DIAGNOSIS — M1712 Unilateral primary osteoarthritis, left knee: Secondary | ICD-10-CM | POA: Diagnosis not present

## 2021-04-20 DIAGNOSIS — I10 Essential (primary) hypertension: Secondary | ICD-10-CM

## 2021-04-20 DIAGNOSIS — M170 Bilateral primary osteoarthritis of knee: Secondary | ICD-10-CM | POA: Diagnosis not present

## 2021-04-20 DIAGNOSIS — I509 Heart failure, unspecified: Secondary | ICD-10-CM

## 2021-04-20 DIAGNOSIS — E782 Mixed hyperlipidemia: Secondary | ICD-10-CM

## 2021-04-20 DIAGNOSIS — I4891 Unspecified atrial fibrillation: Secondary | ICD-10-CM

## 2021-04-20 DIAGNOSIS — E669 Obesity, unspecified: Secondary | ICD-10-CM

## 2021-04-20 DIAGNOSIS — Z9981 Dependence on supplemental oxygen: Secondary | ICD-10-CM

## 2021-04-20 MED ORDER — ROSUVASTATIN CALCIUM 5 MG PO TABS: 2.5000 mg | ORAL_TABLET | ORAL | 1 refills | Status: AC

## 2021-04-20 MED ORDER — MELOXICAM 7.5 MG PO TABS: 7.5000 mg | ORAL_TABLET | Freq: Every day | ORAL | Status: DC | PRN

## 2021-04-20 NOTE — Progress Notes (Signed)
Virtual Visit  Note Due to COVID-19 pandemic this visit was conducted virtually. This visit type was conducted due to national recommendations for restrictions regarding the COVID-19 Pandemic (e.g. social distancing, sheltering in place) in an effort to limit this patient's exposure and mitigate transmission in our community. All issues noted in this document were discussed and addressed.  A physical exam was not performed with this format.  I connected with Paige Ferguson on 04/20/21 at 11:50 AM  by telephone and verified that I am speaking with the correct person using two identifiers. Paige Ferguson is currently located at home and her grandson  is currently with her during visit. The provider, Evelina Dun, FNP is located in their office at time of visit.  I discussed the limitations, risks, security and privacy concerns of performing an evaluation and management service by telephone and the availability of in person appointments. I also discussed with the patient that there may be a patient responsible charge related to this service. The patient expressed understanding and agreed to proceed.   History and Present Illness:  PT calls the office today for chronic follow up. PT is currently on 3L of O2 continuous. Sheis followed byCardiologists forA Fib and CHF. She reports her SOB has greatly improved since her fluid has improved.   She had an ECHO on 60-65%. She is currently taking Eliquis 5 mg daily.  She is followed by Neurologists for TIA seizure and has an EEG scheduled later this month. Hypertension This is a chronic problem. The current episode started more than 1 year ago. The problem has been resolved since onset. The problem is controlled. Associated symptoms include shortness of breath. Pertinent negatives include no malaise/fatigue or peripheral edema. Risk factors for coronary artery disease include dyslipidemia and obesity. The current treatment provides moderate improvement.  Hypertensive end-organ damage includes heart failure.  Congestive Heart Failure Presents for follow-up visit. Associated symptoms include edema and shortness of breath. Pertinent negatives include no fatigue. The symptoms have been stable.  Arthritis Presents for follow-up visit. She complains of pain and stiffness. The symptoms have been stable. Affected locations include the left knee, right knee, left MCP and right MCP. Her pain is at a severity of 5/10. Pertinent negatives include no fatigue.  Hyperlipidemia This is a chronic problem. The current episode started more than 1 year ago. The problem is controlled. Recent lipid tests were reviewed and are normal. Associated symptoms include shortness of breath. Current antihyperlipidemic treatment includes statins. The current treatment provides moderate improvement of lipids. Risk factors for coronary artery disease include dyslipidemia, hypertension, post-menopausal and a sedentary lifestyle.      Review of Systems  Constitutional: Negative for fatigue and malaise/fatigue.  Respiratory: Positive for shortness of breath.   Musculoskeletal: Positive for arthritis and stiffness.     Observations/Objective: No SOB or distress noted, talkative.   Assessment and Plan: 1. Primary hypertension  2. Congestive heart failure, unspecified HF chronicity, unspecified heart failure type ([place])  3. Atrial fibrillation, unspecified type ([place])  4. Primary osteoarthritis of both knees - meloxicam (MOBIC) 7.5 MG tablet; Take 1 tablet (7.5 mg total) by mouth daily as needed for pain.  5. On home oxygen therapy  6. Obesity (BMI 30-39.9)  7. Mixed hyperlipidemia  8. Arthritis of knee, left - meloxicam (MOBIC) 7.5 MG tablet; Take 1 tablet (7.5 mg total) by mouth daily as needed for pain.  Keep appt with Cardiologists and Neurologists  Continue medications    I discussed  the assessment and treatment plan with the patient. The patient was provided  an opportunity to ask questions and all were answered. The patient agreed with the plan and demonstrated an understanding of the instructions.   The patient was advised to call back or seek an in-person evaluation if the symptoms worsen or if the condition fails to improve as anticipated.  The above assessment and management plan was discussed with the patient. The patient verbalized understanding of and has agreed to the management plan. Patient is aware to call the clinic if symptoms persist or worsen. Patient is aware when to return to the clinic for a follow-up visit. Patient educated on when it is appropriate to go to the emergency department.   Time call ended:  12:12 pm   I provided 22 minutes of  non face-to-face time during this encounter.    Evelina Dun, FNP

## 2021-05-03 DIAGNOSIS — I509 Heart failure, unspecified: Secondary | ICD-10-CM | POA: Diagnosis not present

## 2021-05-03 DIAGNOSIS — R0602 Shortness of breath: Secondary | ICD-10-CM | POA: Diagnosis not present

## 2021-05-12 DIAGNOSIS — R0602 Shortness of breath: Secondary | ICD-10-CM | POA: Diagnosis not present

## 2021-05-12 DIAGNOSIS — R609 Edema, unspecified: Secondary | ICD-10-CM | POA: Diagnosis not present

## 2021-05-12 DIAGNOSIS — I509 Heart failure, unspecified: Secondary | ICD-10-CM | POA: Diagnosis not present

## 2021-05-13 ENCOUNTER — Telehealth: Payer: Medicare Other

## 2021-05-17 ENCOUNTER — Encounter: Payer: Self-pay | Admitting: Family

## 2021-05-17 ENCOUNTER — Ambulatory Visit (INDEPENDENT_AMBULATORY_CARE_PROVIDER_SITE_OTHER): Payer: Medicare Other | Admitting: Family

## 2021-05-17 DIAGNOSIS — U071 COVID-19: Secondary | ICD-10-CM | POA: Diagnosis not present

## 2021-05-17 MED ORDER — MOLNUPIRAVIR EUA 200MG CAPSULE: 4.0000 | ORAL_CAPSULE | Freq: Two times a day (BID) | ORAL | 0 refills | Status: DC

## 2021-05-17 NOTE — Progress Notes (Signed)
   Virtual Visit  Note Due to COVID-19 pandemic this visit was conducted virtually. This visit type was conducted due to national recommendations for restrictions regarding the COVID-19 Pandemic (e.g. social distancing, sheltering in place) in an effort to limit this patient's exposure and mitigate transmission in our community. All issues noted in this document were discussed and addressed.  A physical exam was not performed with this format.  I connected with Paige Ferguson's daughter on 05/17/21 at 9:26 AM  by telephone and verified that I am speaking with the correct person using two identifiers. Paige Ferguson is currently located at home and daughter is currently with her during visit. The provider, Evelina Dun, FNP is located in their office at time of visit.  I discussed the limitations, risks, security and privacy concerns of performing an evaluation and management service by telephone and the availability of in person appointments. I also discussed with the patient that there may be a patient responsible charge related to this service. The patient expressed understanding and agreed to proceed.   History and Present Illness:  Pt presents today with COVID positive. She reports her cough started 3 days ago and tested positive yesterday.   Cough This is a new problem. The current episode started yesterday. The problem occurs every few minutes. The cough is Non-productive. Associated symptoms include a fever (99), nasal congestion and shortness of breath. Pertinent negatives include no chills, ear congestion, ear pain, headaches or wheezing.     Review of Systems  Constitutional:  Positive for fever (99). Negative for chills.  HENT:  Negative for ear pain.   Respiratory:  Positive for cough and shortness of breath. Negative for wheezing.   Neurological:  Negative for headaches.    Observations/Objective: No SOB or distress noted, intermittent dry cough  Assessment and Plan: 1.  COVID-19 virus detected COVID positive, rest, force fluids, tylenol as needed, Quarantine for at least 5 days and fever free, report any worsening symptoms such as increased shortness of breath, swelling, or continued high fevers.  Possible adverse effects discussed  - molnupiravir EUA 200 mg CAPS; Take 4 capsules (800 mg total) by mouth 2 (two) times daily for 5 days.  Dispense: 40 capsule; Refill: 0    I discussed the assessment and treatment plan with the patient. The patient was provided an opportunity to ask questions and all were answered. The patient agreed with the plan and demonstrated an understanding of the instructions.   The patient was advised to call back or seek an in-person evaluation if the symptoms worsen or if the condition fails to improve as anticipated.  The above assessment and management plan was discussed with the patient. The patient verbalized understanding of and has agreed to the management plan. Patient is aware to call the clinic if symptoms persist or worsen. Patient is aware when to return to the clinic for a follow-up visit. Patient educated on when it is appropriate to go to the emergency department.   Time call ended:  9:37 AM   I provided 11 minutes of  non face-to-face time during this encounter.    Evelina Dun, FNP

## 2021-05-20 ENCOUNTER — Telehealth: Payer: Self-pay | Admitting: Family

## 2021-05-20 MED ORDER — BENZONATATE 200 MG PO CAPS: 200.0000 mg | ORAL_CAPSULE | Freq: Three times a day (TID) | ORAL | 1 refills | Status: AC | PRN

## 2021-05-20 NOTE — Telephone Encounter (Signed)
Tessalon Prescription sent to pharmacy   

## 2021-05-20 NOTE — Telephone Encounter (Signed)
Aware and verbalizes understanding.  

## 2021-05-20 NOTE — Telephone Encounter (Signed)
Pt called stating that she is taking her covid medicine but needs christy to call her in a cough medicine because she has a terrible cough.   Please advise and call patient.

## 2021-05-20 NOTE — Telephone Encounter (Signed)
Daughter aware PCP sent her in some tessalon pearls.  Will have them delivered tomorrow.  Daughter aware if she starts having SOB, trouble breathing or gets worse over the weekend to take her to be seen asap at ER.  Daughter verbalized understanding.

## 2021-05-22 ENCOUNTER — Encounter (HOSPITAL_COMMUNITY): Payer: Self-pay

## 2021-05-22 ENCOUNTER — Observation Stay (HOSPITAL_COMMUNITY)
Admission: EM | Admit: 2021-05-22 | Discharge: 2021-05-23 | Disposition: A | Discharge status: Home / Self Care | Payer: Medicare Other | Attending: Family Medicine | Admitting: Family Medicine

## 2021-05-22 ENCOUNTER — Emergency Department (HOSPITAL_COMMUNITY): Payer: Medicare Other

## 2021-05-22 ENCOUNTER — Other Ambulatory Visit: Payer: Self-pay

## 2021-05-22 DIAGNOSIS — R7309 Other abnormal glucose: Secondary | ICD-10-CM | POA: Diagnosis present

## 2021-05-22 DIAGNOSIS — Z79899 Other long term (current) drug therapy: Secondary | ICD-10-CM | POA: Insufficient documentation

## 2021-05-22 DIAGNOSIS — R6889 Other general symptoms and signs: Secondary | ICD-10-CM | POA: Diagnosis not present

## 2021-05-22 DIAGNOSIS — Z87891 Personal history of nicotine dependence: Secondary | ICD-10-CM | POA: Insufficient documentation

## 2021-05-22 DIAGNOSIS — I130 Hypertensive heart and chronic kidney disease with heart failure and stage 1 through stage 4 chronic kidney disease, or unspecified chronic kidney disease: Secondary | ICD-10-CM | POA: Diagnosis not present

## 2021-05-22 DIAGNOSIS — Z9981 Dependence on supplemental oxygen: Secondary | ICD-10-CM

## 2021-05-22 DIAGNOSIS — J1282 Pneumonia due to coronavirus disease 2019: Secondary | ICD-10-CM | POA: Diagnosis present

## 2021-05-22 DIAGNOSIS — N189 Chronic kidney disease, unspecified: Secondary | ICD-10-CM | POA: Insufficient documentation

## 2021-05-22 DIAGNOSIS — Z85828 Personal history of other malignant neoplasm of skin: Secondary | ICD-10-CM | POA: Diagnosis not present

## 2021-05-22 DIAGNOSIS — U071 COVID-19: Principal | ICD-10-CM | POA: Insufficient documentation

## 2021-05-22 DIAGNOSIS — I509 Heart failure, unspecified: Secondary | ICD-10-CM

## 2021-05-22 DIAGNOSIS — I10 Essential (primary) hypertension: Secondary | ICD-10-CM | POA: Diagnosis present

## 2021-05-22 DIAGNOSIS — I4891 Unspecified atrial fibrillation: Secondary | ICD-10-CM | POA: Diagnosis present

## 2021-05-22 DIAGNOSIS — Z7901 Long term (current) use of anticoagulants: Secondary | ICD-10-CM | POA: Insufficient documentation

## 2021-05-22 DIAGNOSIS — Z905 Acquired absence of kidney: Secondary | ICD-10-CM

## 2021-05-22 DIAGNOSIS — R0602 Shortness of breath: Secondary | ICD-10-CM | POA: Diagnosis not present

## 2021-05-22 DIAGNOSIS — J90 Pleural effusion, not elsewhere classified: Secondary | ICD-10-CM | POA: Diagnosis not present

## 2021-05-22 DIAGNOSIS — E785 Hyperlipidemia, unspecified: Secondary | ICD-10-CM | POA: Diagnosis present

## 2021-05-22 DIAGNOSIS — L8990 Pressure ulcer of unspecified site, unspecified stage: Secondary | ICD-10-CM | POA: Diagnosis present

## 2021-05-22 DIAGNOSIS — I499 Cardiac arrhythmia, unspecified: Secondary | ICD-10-CM | POA: Diagnosis not present

## 2021-05-22 DIAGNOSIS — R059 Cough, unspecified: Secondary | ICD-10-CM | POA: Diagnosis not present

## 2021-05-22 DIAGNOSIS — Z743 Need for continuous supervision: Secondary | ICD-10-CM | POA: Diagnosis not present

## 2021-05-22 LAB — CBC WITH DIFFERENTIAL/PLATELET
Abs Immature Granulocytes: 0.01 10*3/uL (ref 0.00–0.07)
Basophils Absolute: 0 10*3/uL (ref 0.0–0.1)
Basophils Relative: 0 %
Eosinophils Absolute: 0 10*3/uL (ref 0.0–0.5)
Eosinophils Relative: 0 %
HCT: 32.6 % — ABNORMAL LOW (ref 36.0–46.0)
Hemoglobin: 10.7 g/dL — ABNORMAL LOW (ref 12.0–15.0)
Immature Granulocytes: 0 %
Lymphocytes Relative: 26 %
Lymphs Abs: 2.1 10*3/uL (ref 0.7–4.0)
MCH: 32.4 pg (ref 26.0–34.0)
MCHC: 32.8 g/dL (ref 30.0–36.0)
MCV: 98.8 fL (ref 80.0–100.0)
Monocytes Absolute: 1 10*3/uL (ref 0.1–1.0)
Monocytes Relative: 13 %
Neutro Abs: 4.8 10*3/uL (ref 1.7–7.7)
Neutrophils Relative %: 61 %
Platelets: 187 10*3/uL (ref 150–400)
RBC: 3.3 MIL/uL — ABNORMAL LOW (ref 3.87–5.11)
RDW: 13.9 % (ref 11.5–15.5)
WBC: 7.9 10*3/uL (ref 4.0–10.5)
nRBC: 0 % (ref 0.0–0.2)

## 2021-05-22 LAB — C-REACTIVE PROTEIN: CRP: 1.7 mg/dL — ABNORMAL HIGH (ref <1.0)

## 2021-05-22 LAB — LACTIC ACID, PLASMA: Lactic Acid, Venous: 0.9 mmol/L (ref 0.5–1.9)

## 2021-05-22 LAB — RESP PANEL BY RT-PCR (FLU A&B, COVID) ARPGX2
Influenza A by PCR: NEGATIVE
Influenza B by PCR: NEGATIVE
SARS Coronavirus 2 by RT PCR: POSITIVE — AB

## 2021-05-22 LAB — COMPREHENSIVE METABOLIC PANEL
ALT: 18 U/L (ref 0–44)
AST: 40 U/L (ref 15–41)
Albumin: 3.2 g/dL — ABNORMAL LOW (ref 3.5–5.0)
Alkaline Phosphatase: 50 U/L (ref 38–126)
Anion gap: 8 (ref 5–15)
BUN: 27 mg/dL — ABNORMAL HIGH (ref 8–23)
CO2: 31 mmol/L (ref 22–32)
Calcium: 8.1 mg/dL — ABNORMAL LOW (ref 8.9–10.3)
Chloride: 94 mmol/L — ABNORMAL LOW (ref 98–111)
Creatinine, Ser: 0.82 mg/dL (ref 0.44–1.00)
GFR, Estimated: >60 mL/min (ref >60)
Glucose, Bld: 130 mg/dL — ABNORMAL HIGH (ref 70–99)
Potassium: 3.2 mmol/L — ABNORMAL LOW (ref 3.5–5.1)
Sodium: 133 mmol/L — ABNORMAL LOW (ref 135–145)
Total Bilirubin: 0.9 mg/dL (ref 0.3–1.2)
Total Protein: 6.3 g/dL — ABNORMAL LOW (ref 6.5–8.1)

## 2021-05-22 LAB — FIBRINOGEN: Fibrinogen: 309 mg/dL (ref 210–475)

## 2021-05-22 LAB — LACTATE DEHYDROGENASE: LDH: 171 U/L (ref 98–192)

## 2021-05-22 LAB — TRIGLYCERIDES: Triglycerides: 57 mg/dL (ref <150)

## 2021-05-22 LAB — FERRITIN: Ferritin: 261 ng/mL (ref 11–307)

## 2021-05-22 LAB — D-DIMER, QUANTITATIVE: D-Dimer, Quant: 0.66 ug/mL-FEU — ABNORMAL HIGH (ref 0.00–0.50)

## 2021-05-22 LAB — PROCALCITONIN: Procalcitonin: 0.12 ng/mL

## 2021-05-22 IMAGING — DX DG CHEST 1V PORT
1 series · 1 of 1 positions shown · non-contrast
Comparison: Chest radiograph and lung bases from abdominal CT
[DATE]

CLINICAL DATA: Shortness of breath.  Cough.

EXAM:
PORTABLE CHEST 1 VIEW

[chest ap]
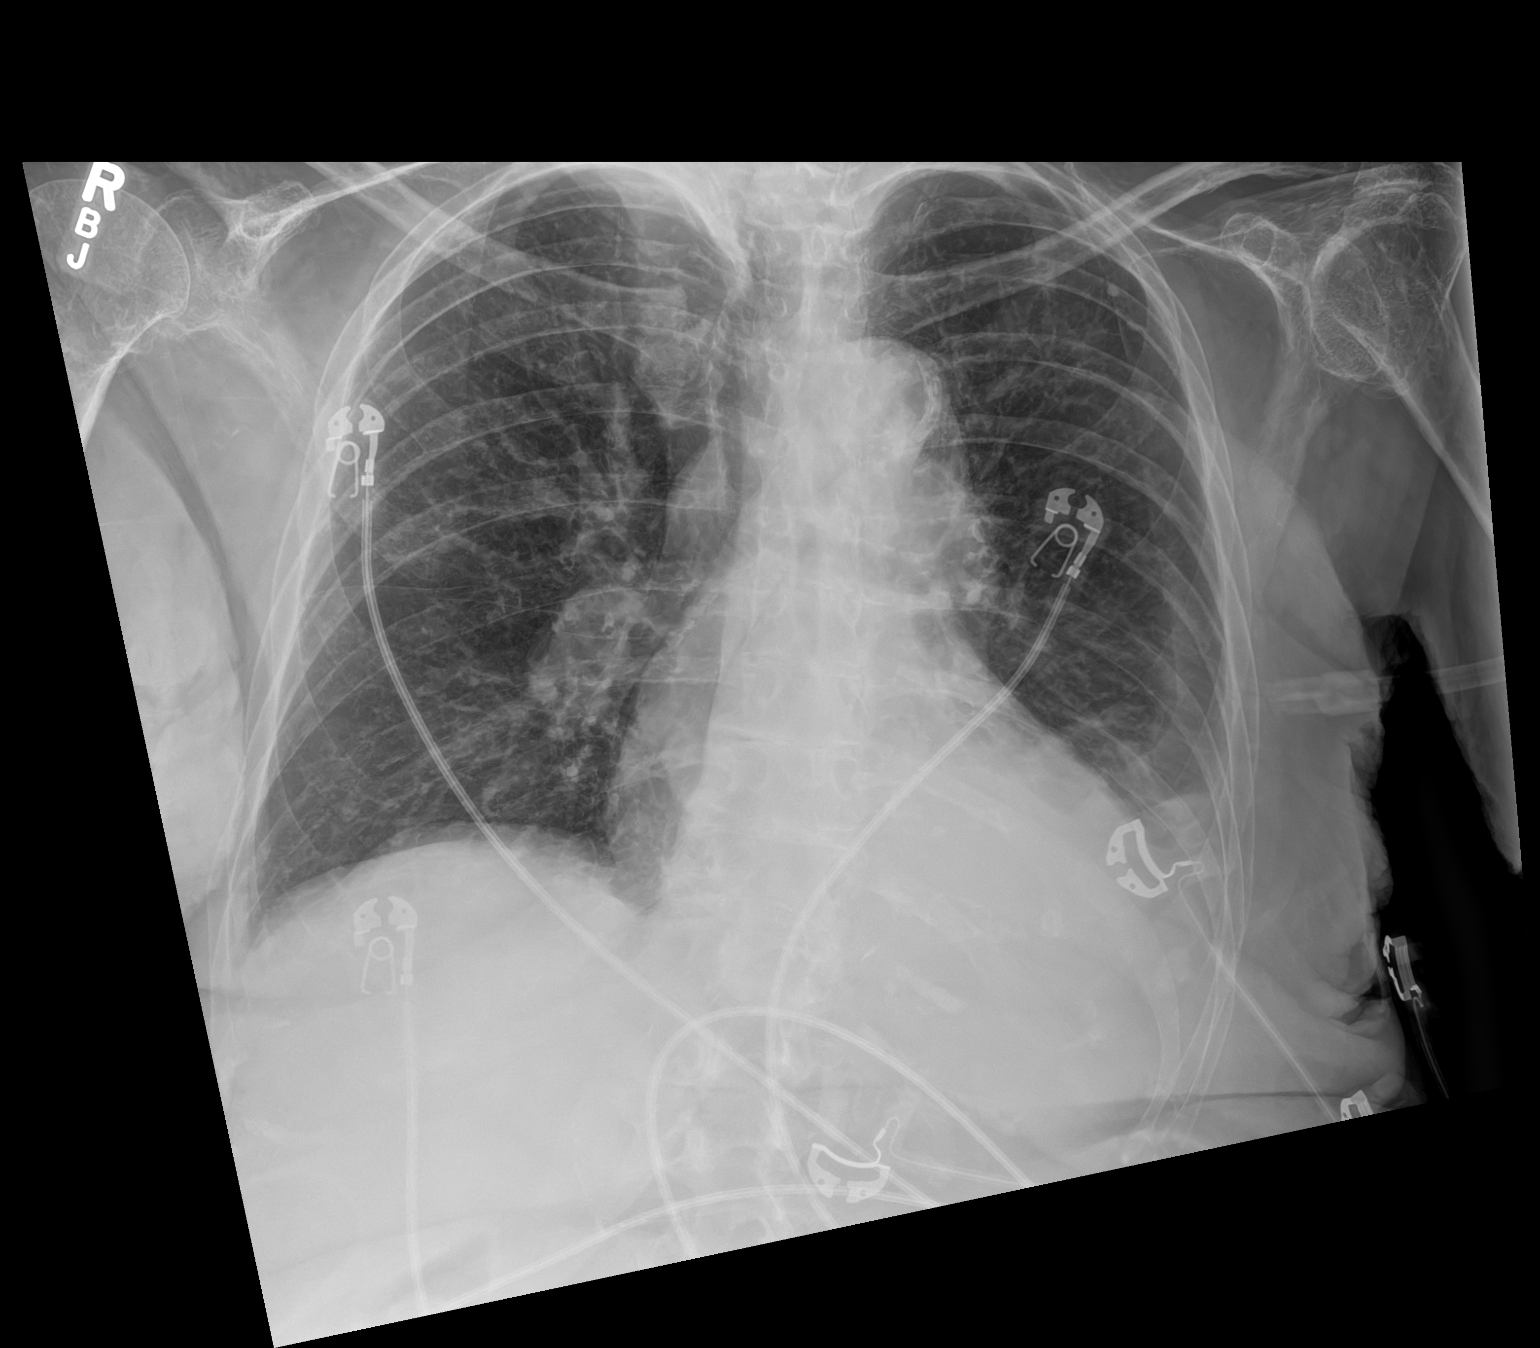

[1 of 1 positions shown; findings below may reference images not displayed]

FINDINGS: Left pleural effusion was present on prior exam but increased in
size. There may be a small right pleural effusion that was not seen
on prior. Stable upper normal heart size. Unchanged mediastinal
contours with aortic atherosclerosis and mild bilateral hilar
prominence. Calcified granuloma in the left lung. No pulmonary
edema. No confluent airspace disease. No pneumothorax. Bones are
under mineralized. Chronic deformity of the left proximal humerus.
IMPRESSION: Left pleural effusion has increased in size since [DATE].
Possible small right pleural effusion, new.

## 2021-05-22 MED ORDER — MELOXICAM 7.5 MG PO TABS: 7.5000 mg | ORAL_TABLET | ORAL | Status: DC

## 2021-05-22 MED ORDER — FUROSEMIDE 40 MG PO TABS
40.0000 mg | ORAL_TABLET | Freq: Two times a day (BID) | ORAL | Status: DC
  Administered 2021-05-23: 40 mg via ORAL
  Filled 2021-05-22: qty 1

## 2021-05-22 MED ORDER — CARVEDILOL 3.125 MG PO TABS
6.2500 mg | ORAL_TABLET | Freq: Two times a day (BID) | ORAL | Status: DC
  Administered 2021-05-22 – 2021-05-23 (×2): 6.25 mg via ORAL
  Filled 2021-05-22 (×2): qty 2

## 2021-05-22 MED ORDER — ZINC SULFATE 220 (50 ZN) MG PO CAPS
220.0000 mg | ORAL_CAPSULE | Freq: Every day | ORAL | Status: DC
  Administered 2021-05-22 – 2021-05-23 (×2): 220 mg via ORAL
  Filled 2021-05-22 (×2): qty 1

## 2021-05-22 MED ORDER — ONDANSETRON HCL 4 MG PO TABS: 4.0000 mg | ORAL_TABLET | Freq: Four times a day (QID) | ORAL | Status: DC | PRN

## 2021-05-22 MED ORDER — ROSUVASTATIN CALCIUM 5 MG PO TABS
2.5000 mg | ORAL_TABLET | ORAL | Status: DC
  Filled 2021-05-22: qty 0.5

## 2021-05-22 MED ORDER — RALOXIFENE HCL 60 MG PO TABS
60.0000 mg | ORAL_TABLET | Freq: Every day | ORAL | Status: DC
  Administered 2021-05-23: 60 mg via ORAL
  Filled 2021-05-22: qty 1

## 2021-05-22 MED ORDER — APIXABAN 2.5 MG PO TABS
2.5000 mg | ORAL_TABLET | Freq: Two times a day (BID) | ORAL | Status: DC
  Administered 2021-05-22 – 2021-05-23 (×2): 2.5 mg via ORAL
  Filled 2021-05-22 (×2): qty 1

## 2021-05-22 MED ORDER — AMLODIPINE BESYLATE 5 MG PO TABS
5.0000 mg | ORAL_TABLET | Freq: Every day | ORAL | Status: DC
  Administered 2021-05-23: 5 mg via ORAL
  Filled 2021-05-22: qty 1

## 2021-05-22 MED ORDER — LEVETIRACETAM 250 MG PO TABS
250.0000 mg | ORAL_TABLET | Freq: Two times a day (BID) | ORAL | Status: DC
  Administered 2021-05-22 – 2021-05-23 (×2): 250 mg via ORAL
  Filled 2021-05-22 (×2): qty 1

## 2021-05-22 MED ORDER — DEXAMETHASONE SODIUM PHOSPHATE 10 MG/ML IJ SOLN
6.0000 mg | Freq: Once | INTRAMUSCULAR | Status: AC
  Administered 2021-05-22: 6 mg via INTRAVENOUS
  Filled 2021-05-22: qty 1

## 2021-05-22 MED ORDER — INSULIN ASPART 100 UNIT/ML IJ SOLN: 0.0000 [IU] | Freq: Every day | INTRAMUSCULAR | Status: DC

## 2021-05-22 MED ORDER — PANTOPRAZOLE SODIUM 20 MG PO TBEC
20.0000 mg | DELAYED_RELEASE_TABLET | Freq: Every day | ORAL | Status: DC
  Filled 2021-05-22: qty 1

## 2021-05-22 MED ORDER — BENAZEPRIL HCL 10 MG PO TABS
40.0000 mg | ORAL_TABLET | Freq: Every day | ORAL | Status: DC
  Administered 2021-05-23: 40 mg via ORAL
  Filled 2021-05-22: qty 4

## 2021-05-22 MED ORDER — INSULIN ASPART 100 UNIT/ML IJ SOLN: 0.0000 [IU] | Freq: Three times a day (TID) | INTRAMUSCULAR | Status: DC

## 2021-05-22 MED ORDER — ASCORBIC ACID 500 MG PO TABS
500.0000 mg | ORAL_TABLET | Freq: Every day | ORAL | Status: DC
  Administered 2021-05-22 – 2021-05-23 (×2): 500 mg via ORAL
  Filled 2021-05-22 (×2): qty 1

## 2021-05-22 MED ORDER — BENZONATATE 100 MG PO CAPS
100.0000 mg | ORAL_CAPSULE | Freq: Three times a day (TID) | ORAL | Status: DC | PRN
  Administered 2021-05-22: 100 mg via ORAL
  Filled 2021-05-22: qty 1

## 2021-05-22 MED ORDER — ONDANSETRON HCL 4 MG/2ML IJ SOLN: 4.0000 mg | Freq: Four times a day (QID) | INTRAMUSCULAR | Status: DC | PRN

## 2021-05-22 MED ORDER — DEXAMETHASONE 4 MG PO TABS
6.0000 mg | ORAL_TABLET | Freq: Every day | ORAL | Status: DC
  Administered 2021-05-23: 6 mg via ORAL
  Filled 2021-05-22: qty 2

## 2021-05-22 NOTE — Progress Notes (Signed)
Patient admitted to the floor.  Patient is alert and oriented x 4 and on chronic oxygen placed at 3 liters.  Patient has multiple moles all over body.  Patient admitted to floor with MASD under breasts and a stage 1 to buttocks.

## 2021-05-22 NOTE — ED Provider Notes (Signed)
[facility] Provider Note   CSN: [id number] Arrival date & time: 05/22/21  1457     History Chief Complaint  Patient presents with   Cough    Paige Ferguson is [place] 87 y.o. female.  She is on 3 L of oxygen 24/7.  She recently was COVID-positive on 25 June.  She has been at home with her family who are also sick.  She is vaccinated.  Complaining of cough sometimes productive of clear sputum, increased shortness of breath, poor p.o. intake, some loose stool, generalized weakness and fatigue.  The history is provided by the patient.  Cough Cough characteristics:  Non-productive Sputum characteristics:  Clear Severity:  Moderate Onset quality:  Gradual Duration:  10 days Timing:  Intermittent Progression:  Worsening Context: sick contacts   Relieved by:  Nothing Worsened by:  Activity and deep breathing Ineffective treatments:  Cough suppressants Associated symptoms: fever, myalgias and shortness of breath   Associated symptoms: no chest pain, no headaches, no rash and no sore throat       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   Right Upper Arm Anterior   Shoulder fracture, left    Skin cancer 01/25/2021   Right upper arm- anterior -sebaceous carinoma ([facility])   TIA (transient ischemic attack)    Ureteral calculi    URI (upper respiratory infection)     Patient Active Problem List   Diagnosis Date Noted   Ringworm 11/26/2020   On home  oxygen therapy 09/19/2019   Atrial fibrillation ([place]) 06/26/2019   CHF (congestive heart failure) ([place]) 06/26/2019   Pressure injury of toe of left foot, stage 1 04/02/2018   Obesity (BMI 30-39.9) 78/29/5621   Metabolic syndrome 30/86/5784   Fissure in ano 03/09/2014   Hemorrhoid 03/09/2014   TIA (transient ischemic attack) 12/23/2013   Hyperlipidemia    Renal abscess, right    Osteoarthritis    Postmenopausal bleeding    Ureteral calculi    Recurrent UTI    Single kidney 06/05/2013   HTN (hypertension) 02/28/2013   Gout 02/28/2013   Osteoporosis 02/28/2013   Arthritis of knee, left 02/28/2013   Genu valgum (acquired) 02/28/2013   Endometrial polyp 05/20/1997    Past Surgical History:  Procedure Laterality Date   EYE SURGERY     HERNIA REPAIR     HYSTEROSCOPY , FRACTIONAL D&C  7/98/   [facility]   LEFT CATARACT SURGERY  10/99   RIGHT CATARACT SURGERY  7/99   Dr. Dolores Lory    RIGHT HEMINEPHRECTOMY FOR ABSCESS  2/98   RIGHT [place]     OB History   No obstetric history on file.     Family History  Problem Relation Age of Onset   Stroke Maternal Grandmother    Hypertension  Maternal Grandmother    Diabetes Maternal Grandmother    Pneumonia Father    Coronary artery disease Paternal Grandfather    Heart defect Son     Social History   Tobacco Use   Smoking status: Former    Pack years: 0.00    Types: Cigarettes    Quit date: 11/20/1944    Years since quitting: 76.5   Smokeless tobacco: Never  Vaping Use   Vaping Use: Never used  Substance Use Topics   Alcohol use: No   Drug use: No    Home Medications Prior to Admission medications   Medication Sig Start Date End Date Taking? Authorizing Provider  amLODipine (NORVASC) 5 MG tablet Take 1 tablet (5 mg total) by mouth daily. 10/19/20   Sharion Balloon, FNP  apixaban (ELIQUIS) 2.5 MG TABS tablet Take 1 tablet (2.5 mg total) by mouth 2 (two) times daily. 02/04/21   Johnson,  Clanford L, MD  benazepril (LOTENSIN) 40 MG tablet Take 1 tablet (40 mg total) by mouth daily. 10/19/20   Paige Dun A, FNP  benzonatate (TESSALON) 200 MG capsule Take 1 capsule (200 mg total) by mouth 3 (three) times daily as needed. 05/20/21   Sharion Balloon, FNP  Calcium-Magnesium-Vitamin D (CALCIUM 1200+D3 PO) Take 1 tablet by mouth daily.    [provider]  carvedilol (COREG) 6.25 MG tablet Take 1 tablet (6.25 mg total) by mouth 2 (two) times daily with Ferguson meal. 10/19/20   Hawks, Christy A, FNP  diphenhydrAMINE-zinc acetate (BENADRYL ITCH STOPPING) cream Apply topically 3 (three) times daily as needed for itching. 11/26/20   Paige Lynn, NP  furosemide (LASIX) 40 MG tablet Take 1 tablet (40 mg total) by mouth 2 (two) times daily. 02/04/21   Johnson, Clanford L, MD  levETIRAcetam (KEPPRA) 250 MG tablet Take 1 tablet (250 mg total) by mouth 2 (two) times daily. 02/05/21   Johnson, Clanford L, MD  meloxicam (MOBIC) 7.5 MG tablet Take 1 tablet (7.5 mg total) by mouth daily as needed for pain. 04/20/21   Sharion Balloon, FNP  Misc Natural Products (GNP GLUCOSAME CHONDROITIN DS) TABS Take 1 tablet by mouth daily.    [provider]  molnupiravir EUA 200 mg CAPS Take 4 capsules (800 mg total) by mouth 2 (two) times daily for 5 days. 05/17/21 05/22/21  Sharion Balloon, FNP  Multiple Vitamins-Minerals (CENTRUM SILVER ADULT 50+) TABS Take 1 tablet by mouth daily.    [provider]  Omega-3 Fatty Acids (FISH OIL) 1000 MG CAPS Take 1 capsule by mouth 2 (two) times daily.    [provider]  pantoprazole (PROTONIX) 20 MG tablet Take 1 tablet (20 mg total) by mouth daily. 02/10/21   Sharion Balloon, FNP  raloxifene (EVISTA) 60 MG tablet Take 1 tablet (60 mg total) by mouth daily. 10/19/20   Sharion Balloon, FNP  rosuvastatin (CRESTOR) 5 MG tablet Take 0.5 tablets (2.5 mg total) by mouth 3 (three) times Ferguson week. 04/20/21 05/20/21  Paige Dun A, FNP  terbinafine (LAMISIL  AT) 1 % cream Apply 1 application topically 2 (two) times daily. 11/26/20   Paige Lynn, NP  vitamin E 200 UNIT capsule Take 200 Units by mouth daily.    [provider]    Allergies    Sulfa antibiotics, Amoxicillin, and Nitrofurantoin monohyd macro  Review of Systems   Review of Systems  Constitutional:  Positive for appetite change, fatigue and fever.  HENT:  Negative for  sore throat.   Eyes:  Negative for pain.  Respiratory:  Positive for cough and shortness of breath.   Cardiovascular:  Negative for chest pain.  Gastrointestinal:  Positive for diarrhea. Negative for nausea and vomiting.  Genitourinary:  Negative for dysuria.  Musculoskeletal:  Positive for myalgias.  Skin:  Negative for rash.  Neurological:  Negative for headaches.   Physical Exam Updated Vital Signs BP (!) 141/73 (BP Location: Left Arm)   Pulse (!) 101   Temp 98.3 F (36.8 C) (Oral)   Resp 18   Ht 5\' 2"  (1.575 m)   Wt 57.8 kg   SpO2 100%   BMI 23.32 kg/m   Physical Exam Vitals and nursing note reviewed.  Constitutional:      General: She is not in acute distress.    Appearance: Normal appearance. She is well-developed.  HENT:     Head: Normocephalic and atraumatic.  Eyes:     Conjunctiva/sclera: Conjunctivae normal.  Cardiovascular:     Rate and Rhythm: Tachycardia present. Rhythm irregular.     Heart sounds: No murmur heard. Pulmonary:     Effort: Pulmonary effort is normal. No respiratory distress.     Breath sounds: Normal breath sounds.  Abdominal:     Palpations: Abdomen is soft.     Tenderness: There is no abdominal tenderness. There is no guarding or rebound.  Musculoskeletal:        General: No signs of injury. Normal range of motion.     Cervical back: Neck supple.     Right lower leg: No edema.     Left lower leg: No edema.  Skin:    General: Skin is warm and dry.  Neurological:     General: No focal deficit present.     Mental Status: She is alert. Mental status  is at baseline.    ED Results / Procedures / Treatments   Labs (all labs ordered are listed, but only abnormal results are displayed) Labs Reviewed  RESP PANEL BY RT-PCR (FLU Ferguson&B, COVID) ARPGX2 - Abnormal; Notable for the following components:      Result Value   SARS Coronavirus 2 by RT PCR POSITIVE (*)    All other components within normal limits  CBC WITH DIFFERENTIAL/PLATELET - Abnormal; Notable for the following components:   RBC 3.30 (*)    Hemoglobin 10.7 (*)    HCT 32.6 (*)    All other components within normal limits  COMPREHENSIVE METABOLIC PANEL - Abnormal; Notable for the following components:   Sodium 133 (*)    Potassium 3.2 (*)    Chloride 94 (*)    Glucose, Bld 130 (*)    BUN 27 (*)    Calcium 8.1 (*)    Total Protein 6.3 (*)    Albumin 3.2 (*)    All other components within normal limits  D-DIMER, QUANTITATIVE - Abnormal; Notable for the following components:   D-Dimer, Quant 0.66 (*)    All other components within normal limits  C-REACTIVE PROTEIN - Abnormal; Notable for the following components:   CRP 1.7 (*)    All other components within normal limits  C-REACTIVE PROTEIN - Abnormal; Notable for the following components:   CRP 1.6 (*)    All other components within normal limits  CBC WITH DIFFERENTIAL/PLATELET - Abnormal; Notable for the following components:   RBC 3.51 (*)    Hemoglobin 11.2 (*)    HCT 34.6 (*)    All other components within normal limits  COMPREHENSIVE METABOLIC PANEL - Abnormal; Notable for the following components:   Chloride 93 (*)    Glucose, Bld 134 (*)    Calcium 8.1 (*)    Total Protein 6.4 (*)    Albumin 3.1 (*)    All other components within normal limits  D-DIMER, QUANTITATIVE - Abnormal; Notable for the following components:   D-Dimer, Quant 0.65 (*)    All other components within normal limits  CULTURE, BLOOD (ROUTINE X 2)  CULTURE, BLOOD (ROUTINE X 2)  LACTIC ACID, PLASMA  PROCALCITONIN  LACTATE DEHYDROGENASE   FERRITIN  TRIGLYCERIDES  FIBRINOGEN  HEMOGLOBIN A1C    EKG EKG Interpretation  Date/Time:  Sunday May 22 2021 16:02:46 EDT Ventricular Rate:  88 PR Interval:    QRS Duration: 88 QT Interval:  363 QTC Calculation: 440 R Axis:   107 Text Interpretation: Atrial fibrillation Right axis deviation Low voltage, extremity leads No significant change since prior 3/22 Confirmed by Aletta Edouard [phone]) on 05/22/2021 4:30:24 PM  Radiology DG Chest Port 1 View  Result Date: 05/22/2021 CLINICAL DATA:  Shortness of breath.  Cough. EXAM: PORTABLE CHEST 1 VIEW COMPARISON:  Chest radiograph and lung bases from abdominal CT 12/03/2020 FINDINGS: Left pleural effusion was present on prior exam but increased in size. There may be Ferguson small right pleural effusion that was not seen on prior. Stable upper normal heart size. Unchanged mediastinal contours with aortic atherosclerosis and mild bilateral hilar prominence. Calcified granuloma in the left lung. No pulmonary edema. No confluent airspace disease. No pneumothorax. Bones are under mineralized. Chronic deformity of the left proximal humerus. IMPRESSION: Left pleural effusion has increased in size since January 2022. Possible small right pleural effusion, new. Electronically Signed   By: Keith Rake M.D.   On: 05/22/2021 16:18    Procedures Procedures   Medications Ordered in ED Medications  benzonatate (TESSALON) capsule 100 mg (100 mg Oral Given 05/22/21 1811)  amLODipine (NORVASC) tablet 5 mg (has no administration in time range)  benazepril (LOTENSIN) tablet 40 mg (has no administration in time range)  carvedilol (COREG) tablet 6.25 mg (6.25 mg Oral Given 05/22/21 2125)  furosemide (LASIX) tablet 40 mg (has no administration in time range)  rosuvastatin (CRESTOR) tablet 2.5 mg (has no administration in time range)  raloxifene (EVISTA) tablet 60 mg (has no administration in time range)  apixaban (ELIQUIS) tablet 2.5 mg (2.5 mg Oral Given 05/22/21  2125)  levETIRAcetam (KEPPRA) tablet 250 mg (250 mg Oral Given 05/22/21 2125)  dexamethasone (DECADRON) tablet 6 mg (has no administration in time range)  ascorbic acid (VITAMIN C) tablet 500 mg (500 mg Oral Given 05/22/21 2125)  zinc sulfate capsule 220 mg (220 mg Oral Given 05/22/21 2125)  insulin aspart (novoLOG) injection 0-15 Units (has no administration in time range)  insulin aspart (novoLOG) injection 0-5 Units (0 Units Subcutaneous Not Given 05/22/21 2149)  ondansetron (ZOFRAN) tablet 4 mg (has no administration in time range)    Or  ondansetron (ZOFRAN) injection 4 mg (has no administration in time range)  pantoprazole (PROTONIX) EC tablet 40 mg (has no administration in time range)  dexamethasone (DECADRON) injection 6 mg (6 mg Intravenous Given 05/22/21 2003)    ED Course  I have reviewed the triage vital signs and the nursing notes.  Pertinent labs & imaging results that were available during my care of the patient were reviewed by me and considered in my medical decision making (see chart for details).    MDM Rules/Calculators/Ferguson&P  RONI FRIBERG was evaluated in Emergency Department on 05/22/2021 for the symptoms described in the history of present illness. She was evaluated in the context of the global COVID-19 pandemic, which necessitated consideration that the patient might be at risk for infection with the SARS-CoV-2 virus that causes COVID-19. Institutional protocols and algorithms that pertain to the evaluation of patients at risk for COVID-19 are in [place] state of rapid change based on information released by regulatory bodies including the CDC and federal and state organizations. These policies and algorithms were followed during the patient's care in the ED.  This patient complains of cough and generalized weakness in the setting of recent COVID diagnosis; this involves an extensive number of treatment Options and is Ferguson complaint that carries with it Ferguson high risk  of complications and Morbidity. The differential includes COVID, COVID-pneumonia, hypoxia metabolic derangement, dehydration, failure to thrive  I ordered, reviewed and interpreted labs, which included CBC with normal white count, hemoglobin low stable from priors, chemistries fairly unremarkable, inflammatory markers mildly elevated, COVID positive I ordered medication IV steroids, and Tessalon for cough I ordered imaging studies which included chest x-ray and I independently    visualized and interpreted imaging which showed small effusions Previous records obtained and reviewed in epic, no recent admissions I consulted [facility] hospitalist Dr. Nehemiah Settle and discussed lab and imaging findings  Critical Interventions: None  After the interventions stated above, I reevaluated the patient and found patient to be fairly comfortable.  Due to her age and frailty feel she would be better served by being in the hospital for some supportive treatment.   Final Clinical Impression(s) / ED Diagnoses Final diagnoses:  COVID-19 virus infection    Rx / DC Orders ED Discharge Orders     None        Hayden Rasmussen, MD 05/23/21 [phone]

## 2021-05-22 NOTE — H&P (Signed)
History and Physical  Paige Ferguson:[id number] DOB: Jun 16, 1921 DOA: 05/22/2021  Referring physician: Dr Melina Copa, ED physician PCP: Sharion Balloon, FNP  Outpatient Specialists:   Patient Coming From: Home  Chief Complaint:  SOB, COVID  HPI: Paige Ferguson is a 87 y.o. female with a history of hypertension, CHF, hyperlipidemia, gout, osteoarthritis, TIA, chronic kidney disease.  Patient tested positive on 6/27.  Had a cough that started 3 days prior to that.  She has other family members who tested positive for COVID as well.  She called her primary doctor who started her on Paxlovid.  She has completed the course and still feels short of breath, especially with activity.  She does wear oxygen all the time and denies increased oxygen demand.  She does have a cough that is nonproductive.  Emergency Department Course: White count normal.  CRP elevated at 1.7.  Procalcitonin 1.12.  Creatinine normal.  Review of Systems:    Pt denies any fevers, chills, nausea, vomiting, diarrhea, constipation, abdominal pain,  palpitations, headache, vision changes, lightheadedness, dizziness, melena, rectal bleeding.  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 Dolores Lory    RIGHT HEMINEPHRECTOMY FOR ABSCESS  2/98   RIGHT [place]   Social History:  reports that she quit smoking about 76 years ago. Her smoking use included cigarettes. She has never used smokeless tobacco. She reports that she does not drink alcohol and does not use drugs. Patient lives at home  Allergies  Allergen Reactions   Sulfa Antibiotics Nausea And Vomiting   Amoxicillin Other (See Comments)    Unknown reaction   Nitrofurantoin Monohyd Macro Other (See Comments)    Unknown reaction    Family History  Problem Relation Age of Onset   Stroke Maternal Grandmother    Hypertension Maternal Grandmother    Diabetes Maternal Grandmother    Pneumonia Father    Coronary artery disease Paternal Grandfather    Heart defect Son       Prior to Admission medications   Medication Sig Start Date End Date Taking? Authorizing Provider  amLODipine (NORVASC) 5 MG tablet Take 1 tablet (5 mg total) by mouth daily. 10/19/20  Yes Hawks, [place] A,  FNP  apixaban (ELIQUIS) 2.5 MG TABS tablet Take 1 tablet (2.5 mg total) by mouth 2 (two) times daily. 02/04/21  Yes Johnson, Clanford L, MD  benazepril (LOTENSIN) 40 MG tablet Take 1 tablet (40 mg total) by mouth daily. 10/19/20  Yes Hawks, Christy A, FNP  benzonatate (TESSALON) 200 MG capsule Take 1 capsule (200 mg total) by mouth 3 (three) times daily as needed. Patient taking differently: Take 200 mg by mouth 3 (three) times daily as needed for cough. 05/20/21  Yes Hawks, Christy A, FNP  Calcium-Magnesium-Vitamin D (CALCIUM 1200+D3 PO) Take 1 tablet by mouth daily.   Yes [provider]  carvedilol (COREG) 6.25 MG tablet Take 1 tablet (6.25 mg total) by mouth 2  (two) times daily with a meal. 10/19/20  Yes Hawks, Christy A, FNP  diphenhydrAMINE-zinc acetate (BENADRYL ITCH STOPPING) cream Apply topically 3 (three) times daily as needed for itching. 11/26/20  Yes Ivy Lynn, NP  furosemide (LASIX) 40 MG tablet Take 1 tablet (40 mg total) by mouth 2 (two) times daily. 02/04/21  Yes Johnson, Clanford L, MD  levETIRAcetam (KEPPRA) 250 MG tablet Take 1 tablet (250 mg total) by mouth 2 (two) times daily. 02/05/21  Yes Johnson, Clanford L, MD  meloxicam (MOBIC) 7.5 MG tablet Take 1 tablet (7.5 mg total) by mouth daily as needed for pain. Patient taking differently: Take 7.5 mg by mouth daily. Jory Sims, Fri 04/20/21  Yes Hawks, Theador Hawthorne, FNP  Misc Natural Products (GNP GLUCOSAME CHONDROITIN DS) TABS Take 1 tablet by mouth daily.   Yes [provider]  molnupiravir EUA 200 mg CAPS Take 4 capsules (800 mg total) by mouth 2 (two) times daily for 5 days. 05/17/21 05/22/21 Yes Hawks, Christy A, FNP  Multiple Vitamins-Minerals (CENTRUM SILVER ADULT 50+) TABS Take 1 tablet by mouth daily.   Yes [provider]  Omega-3 Fatty Acids (FISH OIL) 1000 MG CAPS Take 1 capsule by mouth 2 (two) times daily.   Yes [provider]  pantoprazole (PROTONIX) 20 MG tablet Take 1 tablet (20 mg total) by mouth daily. 02/10/21  Yes Hawks, Christy A, FNP  raloxifene (EVISTA) 60 MG tablet Take 1 tablet (60 mg total) by mouth daily. 10/19/20  Yes Hawks, Christy A, FNP  rosuvastatin (CRESTOR) 5 MG tablet Take 0.5 tablets (2.5 mg total) by mouth 3 (three) times a week. Patient taking differently: Take 2.5 mg by mouth 3 (three) times a week. Jory Sims, Fri 04/20/21 05/22/21 Yes Hawks, Christy A, FNP  terbinafine (LAMISIL AT) 1 % cream Apply 1 application topically 2 (two) times daily. 11/26/20  Yes Ivy Lynn, NP  vitamin E 200 UNIT capsule Take 200 Units by mouth daily.   Yes [provider]    Physical Exam: BP 137/88   Pulse (!) 111   Temp 98.3 F (36.8 C)  (Oral)   Resp (!) 22   Ht 5\' 2"  (1.575 m)   Wt 57.8 kg   SpO2 97%   BMI 23.32 kg/m   General: Elderly female. Awake and alert and oriented x3. No acute cardiopulmonary distress.  HEENT: Normocephalic atraumatic.  Right and left ears normal in appearance.  Pupils equal, round, reactive to light. Extraocular muscles are intact. Sclerae anicteric and noninjected.  Moist mucosal membranes. No mucosal lesions.  Neck: Neck supple without lymphadenopathy. No carotid bruits. No masses palpated.  Cardiovascular: Regular rate with normal S1-S2 sounds. No murmurs, rubs, gallops auscultated. No JVD.  Respiratory: Diminished breath sounds.  Rales  throughout.  Mild wheezing no accessory muscle use. Abdomen: Soft, nontender, nondistended. Active bowel sounds. No masses or hepatosplenomegaly  Skin: No rashes, lesions, or ulcerations.  Dry, warm to touch. 2+ dorsalis pedis and radial pulses. Musculoskeletal: No calf or leg pain. All major joints not erythematous nontender.  No upper or lower joint deformation.  Good ROM.  No contractures  Psychiatric: Intact judgment and insight. Pleasant and cooperative. Neurologic: No focal neurological deficits. Strength is 5/5 and symmetric in upper and lower extremities.  Cranial nerves II through XII are grossly intact.           Labs on Admission: I have personally reviewed following labs and imaging studies  CBC: Recent Labs  Lab 05/22/21 1538  WBC 7.9  NEUTROABS 4.8  HGB 10.7*  HCT 32.6*  MCV 98.8  PLT 301   Basic Metabolic Panel: Recent Labs  Lab 05/22/21 1538  NA 133*  K 3.2*  CL 94*  CO2 31  GLUCOSE 130*  BUN 27*  CREATININE 0.82  CALCIUM 8.1*   GFR: Estimated Creatinine Clearance: 30.3 mL/min (by C-G formula based on SCr of 0.82 mg/dL). Liver Function Tests: Recent Labs  Lab 05/22/21 1538  AST 40  ALT 18  ALKPHOS 50  BILITOT 0.9  PROT 6.3*  ALBUMIN 3.2*   No results for input(s): LIPASE, AMYLASE in the last 168 hours. No  results for input(s): AMMONIA in the last 168 hours. Coagulation Profile: No results for input(s): INR, PROTIME in the last 168 hours. Cardiac Enzymes: No results for input(s): CKTOTAL, CKMB, CKMBINDEX, TROPONINI in the last 168 hours. BNP (last 3 results) No results for input(s): PROBNP in the last 8760 hours. HbA1C: No results for input(s): HGBA1C in the last 72 hours. CBG: No results for input(s): GLUCAP in the last 168 hours. Lipid Profile: Recent Labs    05/22/21 1538  TRIG 57   Thyroid Function Tests: No results for input(s): TSH, T4TOTAL, FREET4, T3FREE, THYROIDAB in the last 72 hours. Anemia Panel: Recent Labs    05/22/21 1538  FERRITIN 261   Urine analysis:    Component Value Date/Time   COLORURINE STRAW (A) 02/03/2021 2010   APPEARANCEUR CLEAR 02/03/2021 2010   APPEARANCEUR [place] (A) 06/26/2019 1618   LABSPEC 1.006 02/03/2021 2010   PHURINE 7.0 02/03/2021 2010   GLUCOSEU NEGATIVE 02/03/2021 2010   HGBUR NEGATIVE 02/03/2021 2010   BILIRUBINUR NEGATIVE 02/03/2021 2010   [place] Negative 06/26/2019 [place] 02/03/2021 2010   PROTEINUR NEGATIVE 02/03/2021 2010   UROBILINOGEN negative 04/28/2014 1013   NITRITE NEGATIVE 02/03/2021 2010   LEUKOCYTESUR NEGATIVE 02/03/2021 2010   Sepsis Labs: @LABRCNTIP (procalcitonin:4,lacticidven:4) ) Recent Results (from the past 240 hour(s))  Resp Panel by RT-PCR (Flu A&B, Covid) Nasopharyngeal Swab     Status: Abnormal   Collection Time: 05/22/21  3:54 PM   Specimen: Nasopharyngeal Swab; Nasopharyngeal(NP) swabs in vial transport medium  Result Value Ref Range Status   SARS Coronavirus 2 by RT PCR POSITIVE (A) NEGATIVE Final    Comment: CRITICAL RESULT CALLED TO, READ BACK BY AND VERIFIED WITH: SITES,K 1745 05/22/2021 [place],R (NOTE) SARS-CoV-2 target nucleic acids are DETECTED.  The SARS-CoV-2 RNA is generally detectable in upper respiratory specimens during the acute phase of infection. Positive  results are indicative of the presence of the identified virus, but do not rule out bacterial infection or co-infection with other pathogens not detected by the test. Clinical correlation with patient history and other diagnostic information is necessary to determine patient  infection status. The expected result is Negative.  Fact Sheet for Patients: EntrepreneurPulse.com.au  Fact Sheet for Healthcare Providers: IncredibleEmployment.be  This test is not yet approved or cleared by the [place] FDA and  has been authorized for detection and/or diagnosis of SARS-CoV-2 by FDA under an Emergency Use Authorization (EUA).  This EUA will remain in effect (meaning this te st can be used) for the duration of  the COVID-19 declaration under Section 564(b)(1) of the Act, 21 U.S.C. section 360bbb-3(b)(1), unless the authorization is terminated or revoked sooner.     Influenza A by PCR NEGATIVE NEGATIVE Final   Influenza B by PCR NEGATIVE NEGATIVE Final    Comment: (NOTE) The Xpert Xpress SARS-CoV-2/FLU/RSV plus assay is intended as an aid in the diagnosis of influenza from Nasopharyngeal swab specimens and should not be used as a sole basis for treatment. Nasal washings and aspirates are unacceptable for Xpert Xpress SARS-CoV-2/FLU/RSV testing.  Fact Sheet for Patients: EntrepreneurPulse.com.au  Fact Sheet for Healthcare Providers: IncredibleEmployment.be  This test is not yet approved or cleared by the [place] FDA and has been authorized for detection and/or diagnosis of SARS-CoV-2 by FDA under an Emergency Use Authorization (EUA). This EUA will remain in effect (meaning this test can be used) for the duration of the COVID-19 declaration under Section 564(b)(1) of the Act, 21 U.S.C. section 360bbb-3(b)(1), unless the authorization is terminated or revoked.  Performed at [facility], [address],  [place], [place] [address]      Radiological Exams on Admission: DG Chest [facility] 1 View  Result Date: 05/22/2021 CLINICAL DATA:  Shortness of breath.  Cough. EXAM: PORTABLE CHEST 1 VIEW COMPARISON:  Chest radiograph and lung bases from abdominal CT 12/03/2020 FINDINGS: Left pleural effusion was present on prior exam but increased in size. There may be a small right pleural effusion that was not seen on prior. Stable upper normal heart size. Unchanged mediastinal contours with aortic atherosclerosis and mild bilateral hilar prominence. Calcified granuloma in the left lung. No pulmonary edema. No confluent airspace disease. No pneumothorax. Bones are under mineralized. Chronic deformity of the left proximal humerus. IMPRESSION: Left pleural effusion has increased in size since January 2022. Possible small right pleural effusion, new. Electronically Signed   By: Keith Rake M.D.   On: 05/22/2021 16:18    EKG: Independently reviewed.  Atrial fibrillation with right axis deviation.  No acute ST changes.  Assessment/Plan: Principal Problem:   Pneumonia due to COVID-19 virus Active Problems:   HTN (hypertension)   Hyperlipidemia   Atrial fibrillation (HCC)   CHF (congestive heart failure) (HCC)   Elevated glucose level    This patient was discussed with the ED physician, including pertinent vitals, physical exam findings, labs, and imaging.  We also discussed care given by the ED provider.  Pneumonia secondary to COVID-19 Start steroids Observation Daily labs: CBC, CMP, CRP, D-dimer, ferritin, magnesium, phosphorus Proning Supplemental oxygen Zinc and vitamin C Albuterol HFA as needed Antitussives Hyperglycemia Check hemoglobin A1c Sliding scale insulin coverage Atrial fibrillation Rate controlled Continue anticoagulation Hypertension Continue home regimen Hyperlipidemia CHF Compensated  DVT prophylaxis: On Eliquis Consultants: None Code Status: Full code confirmed by  patient Family Communication: None Disposition Plan: Pending   Truett Mainland, DO

## 2021-05-22 NOTE — ED Triage Notes (Signed)
Pt presents to ED via [facility] for cough. Pt states she has a productive cough. Pt was covid positive on Monday.

## 2021-05-23 DIAGNOSIS — U071 COVID-19: Secondary | ICD-10-CM | POA: Diagnosis not present

## 2021-05-23 DIAGNOSIS — J1282 Pneumonia due to coronavirus disease 2019: Secondary | ICD-10-CM | POA: Diagnosis not present

## 2021-05-23 DIAGNOSIS — L8990 Pressure ulcer of unspecified site, unspecified stage: Secondary | ICD-10-CM | POA: Diagnosis present

## 2021-05-23 LAB — CBC WITH DIFFERENTIAL/PLATELET
Abs Immature Granulocytes: 0.03 10*3/uL (ref 0.00–0.07)
Basophils Absolute: 0 10*3/uL (ref 0.0–0.1)
Basophils Relative: 0 %
Eosinophils Absolute: 0 10*3/uL (ref 0.0–0.5)
Eosinophils Relative: 0 %
HCT: 34.6 % — ABNORMAL LOW (ref 36.0–46.0)
Hemoglobin: 11.2 g/dL — ABNORMAL LOW (ref 12.0–15.0)
Immature Granulocytes: 1 %
Lymphocytes Relative: 27 %
Lymphs Abs: 1.4 10*3/uL (ref 0.7–4.0)
MCH: 31.9 pg (ref 26.0–34.0)
MCHC: 32.4 g/dL (ref 30.0–36.0)
MCV: 98.6 fL (ref 80.0–100.0)
Monocytes Absolute: 0.1 10*3/uL (ref 0.1–1.0)
Monocytes Relative: 3 %
Neutro Abs: 3.5 10*3/uL (ref 1.7–7.7)
Neutrophils Relative %: 69 %
Platelets: 213 10*3/uL (ref 150–400)
RBC: 3.51 MIL/uL — ABNORMAL LOW (ref 3.87–5.11)
RDW: 13.8 % (ref 11.5–15.5)
WBC: 5 10*3/uL (ref 4.0–10.5)
nRBC: 0 % (ref 0.0–0.2)

## 2021-05-23 LAB — HEMOGLOBIN A1C
Hgb A1c MFr Bld: 5.7 % — ABNORMAL HIGH (ref 4.8–5.6)
Mean Plasma Glucose: 116.89 mg/dL

## 2021-05-23 LAB — COMPREHENSIVE METABOLIC PANEL
ALT: 19 U/L (ref 0–44)
AST: 37 U/L (ref 15–41)
Albumin: 3.1 g/dL — ABNORMAL LOW (ref 3.5–5.0)
Alkaline Phosphatase: 52 U/L (ref 38–126)
Anion gap: 13 (ref 5–15)
BUN: 19 mg/dL (ref 8–23)
CO2: 29 mmol/L (ref 22–32)
Calcium: 8.1 mg/dL — ABNORMAL LOW (ref 8.9–10.3)
Chloride: 93 mmol/L — ABNORMAL LOW (ref 98–111)
Creatinine, Ser: 0.65 mg/dL (ref 0.44–1.00)
GFR, Estimated: >60 mL/min (ref >60)
Glucose, Bld: 134 mg/dL — ABNORMAL HIGH (ref 70–99)
Potassium: 3.8 mmol/L (ref 3.5–5.1)
Sodium: 135 mmol/L (ref 135–145)
Total Bilirubin: 0.8 mg/dL (ref 0.3–1.2)
Total Protein: 6.4 g/dL — ABNORMAL LOW (ref 6.5–8.1)

## 2021-05-23 LAB — C-REACTIVE PROTEIN: CRP: 1.6 mg/dL — ABNORMAL HIGH (ref <1.0)

## 2021-05-23 LAB — D-DIMER, QUANTITATIVE: D-Dimer, Quant: 0.65 ug/mL-FEU — ABNORMAL HIGH (ref 0.00–0.50)

## 2021-05-23 MED ORDER — TRIAMCINOLONE ACETONIDE 0.1 % EX OINT
TOPICAL_OINTMENT | Freq: Two times a day (BID) | CUTANEOUS | Status: DC
  Administered 2021-05-23: 1 via TOPICAL
  Filled 2021-05-23: qty 15

## 2021-05-23 MED ORDER — DEXAMETHASONE 6 MG PO TABS: 6.0000 mg | ORAL_TABLET | Freq: Every day | ORAL | 0 refills | Status: AC

## 2021-05-23 MED ORDER — GUAIFENESIN ER 600 MG PO TB12
600.0000 mg | ORAL_TABLET | Freq: Once | ORAL | Status: AC
  Administered 2021-05-23: 600 mg via ORAL
  Filled 2021-05-23: qty 1

## 2021-05-23 MED ORDER — FUROSEMIDE 40 MG PO TABS
40.0000 mg | ORAL_TABLET | Freq: Every day | ORAL | 2 refills | Status: AC
Start: 2021-05-23 — End: ?

## 2021-05-23 MED ORDER — NYSTATIN-TRIAMCINOLONE 100000-0.1 UNIT/GM-% EX OINT
TOPICAL_OINTMENT | Freq: Two times a day (BID) | CUTANEOUS | Status: DC
  Filled 2021-05-23: qty 15

## 2021-05-23 MED ORDER — PANTOPRAZOLE SODIUM 40 MG PO TBEC
40.0000 mg | DELAYED_RELEASE_TABLET | Freq: Every day | ORAL | Status: DC
  Administered 2021-05-23: 40 mg via ORAL
  Filled 2021-05-23: qty 1

## 2021-05-23 MED ORDER — HYDROCOD POLST-CPM POLST ER 10-8 MG/5ML PO SUER: 5.0000 mL | Freq: Once | ORAL | Status: DC

## 2021-05-23 MED ORDER — IPRATROPIUM-ALBUTEROL 20-100 MCG/ACT IN AERS: 1.0000 | INHALATION_SPRAY | Freq: Three times a day (TID) | RESPIRATORY_TRACT | 0 refills | Status: AC

## 2021-05-23 MED ORDER — GUAIFENESIN ER 600 MG PO TB12: 600.0000 mg | ORAL_TABLET | Freq: Two times a day (BID) | ORAL | 0 refills | Status: AC

## 2021-05-23 MED ORDER — PANTOPRAZOLE SODIUM 40 MG PO TBEC: 40.0000 mg | DELAYED_RELEASE_TABLET | Freq: Every day | ORAL | 5 refills | Status: AC

## 2021-05-23 MED ORDER — NYSTATIN-TRIAMCINOLONE 100000-0.1 UNIT/GM-% EX OINT: 1.0000 "application " | TOPICAL_OINTMENT | Freq: Two times a day (BID) | CUTANEOUS | 0 refills | Status: AC

## 2021-05-23 MED ORDER — NYSTATIN 100000 UNIT/GM EX OINT
TOPICAL_OINTMENT | Freq: Two times a day (BID) | CUTANEOUS | Status: DC
  Filled 2021-05-23: qty 15

## 2021-05-23 MED ORDER — ASCORBIC ACID 500 MG PO TABS: 500.0000 mg | ORAL_TABLET | Freq: Every day | ORAL | 2 refills | Status: AC

## 2021-05-23 MED ORDER — HYDROCODONE BIT-HOMATROP MBR 5-1.5 MG/5ML PO SOLN: 5.0000 mL | Freq: Four times a day (QID) | ORAL | 0 refills | Status: DC | PRN

## 2021-05-23 MED ORDER — ZINC SULFATE 220 (50 ZN) MG PO CAPS: 220.0000 mg | ORAL_CAPSULE | Freq: Every day | ORAL | 2 refills | Status: AC

## 2021-05-23 MED ORDER — NYSTATIN 100000 UNIT/GM EX POWD
Freq: Two times a day (BID) | CUTANEOUS | Status: DC
  Administered 2021-05-23: 1 via TOPICAL
  Filled 2021-05-23: qty 15

## 2021-05-23 MED ORDER — ONDANSETRON HCL 4 MG PO TABS: 4.0000 mg | ORAL_TABLET | Freq: Four times a day (QID) | ORAL | 0 refills | Status: AC | PRN

## 2021-05-23 MED ORDER — HYDROCODONE BIT-HOMATROP MBR 5-1.5 MG/5ML PO SOLN
5.0000 mL | Freq: Four times a day (QID) | ORAL | Status: DC | PRN
  Administered 2021-05-23: 5 mL via ORAL
  Filled 2021-05-23: qty 5

## 2021-05-23 MED ORDER — HYDROCODONE BIT-HOMATROP MBR 5-1.5 MG/5ML PO SOLN: 5.0000 mL | Freq: Two times a day (BID) | ORAL | 0 refills | Status: AC | PRN

## 2021-05-23 NOTE — Progress Notes (Signed)
Patient and family stated that she is not a Diabetic and they do not want her placed on Diabetic measures. I messaged doctor before he left and did not get a response but I will let day shift know.

## 2021-05-23 NOTE — Progress Notes (Signed)
Pt has congested cough, episodic. Thick, clear mucous expectorated. Pt denies any SOB, SaO2 100% on 2 lpm [place]. HR up with coughing spell, but at rest is now down to 95 -100 bpm. Pt denies any other c/o at present.

## 2021-05-23 NOTE — Progress Notes (Signed)
Pt discharged via WC to POV with O2 @ 3lpm [place]. Pt's family brought her [employer] O2 compressor from home for transport in car. Discharge instructions given to pt, her daughter and her grandson, who are her caregivers. All stated understanding of discharge information.

## 2021-05-23 NOTE — Discharge Summary (Signed)
Paige Ferguson, is a 87 y.o. female  DOB 12-01-1920  MRN [id number].  Admission date:  05/22/2021  Admitting Physician  Truett Mainland, DO  Discharge Date:  05/23/2021   Primary MD  Sharion Balloon, FNP  Recommendations for primary care physician for things to follow:   -1) you are taking apixaban/Eliquis which is your blood thinner so please Avoid ibuprofen/Advil/Aleve/Motrin/Goody Powders/Naproxen/BC powders/Meloxicam/Diclofenac/Indomethacin and other Nonsteroidal anti-inflammatory medications as these will make you more likely to bleed and can cause stomach ulcers, can also cause Kidney problems.   2)You are strongly advised to isolate/quarantine for at least 10 days from the date of your diagnosis with COVID-19 infection--please always wear a mask if you have to go outside the house  3)Please take medications as prescribed, STOP Meloxicam   4)Video/Virtual follow-up visit with primary care physician in about a week advised  Admission Diagnosis  COVID-19 virus infection [U07.1] Pneumonia due to COVID-19 virus [U07.1, J12.82]   Discharge Diagnosis  COVID-19 virus infection [U07.1] Pneumonia due to COVID-19 virus [U07.1, J12.82]    Principal Problem:   Pneumonia due to COVID-19 virus Active Problems:   HTN (hypertension)   Atrial fibrillation (HCC)   CHF (congestive heart failure) ([place])   Single kidney   Hyperlipidemia   On home oxygen therapy   Elevated glucose level   Pressure injury of skin      Past Medical History:  Diagnosis Date   Allergy    Arthritis of knee    Cataract    Cholelithiases    Chronic kidney disease    Endometrial polyp 7/98   2 degree polyps    Genu valgum    Gout    Hyperlipidemia    Hypertension    Left knee pain    Osteoarthritis    Osteoporosis    Postmenopausal bleeding    Recurrent UTI    Renal abscess, right    SCCA (squamous cell carcinoma) of skin  01/25/2021   Right Lower Back (in situ)(CX35FU)   SCCA (squamous cell carcinoma) of skin 01/25/2021   Left Flank (in situ)(CX35FU)   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 Dolores Lory    RIGHT HEMINEPHRECTOMY FOR ABSCESS  2/98   RIGHT NEPHRELITHOTOMY  1982       HPI  from the history and physical done on the day of admission:     Chief Complaint:  SOB, COVID   HPI: Paige Ferguson is a 87 y.o. female  with a history of hypertension, CHF, hyperlipidemia, gout, osteoarthritis, TIA, chronic kidney disease.  Patient tested positive on 6/27.  Had a cough that started 3 days prior to that.  She has other family members who tested positive for COVID as well.  She called her primary doctor who started her on Paxlovid.  She has completed the course and still feels short of breath, especially with activity.  She does wear oxygen all the time and denies increased oxygen demand.  She does have a cough that is nonproductive.   Emergency Department Course: White count normal.  CRP elevated at 1.7.  Procalcitonin 1.12.  Creatinine normal.   Review of Systems:     Hospital Course:      1)Covid Infection--- patient is vaccinated and boosted, her daughter and grandson also have COVID -PTA patient was on 3 L of oxygen baseline, here patient is requiring 2 to 3 L of oxygen -Patient completed Paxlovid PTA -Her primary concern is cough, OTC medications not effective for cough--- prescription of hydrocodan given -Discharged on as needed bronchodilators, mucolytics  and dexamethasone -  2) chronic atrial fibrillation--continue Eliquis for stroke prophylaxis and Coreg for rate control  3)HLD--continue Crestor  4)HTN--discharged on amlodipine, benazepril and Coreg  5) hyperglycemia--- could worsen with steroids patient and daughter declined Amaryl and glipizide with meals - 6) candidal intertrigo--- underneath both breast, chaperone RN Crystal present during exam -Treat with Mycolog ointment  7)HFpEF--history of chronic diastolic dysfunction CHF, echo from 02/04/2021 with EF of 55 to 60% -Clinically appears compensated at this time -Continue Coreg and Lasix  Discharge Condition: Stable, oxygen requirement is at baseline  Follow UP--PCP video visit    Consults obtained - na  Diet and Activity recommendation:  As advised  Discharge Instructions    Discharge Instructions     Call MD for:  difficulty breathing, headache or visual disturbances   Complete by: As directed    Call MD for:  persistant dizziness or light-headedness   Complete by: As directed    Call MD for:  persistant nausea and vomiting   Complete by: As directed    Call MD for:  severe uncontrolled pain   Complete by: As directed    Call MD for:  temperature >100.4   Complete by: As directed    Diet - low sodium heart healthy   Complete by: As directed    Discharge instructions   Complete by: As directed    -1) you are taking apixaban/Eliquis which is your blood thinner so please Avoid ibuprofen/Advil/Aleve/Motrin/Goody Powders/Naproxen/BC powders/Meloxicam/Diclofenac/Indomethacin and other Nonsteroidal anti-inflammatory medications as these will make you more likely to bleed and can cause stomach ulcers, can also cause Kidney problems.   2)You are strongly advised to isolate/quarantine for at least 10 days from the date of your diagnosis with COVID-19 infection--please always wear a mask if you have to go outside the house  3)Please take medications as prescribed, STOP  Meloxicam   4)Video/Virtual follow-up visit with primary care physician in about a week advised   Discharge wound care:   Complete by: As directed    As advised   Increase activity slowly   Complete by: As directed          Discharge Medications     Allergies as of 05/23/2021       Reactions   Sulfa Antibiotics Nausea And Vomiting   Amoxicillin Other (See Comments)   Unknown reaction   Nitrofurantoin Monohyd Macro Other (See Comments)   Unknown  reaction        Medication List     STOP taking these medications    meloxicam 7.5 MG tablet Commonly known as: MOBIC   molnupiravir EUA 200 mg Caps       TAKE these medications    amLODipine 5 MG tablet Commonly known as: NORVASC Take 1 tablet (5 mg total) by mouth daily.   apixaban 2.5 MG Tabs tablet Commonly known as: Eliquis Take 1 tablet (2.5 mg total) by mouth 2 (two) times daily.   ascorbic acid 500 MG tablet Commonly known as: VITAMIN C Take 1 tablet (500 mg total) by mouth daily. Start taking on: May 24, 2021   Benadryl Itch Stopping cream Generic drug: diphenhydrAMINE-zinc acetate Apply topically 3 (three) times daily as needed for itching.   benazepril 40 MG tablet Commonly known as: LOTENSIN Take 1 tablet (40 mg total) by mouth daily.   CALCIUM 1200+D3 PO Take 1 tablet by mouth daily.   carvedilol 6.25 MG tablet Commonly known as: COREG Take 1 tablet (6.25 mg total) by mouth 2 (two) times daily with a meal.   Centrum Silver Adult 50+ Tabs Take 1 tablet by mouth daily.   dexamethasone 6 MG tablet Commonly known as: DECADRON Take 1 tablet (6 mg total) by mouth daily with breakfast.   Fish Oil 1000 MG Caps Take 1 capsule by mouth 2 (two) times daily.   furosemide 40 MG tablet Commonly known as: LASIX Take 1 tablet (40 mg total) by mouth daily. What changed: when to take this   GNP Glucosame Chondroitin DS Tabs Take 1 tablet by mouth daily.   guaiFENesin 600 MG 12 hr  tablet Commonly known as: Mucinex Take 1 tablet (600 mg total) by mouth 2 (two) times daily for 10 days.   HYDROcodone bit-homatropine 5-1.5 MG/5ML syrup Commonly known as: HYCODAN Take 5 mLs by mouth every 12 (twelve) hours as needed for cough.   Ipratropium-Albuterol 20-100 MCG/ACT Aers respimat Commonly known as: COMBIVENT Inhale 1 puff into the lungs 3 (three) times daily.   levETIRAcetam 250 MG tablet Commonly known as: KEPPRA Take 1 tablet (250 mg total) by mouth 2 (two) times daily.   nystatin-triamcinolone ointment Commonly known as: MYCOLOG Apply 1 application topically 2 (two) times daily. Apply to area underneath Breast Twice Daily for Yeast/Fungal Related Rash   ondansetron 4 MG tablet Commonly known as: ZOFRAN Take 1 tablet (4 mg total) by mouth every 6 (six) hours as needed for nausea.   pantoprazole 40 MG tablet Commonly known as: PROTONIX Take 1 tablet (40 mg total) by mouth daily. Start taking on: May 24, 2021 What changed:  medication strength how much to take   raloxifene 60 MG tablet Commonly known as: EVISTA Take 1 tablet (60 mg total) by mouth daily.   rosuvastatin 5 MG tablet Commonly known as: CRESTOR Take 0.5 tablets (2.5 mg total) by mouth 3 (three) times a week. What changed: additional instructions   terbinafine 1 % cream Commonly known as: LamISIL AT Apply 1 application topically 2 (two) times daily.   vitamin E 200 UNIT capsule Take 200 Units by mouth daily.   zinc sulfate 220 (50 Zn) MG capsule Take 1 capsule (220 mg total) by mouth daily. Start taking on: May 24, 2021       ASK your doctor about these medications    benzonatate 200 MG capsule Commonly known as: TESSALON Take 1 capsule (200 mg total) by mouth 3 (three) times daily as needed.  Discharge Care Instructions  (From admission, onward)           Start     Ordered   05/23/21 0000  Discharge wound care:       Comments: As advised    05/23/21 1325            Major procedures and Radiology Reports - PLEASE review detailed and final reports for all details, in brief -   DG Chest Port 1 View  Result Date: 05/22/2021 CLINICAL DATA:  Shortness of breath.  Cough. EXAM: PORTABLE CHEST 1 VIEW COMPARISON:  Chest radiograph and lung bases from abdominal CT 12/03/2020 FINDINGS: Left pleural effusion was present on prior exam but increased in size. There may be a small right pleural effusion that was not seen on prior. Stable upper normal heart size. Unchanged mediastinal contours with aortic atherosclerosis and mild bilateral hilar prominence. Calcified granuloma in the left lung. No pulmonary edema. No confluent airspace disease. No pneumothorax. Bones are under mineralized. Chronic deformity of the left proximal humerus. IMPRESSION: Left pleural effusion has increased in size since January 2022. Possible small right pleural effusion, new. Electronically Signed   By: Keith Rake M.D.   On: 05/22/2021 16:18    Micro Results   Recent Results (from the past 240 hour(s))  Resp Panel by RT-PCR (Flu A&B, Covid) Nasopharyngeal Swab     Status: Abnormal   Collection Time: 05/22/21  3:54 PM   Specimen: Nasopharyngeal Swab; Nasopharyngeal(NP) swabs in vial transport medium  Result Value Ref Range Status   SARS Coronavirus 2 by RT PCR POSITIVE (A) NEGATIVE Final    Comment: CRITICAL RESULT CALLED TO, READ BACK BY AND VERIFIED WITH: SITES,K 1745 05/22/2021 COLEMAN,R (NOTE) SARS-CoV-2 target nucleic acids are DETECTED.  The SARS-CoV-2 RNA is generally detectable in upper respiratory specimens during the acute phase of infection. Positive results are indicative of the presence of the identified virus, but do not rule out bacterial infection or co-infection with other pathogens not detected by the test. Clinical correlation with patient history and other diagnostic information is necessary to determine patient infection status. The  expected result is Negative.  Fact Sheet for Patients: EntrepreneurPulse.com.au  Fact Sheet for Healthcare Providers: IncredibleEmployment.be  This test is not yet approved or cleared by the [place] FDA and  has been authorized for detection and/or diagnosis of SARS-CoV-2 by FDA under an Emergency Use Authorization (EUA).  This EUA will remain in effect (meaning this te st can be used) for the duration of  the COVID-19 declaration under Section 564(b)(1) of the Act, 21 U.S.C. section 360bbb-3(b)(1), unless the authorization is terminated or revoked sooner.     Influenza A by PCR NEGATIVE NEGATIVE Final   Influenza B by PCR NEGATIVE NEGATIVE Final    Comment: (NOTE) The Xpert Xpress SARS-CoV-2/FLU/RSV plus assay is intended as an aid in the diagnosis of influenza from Nasopharyngeal swab specimens and should not be used as a sole basis for treatment. Nasal washings and aspirates are unacceptable for Xpert Xpress SARS-CoV-2/FLU/RSV testing.  Fact Sheet for Patients: EntrepreneurPulse.com.au  Fact Sheet for Healthcare Providers: IncredibleEmployment.be  This test is not yet approved or cleared by the [place] FDA and has been authorized for detection and/or diagnosis of SARS-CoV-2 by FDA under an Emergency Use Authorization (EUA). This EUA will remain in effect (meaning this test can be used) for the duration of the COVID-19 declaration under Section 564(b)(1) of the Act, 21 U.S.C. section 360bbb-3(b)(1), unless the authorization is terminated  or revoked.  Performed at [facility], [address], [place], [place] [address]        Today   Subjective    Paige Ferguson today has no new complaints, hydrocodan helpful for cough - -Patient and her daughter declines treatment for hyperglycemia       Patient has been seen and examined prior to discharge   Objective   Blood pressure 132/76,  pulse 98, temperature 98 F (36.7 C), temperature source Oral, resp. rate 18, height 5\' 2"  (1.575 m), weight 57.8 kg, SpO2 99 %.   Intake/Output Summary (Last 24 hours) at 05/23/2021 1346 Last data filed at 05/23/2021 0900 Gross per 24 hour  Intake 360 ml  Output 1050 ml  Net -690 ml    Exam Gen:- Awake Alert, no acute distress , speaking in sentences HEENT:- [place].AT, No sclera icterus, HOH Nose- [place] 2 L/min Neck-Supple Neck,No JVD,.  Lungs-slightly diminished breath sounds, no significant wheezing  CV- S1, S2 normal, regular Abd-  +ve B.Sounds, Abd Soft, No tenderness,    Extremity/Skin:- No  edema,   good pulses Psych-affect is appropriate, oriented x3, very very sharp for her age Neuro-no new focal deficits, no tremors  Skin- underneath both breast, chaperone RN Crystal present during exam   Data Review   CBC w Diff:  Lab Results  Component Value Date   WBC 5.0 05/23/2021   HGB 11.2 (L) 05/23/2021   HGB 11.5 02/02/2021   HCT 34.6 (L) 05/23/2021   HCT 34.2 02/02/2021   PLT 213 05/23/2021   PLT 235 02/02/2021   LYMPHOPCT 27 05/23/2021   MONOPCT 3 05/23/2021   EOSPCT 0 05/23/2021   BASOPCT 0 05/23/2021    CMP:  Lab Results  Component Value Date   NA 135 05/23/2021   NA 140 02/02/2021   K 3.8 05/23/2021   CL 93 (L) 05/23/2021   CO2 29 05/23/2021   BUN 19 05/23/2021   BUN 29 02/02/2021   CREATININE 0.65 05/23/2021   CREATININE 1.02 06/05/2013   PROT 6.4 (L) 05/23/2021   PROT 6.3 02/02/2021   ALBUMIN 3.1 (L) 05/23/2021   ALBUMIN 3.7 02/02/2021   BILITOT 0.8 05/23/2021   BILITOT 0.5 02/02/2021   ALKPHOS 52 05/23/2021   AST 37 05/23/2021   ALT 19 05/23/2021  .   Total Discharge time is about 33 minutes  Roxan Hockey M.D on 05/23/2021 at 1:46 PM  Go to [email or web address] -  for contact info  [facility] - Office  [phone]

## 2021-05-23 NOTE — TOC Transition Note (Signed)
Transition of Care [facility]) - CM/SW Discharge Note   Patient Details  Name: Paige Ferguson MRN: [id number] Date of Birth: [birth year]  [facility] CM/SW Contact:  Natasha Bence, LCSW Phone Number: 05/23/2021, 2:45 PM   Clinical Narrative:    CSW notified of patient's readiness for discharge and [place] Needs. CSW referred patient to [place] with [facility]. Cindy agreeable to provide [facility]. TOC signing off.    Final next level of care: [place] Barriers to Discharge: Barriers Resolved   Patient Goals and CMS Choice Patient states their goals for this hospitalization and ongoing recovery are:: Return home with [facility] CMS Medicare.gov Compare Post Acute Care list provided to:: Patient Choice offered to / list presented to : Patient  Discharge Placement                    Patient and family notified of of transfer: 05/23/21  Discharge Plan and Services                          HH Arranged: PT [facility] Agency: [place] Date [place]: 05/23/21 Time [place]: 4536 Representative spoke with at [place]: [place] ([place]) Interventions     Readmission Risk Interventions No flowsheet data found.

## 2021-05-23 NOTE — Discharge Instructions (Signed)
-  1) you are taking apixaban/Eliquis which is your blood thinner so please Avoid ibuprofen/Advil/Aleve/Motrin/Goody Powders/Naproxen/BC powders/Meloxicam/Diclofenac/Indomethacin and other Nonsteroidal anti-inflammatory medications as these will make you more likely to bleed and can cause stomach ulcers, can also cause Kidney problems.   2)You are strongly advised to isolate/quarantine for at least 10 days from the date of your diagnosis with COVID-19 infection--please always wear a mask if you have to go outside the house  3)Please take medications as prescribed, STOP Meloxicam   4)Video/Virtual follow-up visit with primary care physician in about a week advised

## 2021-05-24 ENCOUNTER — Telehealth: Payer: Self-pay

## 2021-05-24 NOTE — Telephone Encounter (Signed)
Transition Care Management Follow-up Telephone Call Date of discharge and from where: [facility] 05/23/21 Diagnosis: covid pneumonia How have you been since you were released from the hospital? Cough has improved. She just feels weak and overall not feeling well. No appetite. Any questions or concerns? No  Items Reviewed: Did the pt receive and understand the discharge instructions provided? Yes  Medications obtained and verified? Yes  Other? No  Any new allergies since your discharge? No  Dietary orders reviewed? Yes Do you have support at home? Yes   Home Care and Equipment/Supplies: Were home health services ordered? yes If so, what is the name of the agency? [facility]  Has the agency set up a time to come to the patient's home? no Were any new equipment or medical supplies ordered?  No What is the name of the medical supply agency? N/a Were you able to get the supplies/equipment? not applicable Do you have any questions related to the use of the equipment or supplies? No  Functional Questionnaire: (I = Independent and D = Dependent) ADLs: D  Bathing/Dressing- D  Meal Prep- D  Eating- I  Maintaining continence- I  Transferring/Ambulation- D  Managing Meds- D  Follow up appointments reviewed:  PCP Hospital f/u appt confirmed? Yes  Scheduled to see Evelina Dun on 06/06/21 @ 3. [facility] f/u appt confirmed? No   Are transportation arrangements needed? No  If their condition worsens, is the pt aware to call PCP or go to the Emergency Dept.? Yes Was the patient provided with contact information for the PCP's office or ED? Yes Was to pt encouraged to call back with questions or concerns? Yes

## 2021-05-26 DIAGNOSIS — N189 Chronic kidney disease, unspecified: Secondary | ICD-10-CM | POA: Diagnosis not present

## 2021-05-26 DIAGNOSIS — U071 COVID-19: Secondary | ICD-10-CM | POA: Diagnosis not present

## 2021-05-26 DIAGNOSIS — J1282 Pneumonia due to coronavirus disease 2019: Secondary | ICD-10-CM | POA: Diagnosis not present

## 2021-05-26 DIAGNOSIS — I130 Hypertensive heart and chronic kidney disease with heart failure and stage 1 through stage 4 chronic kidney disease, or unspecified chronic kidney disease: Secondary | ICD-10-CM | POA: Diagnosis not present

## 2021-05-26 DIAGNOSIS — I5032 Chronic diastolic (congestive) heart failure: Secondary | ICD-10-CM | POA: Diagnosis not present

## 2021-05-27 LAB — CULTURE, BLOOD (ROUTINE X 2)
Culture: NO GROWTH
Culture: NO GROWTH

## 2021-05-30 ENCOUNTER — Telehealth: Payer: Self-pay | Admitting: Family

## 2021-05-30 ENCOUNTER — Other Ambulatory Visit: Payer: Self-pay | Admitting: Family

## 2021-05-30 NOTE — Telephone Encounter (Signed)
Please schedule her a telephone visit with me tomorrow.

## 2021-05-30 NOTE — Telephone Encounter (Signed)
In hospital 7/3 with COVID please advise

## 2021-05-30 NOTE — Telephone Encounter (Signed)
Appt made patient aware  °

## 2021-05-31 ENCOUNTER — Encounter: Payer: Self-pay | Admitting: Family

## 2021-05-31 ENCOUNTER — Ambulatory Visit (INDEPENDENT_AMBULATORY_CARE_PROVIDER_SITE_OTHER): Payer: Medicare Other | Admitting: Family

## 2021-05-31 DIAGNOSIS — J1282 Pneumonia due to coronavirus disease 2019: Secondary | ICD-10-CM

## 2021-05-31 DIAGNOSIS — U071 COVID-19: Secondary | ICD-10-CM

## 2021-05-31 DIAGNOSIS — Z09 Encounter for follow-up examination after completed treatment for conditions other than malignant neoplasm: Secondary | ICD-10-CM

## 2021-05-31 DIAGNOSIS — R059 Cough, unspecified: Secondary | ICD-10-CM

## 2021-05-31 MED ORDER — ALBUTEROL SULFATE (2.5 MG/3ML) 0.083% IN NEBU: 2.5000 mg | INHALATION_SOLUTION | Freq: Four times a day (QID) | RESPIRATORY_TRACT | 1 refills | Status: AC | PRN

## 2021-05-31 MED ORDER — DOXYCYCLINE HYCLATE 100 MG PO TABS: 100.0000 mg | ORAL_TABLET | Freq: Two times a day (BID) | ORAL | 0 refills | Status: AC

## 2021-05-31 NOTE — Progress Notes (Signed)
Virtual Visit  Note Due to COVID-19 pandemic this visit was conducted virtually. This visit type was conducted due to national recommendations for restrictions regarding the COVID-19 Pandemic (e.g. social distancing, sheltering in place) in an effort to limit this patient's exposure and mitigate transmission in our community. All issues noted in this document were discussed and addressed.  A physical exam was not performed with this format.  I connected with Paige Ferguson on 05/31/21 at 9:52 AM  by telephone and verified that I am speaking with the correct person using two identifiers. Paige Ferguson is currently located at home and her grandson is currently with her during visit. The provider, Evelina Dun, FNP is located in their office at time of visit.  I discussed the limitations, risks, security and privacy concerns of performing an evaluation and management service by telephone and the availability of in person appointments. I also discussed with the patient that there may be a patient responsible charge related to this service. The patient expressed understanding and agreed to proceed.   History and Present Illness:  She had COVID 05/17/21 and was given molnupiravir. She went to the ED on 05/22/21 with weakness and SOB. She was diagnosed with COVID pneumonia. She is currently on 3 L of O2.   She has home health coming to her home.   She was given hydrocodan cough syrup. That has not helped. She was discharged on bronchodilators, myocytics, and dexamethasone.  Cough This is a recurrent problem. The current episode started 1 to 4 weeks ago. The problem has been waxing and waning. The problem occurs every few minutes. The cough is Non-productive. Associated symptoms include nasal congestion, shortness of breath and wheezing. Pertinent negatives include no chills, ear congestion or ear pain. She has tried rest, OTC cough suppressant and OTC inhaler for the symptoms. The treatment provided  mild relief.     Review of Systems  Constitutional:  Negative for chills.  HENT:  Negative for ear pain.   Respiratory:  Positive for cough, shortness of breath and wheezing.     Observations/Objective: Weakness and SOB noted, intermittent cough noted.   Assessment and Plan: 1. Pneumonia due to COVID-19 virus - For home use only DME Nebulizer machine - albuterol (PROVENTIL) (2.5 MG/3ML) 0.083% nebulizer solution; Take 3 mLs (2.5 mg total) by nebulization every 6 (six) hours as needed for wheezing or shortness of breath.  Dispense: 150 mL; Refill: 1  2. COVID-19 virus detected - For home use only DME Nebulizer machine - albuterol (PROVENTIL) (2.5 MG/3ML) 0.083% nebulizer solution; Take 3 mLs (2.5 mg total) by nebulization every 6 (six) hours as needed for wheezing or shortness of breath.  Dispense: 150 mL; Refill: 1  3. Cough - For home use only DME Nebulizer machine - albuterol (PROVENTIL) (2.5 MG/3ML) 0.083% nebulizer solution; Take 3 mLs (2.5 mg total) by nebulization every 6 (six) hours as needed for wheezing or shortness of breath.  Dispense: 150 mL; Refill: 1 - doxycycline (VIBRA-TABS) 100 MG tablet; Take 1 tablet (100 mg total) by mouth 2 (two) times daily.  Dispense: 20 tablet; Refill: 0  4. Hospital discharge follow-up  Start doxycyline today Albuterol neb ordered Continue OTC cough syrup  If any weakness and SOB worsen go to ED. Family aware.     I discussed the assessment and treatment plan with the patient. The patient was provided an opportunity to ask questions and all were answered. The patient agreed with the plan and demonstrated an understanding  of the instructions.   The patient was advised to call back or seek an in-person evaluation if the symptoms worsen or if the condition fails to improve as anticipated.  The above assessment and management plan was discussed with the patient. The patient verbalized understanding of and has agreed to the management plan.  Patient is aware to call the clinic if symptoms persist or worsen. Patient is aware when to return to the clinic for a follow-up visit. Patient educated on when it is appropriate to go to the emergency department.   Time call ended:  10:14 pm   I provided 22 minutes of  non face-to-face time during this encounter.    Evelina Dun, FNP

## 2021-06-01 DIAGNOSIS — I5032 Chronic diastolic (congestive) heart failure: Secondary | ICD-10-CM | POA: Diagnosis not present

## 2021-06-01 DIAGNOSIS — U071 COVID-19: Secondary | ICD-10-CM | POA: Diagnosis not present

## 2021-06-01 DIAGNOSIS — N189 Chronic kidney disease, unspecified: Secondary | ICD-10-CM | POA: Diagnosis not present

## 2021-06-01 DIAGNOSIS — I130 Hypertensive heart and chronic kidney disease with heart failure and stage 1 through stage 4 chronic kidney disease, or unspecified chronic kidney disease: Secondary | ICD-10-CM | POA: Diagnosis not present

## 2021-06-01 DIAGNOSIS — J1282 Pneumonia due to coronavirus disease 2019: Secondary | ICD-10-CM | POA: Diagnosis not present

## 2021-06-02 DIAGNOSIS — I509 Heart failure, unspecified: Secondary | ICD-10-CM | POA: Diagnosis not present

## 2021-06-02 DIAGNOSIS — R0602 Shortness of breath: Secondary | ICD-10-CM | POA: Diagnosis not present

## 2021-06-03 DIAGNOSIS — I5032 Chronic diastolic (congestive) heart failure: Secondary | ICD-10-CM | POA: Diagnosis not present

## 2021-06-03 DIAGNOSIS — N189 Chronic kidney disease, unspecified: Secondary | ICD-10-CM | POA: Diagnosis not present

## 2021-06-03 DIAGNOSIS — I130 Hypertensive heart and chronic kidney disease with heart failure and stage 1 through stage 4 chronic kidney disease, or unspecified chronic kidney disease: Secondary | ICD-10-CM | POA: Diagnosis not present

## 2021-06-03 DIAGNOSIS — J1282 Pneumonia due to coronavirus disease 2019: Secondary | ICD-10-CM | POA: Diagnosis not present

## 2021-06-03 DIAGNOSIS — L89322 Pressure ulcer of left buttock, stage 2: Secondary | ICD-10-CM | POA: Diagnosis not present

## 2021-06-03 DIAGNOSIS — U071 COVID-19: Secondary | ICD-10-CM | POA: Diagnosis not present

## 2021-06-06 ENCOUNTER — Inpatient Hospital Stay: Payer: Medicare Other | Admitting: Family

## 2021-06-06 DIAGNOSIS — N189 Chronic kidney disease, unspecified: Secondary | ICD-10-CM | POA: Diagnosis not present

## 2021-06-06 DIAGNOSIS — I130 Hypertensive heart and chronic kidney disease with heart failure and stage 1 through stage 4 chronic kidney disease, or unspecified chronic kidney disease: Secondary | ICD-10-CM | POA: Diagnosis not present

## 2021-06-06 DIAGNOSIS — J1282 Pneumonia due to coronavirus disease 2019: Secondary | ICD-10-CM | POA: Diagnosis not present

## 2021-06-06 DIAGNOSIS — U071 COVID-19: Secondary | ICD-10-CM | POA: Diagnosis not present

## 2021-06-06 DIAGNOSIS — I5032 Chronic diastolic (congestive) heart failure: Secondary | ICD-10-CM | POA: Diagnosis not present

## 2021-06-07 ENCOUNTER — Telehealth: Payer: Self-pay | Admitting: Family

## 2021-06-07 DIAGNOSIS — J1282 Pneumonia due to coronavirus disease 2019: Secondary | ICD-10-CM | POA: Diagnosis not present

## 2021-06-07 DIAGNOSIS — U071 COVID-19: Secondary | ICD-10-CM | POA: Diagnosis not present

## 2021-06-07 DIAGNOSIS — I130 Hypertensive heart and chronic kidney disease with heart failure and stage 1 through stage 4 chronic kidney disease, or unspecified chronic kidney disease: Secondary | ICD-10-CM | POA: Diagnosis not present

## 2021-06-07 DIAGNOSIS — I5032 Chronic diastolic (congestive) heart failure: Secondary | ICD-10-CM | POA: Diagnosis not present

## 2021-06-07 DIAGNOSIS — N189 Chronic kidney disease, unspecified: Secondary | ICD-10-CM | POA: Diagnosis not present

## 2021-06-07 NOTE — Telephone Encounter (Signed)
Spoke to grandson. Appointment rescheduled 2021/07/14     2:25pm     MyChart Video Visit Evelina Dun, FNP

## 2021-06-08 ENCOUNTER — Ambulatory Visit (INDEPENDENT_AMBULATORY_CARE_PROVIDER_SITE_OTHER): Payer: Medicare Other

## 2021-06-08 ENCOUNTER — Other Ambulatory Visit: Payer: Self-pay

## 2021-06-08 DIAGNOSIS — M1030 Gout due to renal impairment, unspecified site: Secondary | ICD-10-CM | POA: Diagnosis not present

## 2021-06-08 DIAGNOSIS — Z9181 History of falling: Secondary | ICD-10-CM

## 2021-06-08 DIAGNOSIS — U071 COVID-19: Secondary | ICD-10-CM

## 2021-06-08 DIAGNOSIS — Z7901 Long term (current) use of anticoagulants: Secondary | ICD-10-CM

## 2021-06-08 DIAGNOSIS — I130 Hypertensive heart and chronic kidney disease with heart failure and stage 1 through stage 4 chronic kidney disease, or unspecified chronic kidney disease: Secondary | ICD-10-CM

## 2021-06-08 DIAGNOSIS — Z87440 Personal history of urinary (tract) infections: Secondary | ICD-10-CM

## 2021-06-08 DIAGNOSIS — C44602 Unspecified malignant neoplasm of skin of right upper limb, including shoulder: Secondary | ICD-10-CM

## 2021-06-08 DIAGNOSIS — I482 Chronic atrial fibrillation, unspecified: Secondary | ICD-10-CM | POA: Diagnosis not present

## 2021-06-08 DIAGNOSIS — M810 Age-related osteoporosis without current pathological fracture: Secondary | ICD-10-CM | POA: Diagnosis not present

## 2021-06-08 DIAGNOSIS — I5032 Chronic diastolic (congestive) heart failure: Secondary | ICD-10-CM | POA: Diagnosis not present

## 2021-06-08 DIAGNOSIS — J1282 Pneumonia due to coronavirus disease 2019: Secondary | ICD-10-CM | POA: Diagnosis not present

## 2021-06-08 DIAGNOSIS — N189 Chronic kidney disease, unspecified: Secondary | ICD-10-CM | POA: Diagnosis not present

## 2021-06-08 DIAGNOSIS — M1710 Unilateral primary osteoarthritis, unspecified knee: Secondary | ICD-10-CM

## 2021-06-08 DIAGNOSIS — K8020 Calculus of gallbladder without cholecystitis without obstruction: Secondary | ICD-10-CM | POA: Diagnosis not present

## 2021-06-08 DIAGNOSIS — Z7952 Long term (current) use of systemic steroids: Secondary | ICD-10-CM

## 2021-06-08 DIAGNOSIS — E785 Hyperlipidemia, unspecified: Secondary | ICD-10-CM

## 2021-06-08 DIAGNOSIS — Z8673 Personal history of transient ischemic attack (TIA), and cerebral infarction without residual deficits: Secondary | ICD-10-CM

## 2021-06-09 ENCOUNTER — Telehealth: Payer: Self-pay | Admitting: Family

## 2021-06-09 DIAGNOSIS — I5032 Chronic diastolic (congestive) heart failure: Secondary | ICD-10-CM | POA: Diagnosis not present

## 2021-06-09 DIAGNOSIS — I130 Hypertensive heart and chronic kidney disease with heart failure and stage 1 through stage 4 chronic kidney disease, or unspecified chronic kidney disease: Secondary | ICD-10-CM | POA: Diagnosis not present

## 2021-06-09 DIAGNOSIS — U071 COVID-19: Secondary | ICD-10-CM | POA: Diagnosis not present

## 2021-06-09 DIAGNOSIS — J1282 Pneumonia due to coronavirus disease 2019: Secondary | ICD-10-CM | POA: Diagnosis not present

## 2021-06-09 DIAGNOSIS — N189 Chronic kidney disease, unspecified: Secondary | ICD-10-CM | POA: Diagnosis not present

## 2021-06-11 DIAGNOSIS — R0602 Shortness of breath: Secondary | ICD-10-CM | POA: Diagnosis not present

## 2021-06-11 DIAGNOSIS — I509 Heart failure, unspecified: Secondary | ICD-10-CM | POA: Diagnosis not present

## 2021-06-11 DIAGNOSIS — R609 Edema, unspecified: Secondary | ICD-10-CM | POA: Diagnosis not present

## 2021-06-13 ENCOUNTER — Other Ambulatory Visit: Payer: Self-pay

## 2021-06-13 ENCOUNTER — Emergency Department (HOSPITAL_COMMUNITY)
Admission: EM | Admit: 2021-06-13 | Discharge: 2021-06-20 | Disposition: E | Payer: Medicare Other | Attending: Emergency Medicine | Admitting: Emergency Medicine

## 2021-06-13 ENCOUNTER — Encounter (HOSPITAL_COMMUNITY): Payer: Self-pay | Admitting: Emergency Medicine

## 2021-06-13 DIAGNOSIS — R404 Transient alteration of awareness: Secondary | ICD-10-CM | POA: Diagnosis not present

## 2021-06-13 DIAGNOSIS — Z8616 Personal history of COVID-19: Secondary | ICD-10-CM | POA: Insufficient documentation

## 2021-06-13 DIAGNOSIS — I959 Hypotension, unspecified: Secondary | ICD-10-CM

## 2021-06-13 DIAGNOSIS — Z85820 Personal history of malignant melanoma of skin: Secondary | ICD-10-CM | POA: Insufficient documentation

## 2021-06-13 DIAGNOSIS — Z87891 Personal history of nicotine dependence: Secondary | ICD-10-CM | POA: Diagnosis not present

## 2021-06-13 DIAGNOSIS — M25562 Pain in left knee: Secondary | ICD-10-CM | POA: Diagnosis not present

## 2021-06-13 DIAGNOSIS — I130 Hypertensive heart and chronic kidney disease with heart failure and stage 1 through stage 4 chronic kidney disease, or unspecified chronic kidney disease: Secondary | ICD-10-CM | POA: Insufficient documentation

## 2021-06-13 DIAGNOSIS — F0390 Unspecified dementia without behavioral disturbance: Secondary | ICD-10-CM | POA: Diagnosis not present

## 2021-06-13 DIAGNOSIS — R079 Chest pain, unspecified: Secondary | ICD-10-CM | POA: Diagnosis not present

## 2021-06-13 DIAGNOSIS — I509 Heart failure, unspecified: Secondary | ICD-10-CM | POA: Insufficient documentation

## 2021-06-13 DIAGNOSIS — N189 Chronic kidney disease, unspecified: Secondary | ICD-10-CM | POA: Insufficient documentation

## 2021-06-13 DIAGNOSIS — E876 Hypokalemia: Secondary | ICD-10-CM

## 2021-06-13 DIAGNOSIS — W19XXXA Unspecified fall, initial encounter: Secondary | ICD-10-CM | POA: Insufficient documentation

## 2021-06-13 DIAGNOSIS — Y92009 Unspecified place in unspecified non-institutional (private) residence as the place of occurrence of the external cause: Secondary | ICD-10-CM | POA: Diagnosis not present

## 2021-06-13 DIAGNOSIS — R6889 Other general symptoms and signs: Secondary | ICD-10-CM | POA: Diagnosis not present

## 2021-06-13 DIAGNOSIS — D649 Anemia, unspecified: Secondary | ICD-10-CM | POA: Diagnosis not present

## 2021-06-13 DIAGNOSIS — Z743 Need for continuous supervision: Secondary | ICD-10-CM | POA: Diagnosis not present

## 2021-06-13 DIAGNOSIS — I499 Cardiac arrhythmia, unspecified: Secondary | ICD-10-CM | POA: Diagnosis not present

## 2021-06-13 DIAGNOSIS — R110 Nausea: Secondary | ICD-10-CM | POA: Insufficient documentation

## 2021-06-13 NOTE — Telephone Encounter (Signed)
Written script placed on providers desk

## 2021-06-13 NOTE — ED Triage Notes (Signed)
Per family pt's left knee gave out earlier today and pt has not been using it. Pt also flopped back while they were changing her tonight ? Seizure activity.

## 2021-06-14 ENCOUNTER — Telehealth: Payer: Medicare Other | Admitting: Family

## 2021-06-14 ENCOUNTER — Emergency Department (HOSPITAL_COMMUNITY): Payer: Medicare Other

## 2021-06-14 DIAGNOSIS — I959 Hypotension, unspecified: Secondary | ICD-10-CM | POA: Diagnosis not present

## 2021-06-14 DIAGNOSIS — E876 Hypokalemia: Secondary | ICD-10-CM | POA: Diagnosis not present

## 2021-06-14 DIAGNOSIS — D649 Anemia, unspecified: Secondary | ICD-10-CM | POA: Diagnosis not present

## 2021-06-14 LAB — CBC WITH DIFFERENTIAL/PLATELET
Abs Immature Granulocytes: 0.08 10*3/uL — ABNORMAL HIGH (ref 0.00–0.07)
Basophils Absolute: 0 10*3/uL (ref 0.0–0.1)
Basophils Relative: 0 %
Eosinophils Absolute: 0 10*3/uL (ref 0.0–0.5)
Eosinophils Relative: 0 %
HCT: 25.7 % — ABNORMAL LOW (ref 36.0–46.0)
Hemoglobin: 8.4 g/dL — ABNORMAL LOW (ref 12.0–15.0)
Immature Granulocytes: 1 %
Lymphocytes Relative: 12 %
Lymphs Abs: 1.1 10*3/uL (ref 0.7–4.0)
MCH: 34.3 pg — ABNORMAL HIGH (ref 26.0–34.0)
MCHC: 32.7 g/dL (ref 30.0–36.0)
MCV: 104.9 fL — ABNORMAL HIGH (ref 80.0–100.0)
Monocytes Absolute: 1.3 10*3/uL — ABNORMAL HIGH (ref 0.1–1.0)
Monocytes Relative: 13 %
Neutro Abs: 7 10*3/uL (ref 1.7–7.7)
Neutrophils Relative %: 74 %
Platelets: 221 10*3/uL (ref 150–400)
RBC: 2.45 MIL/uL — ABNORMAL LOW (ref 3.87–5.11)
RDW: 18.8 % — ABNORMAL HIGH (ref 11.5–15.5)
WBC: 9.4 10*3/uL (ref 4.0–10.5)
nRBC: 0.2 % (ref 0.0–0.2)

## 2021-06-14 LAB — BASIC METABOLIC PANEL
Anion gap: 9 (ref 5–15)
BUN: 30 mg/dL — ABNORMAL HIGH (ref 8–23)
CO2: 31 mmol/L (ref 22–32)
Calcium: 8.4 mg/dL — ABNORMAL LOW (ref 8.9–10.3)
Chloride: 101 mmol/L (ref 98–111)
Creatinine, Ser: 1.06 mg/dL — ABNORMAL HIGH (ref 0.44–1.00)
GFR, Estimated: 47 mL/min — ABNORMAL LOW (ref >60)
Glucose, Bld: 187 mg/dL — ABNORMAL HIGH (ref 70–99)
Potassium: 3.4 mmol/L — ABNORMAL LOW (ref 3.5–5.1)
Sodium: 141 mmol/L (ref 135–145)

## 2021-06-14 LAB — TYPE AND SCREEN

## 2021-06-14 MED ORDER — SODIUM CHLORIDE 0.9 % IV BOLUS
1000.0000 mL | Freq: Once | INTRAVENOUS | Status: AC
  Administered 2021-06-14: 1000 mL via INTRAVENOUS

## 2021-06-14 MED ORDER — FENTANYL CITRATE (PF) 100 MCG/2ML IJ SOLN
50.0000 ug | Freq: Once | INTRAMUSCULAR | Status: AC
  Administered 2021-06-14: 50 ug via INTRAVENOUS
  Filled 2021-06-14: qty 2

## 2021-06-14 MED ORDER — ONDANSETRON HCL 4 MG/2ML IJ SOLN
4.0000 mg | Freq: Once | INTRAMUSCULAR | Status: AC
  Administered 2021-06-14: 4 mg via INTRAVENOUS
  Filled 2021-06-14: qty 2

## 2021-06-20 NOTE — ED Provider Notes (Signed)
[facility] Provider Note   CSN: [id number] Arrival date & time: 05/25/2021  2341     History Chief Complaint  Patient presents with   Knee Pain    Paige Ferguson is a 87 y.o. female.  The history is provided by the EMS personnel. The history is limited by the condition of the patient (Dementia).  Knee Pain She has history of hypertension, hyperlipidemia, atrial fibrillation anticoagulated on apixaban, heart failure and is brought in by ambulance after her left knee gave out on her at home.  Family relates that she is not using her left leg since then.  Patient just states that "everything is wrong".  She is complaining of some nausea.   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   Right Upper Arm Anterior   Shoulder fracture, left    Skin cancer 01/25/2021   Right upper arm- anterior -sebaceous carinoma ([facility])   TIA (transient ischemic attack)    Ureteral calculi    URI (upper respiratory infection)     Patient Active Problem List   Diagnosis Date Noted   Pressure injury of skin 05/23/2021   Pneumonia due to COVID-19 virus 05/22/2021   Elevated glucose level 05/22/2021   Ringworm 11/26/2020   On home oxygen therapy 09/19/2019   Atrial fibrillation ([place]) 06/26/2019   CHF (congestive heart failure) ([place]) 06/26/2019   Pressure injury of toe of left foot, stage 1 04/02/2018   Obesity (BMI 30-39.9) 0000000   Metabolic syndrome [id number]    Fissure in ano 03/09/2014   Hemorrhoid 03/09/2014   TIA (transient ischemic attack) 12/23/2013   Hyperlipidemia    Renal abscess, right    Osteoarthritis    Postmenopausal bleeding    Ureteral calculi    Recurrent UTI    Single kidney 06/05/2013   HTN (hypertension) 02/28/2013   Gout 02/28/2013   Osteoporosis 02/28/2013   Arthritis of knee, left 02/28/2013   Genu valgum (acquired) 02/28/2013   Endometrial polyp 05/20/1997    Past Surgical History:  Procedure Laterality Date   EYE SURGERY     HERNIA REPAIR     HYSTEROSCOPY , FRACTIONAL D&C  7/98/   [place]   LEFT CATARACT SURGERY  10/99   RIGHT CATARACT SURGERY  7/99   Dr. Dolores Lory    RIGHT HEMINEPHRECTOMY FOR ABSCESS  2/98   RIGHT [place]     OB History   No obstetric history on file.     Family History  Problem Relation Age of Onset   Stroke Maternal Grandmother    Hypertension Maternal Grandmother    Diabetes Maternal Grandmother    Pneumonia Father    Coronary artery disease Paternal Grandfather    Heart defect Son     Social History   Tobacco Use  Smoking status: Former    Types: Cigarettes    Quit date: 11/20/1944    Years since quitting: 76.6   Smokeless tobacco: Never  Vaping Use   Vaping Use: Never used  Substance Use Topics   Alcohol use: No   Drug use: No    Home Medications Prior to Admission medications   Medication Sig Start Date End Date Taking? Authorizing Provider  albuterol (PROVENTIL) (2.5 MG/3ML) 0.083% nebulizer solution Take 3 mLs (2.5 mg total) by nebulization every 6 (six) hours as needed for wheezing or shortness of breath. 05/31/21   Evelina Dun A, FNP  amLODipine (NORVASC) 5 MG tablet Take 1 tablet (5 mg total) by mouth daily. 10/19/20   Sharion Balloon, FNP  apixaban (ELIQUIS) 2.5 MG TABS tablet Take 1 tablet (2.5 mg total) by mouth 2 (two) times daily. 02/04/21   Johnson, Clanford L, MD  ascorbic acid (VITAMIN C) 500 MG tablet Take 1  tablet (500 mg total) by mouth daily. 05/24/21   Roxan Hockey, MD  benazepril (LOTENSIN) 40 MG tablet Take 1 tablet (40 mg total) by mouth daily. 10/19/20   Evelina Dun A, FNP  benzonatate (TESSALON) 200 MG capsule Take 1 capsule (200 mg total) by mouth 3 (three) times daily as needed. Patient taking differently: Take 200 mg by mouth 3 (three) times daily as needed for cough. 05/20/21   Sharion Balloon, FNP  Calcium-Magnesium-Vitamin D (CALCIUM 1200+D3 PO) Take 1 tablet by mouth daily.    [provider]  carvedilol (COREG) 6.25 MG tablet Take 1 tablet (6.25 mg total) by mouth 2 (two) times daily with a meal. 10/19/20   Hawks, Alyse Low A, FNP  dexamethasone (DECADRON) 6 MG tablet Take 1 tablet (6 mg total) by mouth daily with breakfast. 05/23/21   Roxan Hockey, MD  diphenhydrAMINE-zinc acetate (BENADRYL ITCH STOPPING) cream Apply topically 3 (three) times daily as needed for itching. 11/26/20   Ivy Lynn, NP  doxycycline (VIBRA-TABS) 100 MG tablet Take 1 tablet (100 mg total) by mouth 2 (two) times daily. 05/31/21   Sharion Balloon, FNP  furosemide (LASIX) 40 MG tablet Take 1 tablet (40 mg total) by mouth daily. 05/23/21   Roxan Hockey, MD  HYDROcodone bit-homatropine (HYCODAN) 5-1.5 MG/5ML syrup Take 5 mLs by mouth every 12 (twelve) hours as needed for cough. 05/23/21   Roxan Hockey, MD  Ipratropium-Albuterol (COMBIVENT) 20-100 MCG/ACT AERS respimat Inhale 1 puff into the lungs 3 (three) times daily. 05/23/21   Roxan Hockey, MD  levETIRAcetam (KEPPRA) 250 MG tablet Take 1 tablet (250 mg total) by mouth 2 (two) times daily. 02/05/21   Johnson, Eldridge Dace, MD  Misc Natural Products (GNP GLUCOSAME CHONDROITIN DS) TABS Take 1 tablet by mouth daily.    [provider]  Multiple Vitamins-Minerals (CENTRUM SILVER ADULT 50+) TABS Take 1 tablet by mouth daily.    [provider]  nystatin-triamcinolone ointment (MYCOLOG) Apply 1 application topically 2 (two) times  daily. Apply to area underneath Breast Twice Daily for Yeast/Fungal Related Rash 05/23/21   Roxan Hockey, MD  Omega-3 Fatty Acids (FISH OIL) 1000 MG CAPS Take 1 capsule by mouth 2 (two) times daily.    [provider]  ondansetron (ZOFRAN) 4 MG tablet Take 1 tablet (4 mg total) by mouth every 6 (six) hours as needed for nausea. 05/23/21   Roxan Hockey, MD  pantoprazole (PROTONIX) 40 MG tablet Take 1 tablet (40 mg total) by mouth daily. 05/24/21   Roxan Hockey, MD  raloxifene (  EVISTA) 60 MG tablet Take 1 tablet (60 mg total) by mouth daily. 10/19/20   Sharion Balloon, FNP  rosuvastatin (CRESTOR) 5 MG tablet Take 0.5 tablets (2.5 mg total) by mouth 3 (three) times a week. Patient taking differently: Take 2.5 mg by mouth 3 (three) times a week. Jory Sims, Fri 04/20/21 05/22/21  Evelina Dun A, FNP  terbinafine (LAMISIL AT) 1 % cream Apply 1 application topically 2 (two) times daily. 11/26/20   Ivy Lynn, NP  vitamin E 200 UNIT capsule Take 200 Units by mouth daily.    [provider]  zinc sulfate 220 (50 Zn) MG capsule Take 1 capsule (220 mg total) by mouth daily. 05/24/21   Roxan Hockey, MD    Allergies    Sulfa antibiotics, Amoxicillin, and Nitrofurantoin monohyd macro  Review of Systems   Review of Systems  Unable to perform ROS: Dementia   Physical Exam Updated Vital Signs BP (!) 79/52   Pulse (!) 114   Temp 98 F (36.7 C) (Oral)   Resp 19   Ht '5\' 2"'$  (1.575 m)   Wt 57 kg   SpO2 99%   BMI 22.98 kg/m   Physical Exam Vitals and nursing note reviewed.  87 year old female, resting comfortably and in no acute distress. Vital signs are significant for low blood pressure and elevated heart rate. Oxygen saturation is 99%, which is normal. Head is normocephalic and atraumatic. PERRLA, EOMI. Oropharynx is clear. Neck is nontender and supple without adenopathy or JVD. Back is nontender and there is no CVA tenderness. Lungs are clear without rales, wheezes, or  rhonchi. Chest is nontender. Heart has an irregular rhythm without murmur. Abdomen is soft, flat, nontender without masses or hepatosplenomegaly and peristalsis is normoactive. Extremities: Immobilizers are present on both knees.  There is moderate left knee effusion with tenderness to palpation.  There is also tenderness palpation over the left hip.  There is pain on passive range of motion of the left leg but not of the right leg. Skin is warm and dry without rash. Neurologic: Awake and alert, cranial nerves are intact, resists movement of her left leg but moves other extremities equally.  ED Results / Procedures / Treatments   Labs (all labs ordered are listed, but only abnormal results are displayed) Labs Reviewed  BASIC METABOLIC PANEL - Abnormal; Notable for the following components:      Result Value   Potassium 3.4 (*)    Glucose, Bld 187 (*)    BUN 30 (*)    Creatinine, Ser 1.06 (*)    Calcium 8.4 (*)    GFR, Estimated 47 (*)    All other components within normal limits  CBC WITH DIFFERENTIAL/PLATELET - Abnormal; Notable for the following components:   RBC 2.45 (*)    Hemoglobin 8.4 (*)    HCT 25.7 (*)    MCV 104.9 (*)    MCH 34.3 (*)    RDW 18.8 (*)    Monocytes Absolute 1.3 (*)    Abs Immature Granulocytes 0.08 (*)    All other components within normal limits  TYPE AND SCREEN   Radiology No results found.  Procedures Procedures  CRITICAL CARE Performed by: Delora Fuel Total critical care time: 35 minutes Critical care time was exclusive of separately billable procedures and treating other patients. Critical care was necessary to treat or prevent imminent or life-threatening deterioration. Critical care was time spent personally by me on the following activities: development of treatment plan  with patient and/or surrogate as well as nursing, discussions with consultants, evaluation of patient's response to treatment, examination of patient, obtaining history from  patient or surrogate, ordering and performing treatments and interventions, ordering and review of laboratory studies, ordering and review of radiographic studies, pulse oximetry and re-evaluation of patient's condition.  Medications Ordered in ED Medications  sodium chloride 0.9 % bolus 1,000 mL (has no administration in time range)  ondansetron (ZOFRAN) injection 4 mg (has no administration in time range)  fentaNYL (SUBLIMAZE) injection 50 mcg (has no administration in time range)    ED Course  I have reviewed the triage vital signs and the nursing notes.  Pertinent labs & imaging results that were available during my care of the patient were reviewed by me and considered in my medical decision making (see chart for details).   MDM Rules/Calculators/A&P                         Injury to left leg, x-rays ordered of left knee and left hip.  Hypotension, patient being given IV fluids.  Chronic atrial fibrillation. Old records are reviewed, and she was hospitalized 3 weeks ago for COVID-19 virus infection.  1:01 AM Blood pressure has dropped to 50 systolic.  Patient's daughter is at the bedside.  After brief discussion, decision was made to let the patient be DNR, comfort care only.  1:30 AM Patient is apneic and pulseless, pronounced dead.  Family is at the bedside.  Labs have come back showing mild hypokalemia, significant drop in hemoglobin compared with 7/4.  I suspect that this had a lot to do with her demise.  Cause of death is felt to be underlying heart failure with complication of anemia which is probably related to blood loss in a patient who is chronically anticoagulated.  Final Clinical Impression(s) / ED Diagnoses Final diagnoses:  Fall in home, initial encounter  Acute on chronic anemia  Hypokalemia  Hypotension, unspecified hypotension type    Rx / DC Orders ED Discharge Orders     None        Delora Fuel, [place] [id number] [phone]

## 2021-06-20 NOTE — Telephone Encounter (Signed)
Closing encounter, pt passed away in ED today before I was able to fax order

## 2021-06-20 DEATH — deceased

## 2021-07-08 ENCOUNTER — Telehealth: Payer: Self-pay | Admitting: Cardiovascular Disease

## 2021-07-08 NOTE — Telephone Encounter (Signed)
New Message:      Daughter called and wanted Dr Angelena Form and his nurse know that the patient passed away on July 07, 2021.

## 2021-07-08 NOTE — Telephone Encounter (Signed)
Will send this message to Dr. Angelena Form and RN as a general FYI, upon return to the office.

## 2021-07-14 ENCOUNTER — Ambulatory Visit: Payer: Medicare Other | Admitting: Cardiovascular Disease

## 2021-08-30 ENCOUNTER — Ambulatory Visit: Payer: Medicare Other | Admitting: Physician Assistant

## 2021-11-30 NOTE — Telephone Encounter (Signed)
Will close encounter
